# Patient Record
Sex: Female | Born: 1945 | Race: White | Hispanic: No | Smoking: Never smoker
Health system: Southern US, Community
[De-identification: ages and names within clinical notes are randomized; demographics above are authoritative.]

## PROBLEM LIST (undated history)

## (undated) ENCOUNTER — Inpatient Hospital Stay: Admission: EM | Payer: Self-pay | Source: Home / Self Care

## (undated) DIAGNOSIS — N390 Urinary tract infection, site not specified: Secondary | ICD-10-CM

## (undated) DIAGNOSIS — D649 Anemia, unspecified: Secondary | ICD-10-CM

## (undated) DIAGNOSIS — M5136 Other intervertebral disc degeneration, lumbar region: Secondary | ICD-10-CM

## (undated) DIAGNOSIS — R519 Headache, unspecified: Secondary | ICD-10-CM

## (undated) DIAGNOSIS — T8859XA Other complications of anesthesia, initial encounter: Secondary | ICD-10-CM

## (undated) DIAGNOSIS — F329 Major depressive disorder, single episode, unspecified: Secondary | ICD-10-CM

## (undated) DIAGNOSIS — Z9289 Personal history of other medical treatment: Secondary | ICD-10-CM

## (undated) DIAGNOSIS — M1990 Unspecified osteoarthritis, unspecified site: Secondary | ICD-10-CM

## (undated) DIAGNOSIS — K3184 Gastroparesis: Secondary | ICD-10-CM

## (undated) DIAGNOSIS — M4800 Spinal stenosis, site unspecified: Secondary | ICD-10-CM

## (undated) DIAGNOSIS — I10 Essential (primary) hypertension: Secondary | ICD-10-CM

## (undated) DIAGNOSIS — G709 Myoneural disorder, unspecified: Secondary | ICD-10-CM

## (undated) DIAGNOSIS — K219 Gastro-esophageal reflux disease without esophagitis: Secondary | ICD-10-CM

## (undated) DIAGNOSIS — E785 Hyperlipidemia, unspecified: Secondary | ICD-10-CM

## (undated) DIAGNOSIS — Z9889 Other specified postprocedural states: Secondary | ICD-10-CM

## (undated) DIAGNOSIS — G629 Polyneuropathy, unspecified: Secondary | ICD-10-CM

## (undated) DIAGNOSIS — T4145XA Adverse effect of unspecified anesthetic, initial encounter: Secondary | ICD-10-CM

## (undated) DIAGNOSIS — R262 Difficulty in walking, not elsewhere classified: Secondary | ICD-10-CM

## (undated) DIAGNOSIS — M51369 Other intervertebral disc degeneration, lumbar region without mention of lumbar back pain or lower extremity pain: Secondary | ICD-10-CM

## (undated) DIAGNOSIS — R51 Headache: Secondary | ICD-10-CM

## (undated) DIAGNOSIS — Z8719 Personal history of other diseases of the digestive system: Secondary | ICD-10-CM

## (undated) DIAGNOSIS — C801 Malignant (primary) neoplasm, unspecified: Secondary | ICD-10-CM

## (undated) DIAGNOSIS — R0602 Shortness of breath: Secondary | ICD-10-CM

## (undated) DIAGNOSIS — I639 Cerebral infarction, unspecified: Secondary | ICD-10-CM

## (undated) DIAGNOSIS — F32A Depression, unspecified: Secondary | ICD-10-CM

## (undated) DIAGNOSIS — R112 Nausea with vomiting, unspecified: Secondary | ICD-10-CM

## (undated) HISTORY — PX: EYE SURGERY: SHX253

## (undated) HISTORY — PX: KNEE SURGERY: SHX244

## (undated) HISTORY — PX: STAPEDECTOMY: SHX2435

## (undated) HISTORY — PX: UPPER GASTROINTESTINAL ENDOSCOPY: SHX188

## (undated) HISTORY — PX: ABDOMINAL HYSTERECTOMY: SHX81

## (undated) HISTORY — DX: Cerebral infarction, unspecified: I63.9

## (undated) HISTORY — PX: COLONOSCOPY: SHX174

## (undated) HISTORY — DX: Gastro-esophageal reflux disease without esophagitis: K21.9

## (undated) HISTORY — DX: Polyneuropathy, unspecified: G62.9

---

## 1997-05-29 ENCOUNTER — Other Ambulatory Visit: Admission: RE | Admit: 1997-05-29 | Discharge: 1997-05-29 | Payer: Self-pay | Admitting: Obstetrics and Gynecology

## 1997-08-26 ENCOUNTER — Ambulatory Visit (HOSPITAL_COMMUNITY): Admission: RE | Admit: 1997-08-26 | Discharge: 1997-08-26 | Payer: Self-pay | Admitting: Gastroenterology

## 1997-10-04 ENCOUNTER — Ambulatory Visit (HOSPITAL_COMMUNITY): Admission: RE | Admit: 1997-10-04 | Discharge: 1997-10-04 | Payer: Self-pay | Admitting: Family Medicine

## 1997-11-06 ENCOUNTER — Ambulatory Visit (HOSPITAL_BASED_OUTPATIENT_CLINIC_OR_DEPARTMENT_OTHER): Admission: RE | Admit: 1997-11-06 | Discharge: 1997-11-06 | Payer: Self-pay | Admitting: Orthopedic Surgery

## 1998-05-07 ENCOUNTER — Other Ambulatory Visit: Admission: RE | Admit: 1998-05-07 | Discharge: 1998-05-07 | Payer: Self-pay | Admitting: Obstetrics and Gynecology

## 1999-05-14 ENCOUNTER — Encounter: Payer: Self-pay | Admitting: Obstetrics and Gynecology

## 1999-05-14 ENCOUNTER — Encounter: Admission: RE | Admit: 1999-05-14 | Discharge: 1999-05-14 | Payer: Self-pay | Admitting: Obstetrics and Gynecology

## 2001-05-09 ENCOUNTER — Other Ambulatory Visit: Admission: RE | Admit: 2001-05-09 | Discharge: 2001-05-09 | Payer: Self-pay | Admitting: Obstetrics and Gynecology

## 2001-10-20 ENCOUNTER — Encounter (HOSPITAL_COMMUNITY): Admission: RE | Admit: 2001-10-20 | Discharge: 2001-11-19 | Payer: Self-pay | Admitting: Orthopedic Surgery

## 2001-11-17 ENCOUNTER — Encounter: Payer: Self-pay | Admitting: Orthopedic Surgery

## 2001-11-23 ENCOUNTER — Encounter: Payer: Self-pay | Admitting: Orthopedic Surgery

## 2001-11-23 ENCOUNTER — Encounter (HOSPITAL_COMMUNITY): Admission: RE | Admit: 2001-11-23 | Discharge: 2001-12-23 | Payer: Self-pay | Admitting: Orthopedic Surgery

## 2002-07-19 ENCOUNTER — Other Ambulatory Visit: Admission: RE | Admit: 2002-07-19 | Discharge: 2002-07-19 | Payer: Self-pay | Admitting: Obstetrics and Gynecology

## 2003-01-26 HISTORY — PX: JOINT REPLACEMENT: SHX530

## 2004-09-29 ENCOUNTER — Other Ambulatory Visit: Admission: RE | Admit: 2004-09-29 | Discharge: 2004-09-29 | Payer: Self-pay | Admitting: Obstetrics and Gynecology

## 2004-11-12 ENCOUNTER — Ambulatory Visit: Payer: Self-pay | Admitting: Orthopedic Surgery

## 2004-12-30 ENCOUNTER — Ambulatory Visit (HOSPITAL_COMMUNITY): Admission: RE | Admit: 2004-12-30 | Discharge: 2004-12-30 | Payer: Self-pay | Admitting: Pulmonary Disease

## 2007-04-10 ENCOUNTER — Emergency Department (HOSPITAL_COMMUNITY): Admission: EM | Admit: 2007-04-10 | Discharge: 2007-04-10 | Payer: Self-pay | Admitting: Emergency Medicine

## 2008-12-18 DIAGNOSIS — I10 Essential (primary) hypertension: Secondary | ICD-10-CM

## 2008-12-23 ENCOUNTER — Ambulatory Visit: Payer: Self-pay | Admitting: Orthopedic Surgery

## 2008-12-23 DIAGNOSIS — E119 Type 2 diabetes mellitus without complications: Secondary | ICD-10-CM

## 2008-12-23 DIAGNOSIS — M545 Low back pain: Secondary | ICD-10-CM

## 2008-12-23 DIAGNOSIS — M4800 Spinal stenosis, site unspecified: Secondary | ICD-10-CM

## 2008-12-23 DIAGNOSIS — M5430 Sciatica, unspecified side: Secondary | ICD-10-CM

## 2008-12-26 ENCOUNTER — Telehealth: Payer: Self-pay | Admitting: Orthopedic Surgery

## 2008-12-27 ENCOUNTER — Ambulatory Visit (HOSPITAL_COMMUNITY): Admission: RE | Admit: 2008-12-27 | Discharge: 2008-12-27 | Payer: Self-pay | Admitting: Orthopedic Surgery

## 2009-01-06 ENCOUNTER — Ambulatory Visit: Payer: Self-pay | Admitting: Orthopedic Surgery

## 2009-01-15 ENCOUNTER — Telehealth: Payer: Self-pay | Admitting: Orthopedic Surgery

## 2009-02-12 ENCOUNTER — Encounter: Payer: Self-pay | Admitting: Orthopedic Surgery

## 2009-02-25 HISTORY — PX: BACK SURGERY: SHX140

## 2009-02-27 ENCOUNTER — Inpatient Hospital Stay (HOSPITAL_COMMUNITY): Admission: RE | Admit: 2009-02-27 | Discharge: 2009-03-03 | Payer: Self-pay | Admitting: Neurosurgery

## 2009-03-31 ENCOUNTER — Encounter: Payer: Self-pay | Admitting: Orthopedic Surgery

## 2009-04-04 ENCOUNTER — Ambulatory Visit (HOSPITAL_COMMUNITY): Admission: RE | Admit: 2009-04-04 | Discharge: 2009-04-04 | Payer: Self-pay | Admitting: Pulmonary Disease

## 2009-05-07 ENCOUNTER — Encounter: Payer: Self-pay | Admitting: Orthopedic Surgery

## 2009-06-18 ENCOUNTER — Encounter: Payer: Self-pay | Admitting: Orthopedic Surgery

## 2009-07-02 ENCOUNTER — Ambulatory Visit (HOSPITAL_COMMUNITY): Admission: RE | Admit: 2009-07-02 | Discharge: 2009-07-02 | Payer: Self-pay | Admitting: Pulmonary Disease

## 2009-07-31 ENCOUNTER — Ambulatory Visit (HOSPITAL_COMMUNITY)
Admission: RE | Admit: 2009-07-31 | Discharge: 2009-07-31 | Payer: Self-pay | Source: Home / Self Care | Admitting: Pulmonary Disease

## 2009-09-22 ENCOUNTER — Encounter: Payer: Self-pay | Admitting: Orthopedic Surgery

## 2010-01-22 ENCOUNTER — Inpatient Hospital Stay (HOSPITAL_COMMUNITY)
Admission: EM | Admit: 2010-01-22 | Discharge: 2010-01-27 | Payer: Self-pay | Source: Home / Self Care | Attending: Pulmonary Disease | Admitting: Pulmonary Disease

## 2010-02-02 ENCOUNTER — Ambulatory Visit (HOSPITAL_COMMUNITY)
Admission: RE | Admit: 2010-02-02 | Discharge: 2010-02-02 | Payer: Self-pay | Source: Home / Self Care | Attending: Pulmonary Disease | Admitting: Pulmonary Disease

## 2010-02-06 ENCOUNTER — Encounter (HOSPITAL_COMMUNITY)
Admission: RE | Admit: 2010-02-06 | Discharge: 2010-02-24 | Payer: Self-pay | Source: Home / Self Care | Attending: Pulmonary Disease | Admitting: Pulmonary Disease

## 2010-02-24 NOTE — Letter (Signed)
Summary: [facility] office note Dr Leitha Bleak office note Dr Venetia Maxon   Imported By: Cammie Sickle 06/09/2009 08:54:08  _____________________________________________________________________  External Attachment:    Type:   Image     Comment:   External Document

## 2010-02-24 NOTE — Letter (Signed)
Summary: [facility] Office note Dr Leitha Bleak Office note Dr Venetia Maxon   Imported By: Cammie Sickle 10/15/2009 08:52:28  _____________________________________________________________________  External Attachment:    Type:   Image     Comment:   External Document

## 2010-02-24 NOTE — Consult Note (Signed)
Summary: Consult notes from Dr. Venetia Maxon  Consult notes from Dr. Venetia Maxon   Imported By: Jacklynn Ganong 04/09/2009 08:20:15  _____________________________________________________________________  External Attachment:    Type:   Image     Comment:   External Document

## 2010-02-24 NOTE — Letter (Signed)
Summary: [facility] office note Dr Leitha Bleak office note Dr Venetia Maxon   Imported By: Cammie Sickle 07/08/2009 09:04:49  _____________________________________________________________________  External Attachment:    Type:   Image     Comment:   External Document

## 2010-02-24 NOTE — Consult Note (Signed)
Summary: Consult Rpt [facility] Dr Venetia Maxon  Consult Rpt [facility] Dr Venetia Maxon   Imported By: Cammie Sickle 03/05/2009 19:24:31  _____________________________________________________________________  External Attachment:    Type:   Image     Comment:   External Document

## 2010-02-25 ENCOUNTER — Ambulatory Visit: Admit: 2010-02-25 | Payer: Self-pay | Admitting: Internal Medicine

## 2010-02-25 ENCOUNTER — Ambulatory Visit (INDEPENDENT_AMBULATORY_CARE_PROVIDER_SITE_OTHER): Payer: Medicare Other | Admitting: Internal Medicine

## 2010-02-25 DIAGNOSIS — R110 Nausea: Secondary | ICD-10-CM

## 2010-03-04 ENCOUNTER — Ambulatory Visit (HOSPITAL_COMMUNITY)
Admission: RE | Admit: 2010-03-04 | Discharge: 2010-03-04 | Disposition: A | Discharge status: Home / Self Care | Payer: Medicare Other | Source: Ambulatory Visit | Attending: Internal Medicine | Admitting: Internal Medicine

## 2010-03-04 ENCOUNTER — Encounter (HOSPITAL_BASED_OUTPATIENT_CLINIC_OR_DEPARTMENT_OTHER): Payer: Medicare Other | Admitting: Internal Medicine

## 2010-03-04 DIAGNOSIS — Z79899 Other long term (current) drug therapy: Secondary | ICD-10-CM | POA: Insufficient documentation

## 2010-03-04 DIAGNOSIS — R112 Nausea with vomiting, unspecified: Secondary | ICD-10-CM

## 2010-03-04 DIAGNOSIS — I10 Essential (primary) hypertension: Secondary | ICD-10-CM | POA: Insufficient documentation

## 2010-03-04 DIAGNOSIS — E119 Type 2 diabetes mellitus without complications: Secondary | ICD-10-CM | POA: Insufficient documentation

## 2010-03-04 DIAGNOSIS — E785 Hyperlipidemia, unspecified: Secondary | ICD-10-CM | POA: Insufficient documentation

## 2010-03-04 LAB — GLUCOSE, CAPILLARY: Glucose-Capillary: 147 mg/dL — ABNORMAL HIGH (ref 70–99)

## 2010-03-09 NOTE — Op Note (Signed)
  NAME:  Lori Mahoney, Lori Mahoney                 ACCOUNT NO.:  [id number]  MEDICAL RECORD NO.:  [id number]           PATIENT TYPE:  O  LOCATION:  DAYP                          FACILITY:  [facility]  PHYSICIAN:  Lionel December, M.D.    DATE OF BIRTH:  November 17, 1945  DATE OF PROCEDURE:  03/04/2010 DATE OF DISCHARGE:                              OPERATIVE REPORT   PROCEDURE:  Esophagogastroduodenoscopy.  INDICATION:  [name] is a 65 year old Caucasian female with history of diabetes mellitus, who has been having recurrent nausea and vomiting for about 6 weeks.  She was briefly hospitalized in December for dehydration and electrolyte abnormalities.  She has had negative ultrasound and negative HIDA with a CCK.  She was seen by Dr. Lovell Sheehan, who felt that she did not need to have her gallbladder removed.  She is undergoing diagnostic EGD.  Procedures were reviewed with the patient.  Informed consent was obtained.  MEDS FOR CONSCIOUS SEDATION:  Cetacaine spray for oropharyngeal topical anesthesia, Versed 4 mg IV, fentanyl 50 mcg IV.  FINDINGS:  Procedure performed in endoscopy suite.  The patient's vital signs and O2 sat were monitored during the procedure and remained stable.  The patient was placed in left lateral recumbent position and Pentax videoscope was passed through oropharynx without any difficulty into esophagus.  Esophagus.  Mucosa of the esophagus normal.  GE junction was located at 40 cm from the incisors and was unremarkable.  Stomach.  It was empty and distended very well with insufflation.  Folds of proximal stomach are normal.  Examination of mucosa at body, antrum, pyloric channel, as well as angularis, fundus, and cardia was normal.  Duodenum.  Bulbar mucosa was normal.  Mucosa and folds in the second part of the duodenum were also normal.  Endoscope was withdrawn.  The patient tolerated the procedure well.  FINAL DIAGNOSIS:  Normal esophagogastroduodenoscopy.  RECOMMENDATIONS: 1.  She will continue omeprazole at 20 mg b.i.d.  We will start on     metoclopramide 10 mg 30 minutes before each meal prescription given     for 42 doses without refill.  The patient's daughter informed of     potential side effects if she has any, she will immediately stop     the medication. 2. We will schedule her for solid phase gastric emptying study.  She     will have to stop her metoclopramide 24 hours before the procedure. 3. If her emptying study is normal, we will discontinue metoclopramide     and proceed with abdominopelvic CT.     Lionel December, M.D.     NR/MEDQ  D:  03/04/2010  T:  03/04/2010  Job:  [id number]  cc:   Dr. Juanetta Gosling  Dr. Lovell Sheehan  Electronically Signed by Lionel December M.D. on 03/09/2010 01:51:14 PM

## 2010-03-10 ENCOUNTER — Encounter (HOSPITAL_COMMUNITY): Payer: Self-pay

## 2010-03-10 ENCOUNTER — Encounter (HOSPITAL_COMMUNITY)
Admit: 2010-03-10 | Discharge: 2010-03-10 | Disposition: A | Discharge status: Home / Self Care | Payer: Medicare Other | Attending: Internal Medicine | Admitting: Internal Medicine

## 2010-03-10 DIAGNOSIS — R112 Nausea with vomiting, unspecified: Secondary | ICD-10-CM | POA: Insufficient documentation

## 2010-03-10 HISTORY — DX: Essential (primary) hypertension: I10

## 2010-03-10 HISTORY — DX: Malignant (primary) neoplasm, unspecified: C80.1

## 2010-03-10 MED ORDER — TECHNETIUM TC 99M SULFUR COLLOID
2.0000 | Freq: Once | INTRAVENOUS | Status: AC | PRN
  Administered 2010-03-10: 2.1 via ORAL

## 2010-03-25 ENCOUNTER — Encounter: Payer: Self-pay | Admitting: Orthopedic Surgery

## 2010-03-31 ENCOUNTER — Ambulatory Visit (INDEPENDENT_AMBULATORY_CARE_PROVIDER_SITE_OTHER): Payer: Medicare Other | Admitting: Internal Medicine

## 2010-03-31 DIAGNOSIS — R112 Nausea with vomiting, unspecified: Secondary | ICD-10-CM

## 2010-04-06 LAB — GLUCOSE, CAPILLARY
Glucose-Capillary: 174 mg/dL — ABNORMAL HIGH (ref 70–99)
Glucose-Capillary: 177 mg/dL — ABNORMAL HIGH (ref 70–99)
Glucose-Capillary: 181 mg/dL — ABNORMAL HIGH (ref 70–99)
Glucose-Capillary: 201 mg/dL — ABNORMAL HIGH (ref 70–99)
Glucose-Capillary: 254 mg/dL — ABNORMAL HIGH (ref 70–99)
Glucose-Capillary: 279 mg/dL — ABNORMAL HIGH (ref 70–99)
Glucose-Capillary: 280 mg/dL — ABNORMAL HIGH (ref 70–99)
Glucose-Capillary: 306 mg/dL — ABNORMAL HIGH (ref 70–99)

## 2010-04-06 LAB — OVA AND PARASITE EXAMINATION: Ova and parasites: NONE SEEN

## 2010-04-06 LAB — BASIC METABOLIC PANEL
BUN: 6 mg/dL (ref 6–23)
CO2: 24 mEq/L (ref 19–32)
CO2: 26 mEq/L (ref 19–32)
CO2: 27 mEq/L (ref 19–32)
CO2: 28 mEq/L (ref 19–32)
Calcium: 8 mg/dL — ABNORMAL LOW (ref 8.4–10.5)
Chloride: 100 mEq/L (ref 96–112)
Chloride: 101 mEq/L (ref 96–112)
Chloride: 106 mEq/L (ref 96–112)
Chloride: 107 mEq/L (ref 96–112)
Chloride: 94 mEq/L — ABNORMAL LOW (ref 96–112)
Creatinine, Ser: 0.87 mg/dL (ref 0.4–1.2)
Creatinine, Ser: 0.88 mg/dL (ref 0.4–1.2)
Creatinine, Ser: 0.95 mg/dL (ref 0.4–1.2)
Creatinine, Ser: 1.21 mg/dL — ABNORMAL HIGH (ref 0.4–1.2)
GFR calc Af Amer: 54 mL/min — ABNORMAL LOW (ref >60)
GFR calc Af Amer: >60 mL/min (ref >60)
GFR calc Af Amer: >60 mL/min (ref >60)
GFR calc Af Amer: >60 mL/min (ref >60)
Glucose, Bld: 267 mg/dL — ABNORMAL HIGH (ref 70–99)
Glucose, Bld: 273 mg/dL — ABNORMAL HIGH (ref 70–99)
Potassium: 2.5 mEq/L — CL (ref 3.5–5.1)
Potassium: 3.2 mEq/L — ABNORMAL LOW (ref 3.5–5.1)
Sodium: 134 mEq/L — ABNORMAL LOW (ref 135–145)
Sodium: 139 mEq/L (ref 135–145)

## 2010-04-06 LAB — STOOL CULTURE

## 2010-04-06 LAB — DIFFERENTIAL
Eosinophils Relative: 0 % (ref 0–5)
Lymphocytes Relative: 12 % (ref 12–46)
Lymphs Abs: 1.6 10*3/uL (ref 0.7–4.0)
Monocytes Relative: 6 % (ref 3–12)
Neutrophils Relative %: 82 % — ABNORMAL HIGH (ref 43–77)

## 2010-04-06 LAB — MRSA PCR SCREENING: MRSA by PCR: NEGATIVE

## 2010-04-06 LAB — CLOSTRIDIUM DIFFICILE BY PCR: Toxigenic C. Difficile by PCR: NEGATIVE

## 2010-04-06 LAB — CBC
Hemoglobin: 10.6 g/dL — ABNORMAL LOW (ref 12.0–15.0)
MCH: 29.1 pg (ref 26.0–34.0)
MCV: 86.3 fL (ref 78.0–100.0)
Platelets: 217 10*3/uL (ref 150–400)
RBC: 3.64 MIL/uL — ABNORMAL LOW (ref 3.87–5.11)
WBC: 13.4 10*3/uL — ABNORMAL HIGH (ref 4.0–10.5)

## 2010-04-07 NOTE — Letter (Signed)
Summary: [facility] office notes Dr Leitha Bleak office notes Dr Venetia Maxon   Imported By: Cammie Sickle 03/31/2010 19:26:19  _____________________________________________________________________  External Attachment:    Type:   Image     Comment:   External Document

## 2010-04-13 LAB — COMPREHENSIVE METABOLIC PANEL
Albumin: 3.8 g/dL (ref 3.5–5.2)
BUN: 12 mg/dL (ref 6–23)
Creatinine, Ser: 0.84 mg/dL (ref 0.4–1.2)
Total Protein: 7.4 g/dL (ref 6.0–8.3)

## 2010-04-13 LAB — TYPE AND SCREEN: Antibody Screen: NEGATIVE

## 2010-04-13 LAB — CBC
HCT: 35 % — ABNORMAL LOW (ref 36.0–46.0)
MCV: 87.9 fL (ref 78.0–100.0)
Platelets: 282 10*3/uL (ref 150–400)
RDW: 14.3 % (ref 11.5–15.5)

## 2010-04-16 LAB — GLUCOSE, CAPILLARY
Glucose-Capillary: 137 mg/dL — ABNORMAL HIGH (ref 70–99)
Glucose-Capillary: 154 mg/dL — ABNORMAL HIGH (ref 70–99)
Glucose-Capillary: 158 mg/dL — ABNORMAL HIGH (ref 70–99)
Glucose-Capillary: 163 mg/dL — ABNORMAL HIGH (ref 70–99)
Glucose-Capillary: 164 mg/dL — ABNORMAL HIGH (ref 70–99)
Glucose-Capillary: 165 mg/dL — ABNORMAL HIGH (ref 70–99)
Glucose-Capillary: 178 mg/dL — ABNORMAL HIGH (ref 70–99)
Glucose-Capillary: 208 mg/dL — ABNORMAL HIGH (ref 70–99)

## 2010-07-07 ENCOUNTER — Ambulatory Visit (INDEPENDENT_AMBULATORY_CARE_PROVIDER_SITE_OTHER): Payer: Medicare Other | Admitting: Internal Medicine

## 2010-07-07 DIAGNOSIS — R112 Nausea with vomiting, unspecified: Secondary | ICD-10-CM

## 2010-07-07 DIAGNOSIS — Z862 Personal history of diseases of the blood and blood-forming organs and certain disorders involving the immune mechanism: Secondary | ICD-10-CM

## 2010-07-07 DIAGNOSIS — I959 Hypotension, unspecified: Secondary | ICD-10-CM

## 2010-10-08 ENCOUNTER — Telehealth: Payer: Self-pay | Admitting: Orthopedic Surgery

## 2010-10-08 NOTE — Telephone Encounter (Signed)
Patient requests appointment for right hip pain, radiating downward. Had been referred for, and had back surgery at [facility]. States all is great with her back. States that she spoke with Dr. Romeo Apple when she saw him recently and he said okay to schedule for the hip.  Regular appointment or 30 minute slot?  Her ph# is [phone]

## 2010-10-08 NOTE — Telephone Encounter (Signed)
Make reg appt with xray

## 2010-10-08 NOTE — Telephone Encounter (Signed)
Called back to patient and appointment

## 2010-10-19 LAB — BASIC METABOLIC PANEL
CO2: 30
Calcium: 9.1
GFR calc Af Amer: >60
GFR calc non Af Amer: >60
Sodium: 140

## 2010-10-19 LAB — TROPONIN I: Troponin I: 0.01

## 2010-10-19 LAB — CBC
Hemoglobin: 11.7 — ABNORMAL LOW
MCHC: 34.5
RBC: 3.92
WBC: 10.6 — ABNORMAL HIGH

## 2010-10-19 LAB — POCT CARDIAC MARKERS
CKMB, poc: <1 — ABNORMAL LOW
Operator id: 213931
Troponin i, poc: <0.05

## 2010-10-19 LAB — CK TOTAL AND CKMB (NOT AT ARMC)
CK, MB: 0.6
Total CK: 36

## 2010-10-20 ENCOUNTER — Ambulatory Visit (INDEPENDENT_AMBULATORY_CARE_PROVIDER_SITE_OTHER): Payer: Medicare Other | Admitting: Orthopedic Surgery

## 2010-10-20 ENCOUNTER — Encounter: Payer: Self-pay | Admitting: Orthopedic Surgery

## 2010-10-20 VITALS — BP 144/98 | Ht 63.0 in | Wt 211.0 lb

## 2010-10-20 DIAGNOSIS — M25559 Pain in unspecified hip: Secondary | ICD-10-CM

## 2010-10-20 DIAGNOSIS — M5137 Other intervertebral disc degeneration, lumbosacral region: Secondary | ICD-10-CM

## 2010-10-20 DIAGNOSIS — M5136 Other intervertebral disc degeneration, lumbar region: Secondary | ICD-10-CM | POA: Insufficient documentation

## 2010-10-20 NOTE — Patient Instructions (Signed)
See Neurosurgeon

## 2010-10-20 NOTE — Progress Notes (Signed)
Chief complaint pain RIGHT hip  65 year old female had an L3-S1 fusion and decompression in 2011 presents with RIGHT hip pain complaint with occasional radiation into the RIGHT leg.  The pain is located in the RIGHT sacral and gluteal region.  The pain started several months ago came on gradually.  She received a lumbar spine injection at the neurosurgeons office which didn't help she did not go back.  She was concerned about the cost.  She complains of throbbing 8/10 constant pain improved with heat and worse with activity associated with tingling.  Review of systems fatigue blurred vision shortness of breath, heartburn, weakness of the RIGHT lower extremity, tingling and unsteady gait related to the RIGHT lower extremity.  Medical history of hypertension diabetes and neuropathy  Status post lumbar fusion along with a previous hysterectomy LEFT knee replacement RIGHT knee patella elevation for arthritis.  3 ear surgeries as well.  Primary physician Dr. Kari Baars  Medications as recorded.  Family history of arthritis and cancer.  Social history married retired does not smoke or drink  Exam vital signs as recorded General appearance the knees of endomorphic body habitus patient appears to be uncomfortable.  Hygiene normal , grooming-moderate Oriented x3 Mood and affect flat Ambulates with poor stride length decreased stride speed.  RIGHT hip exam and back exam.  She is tender in the middle of her back at L4 and 5 and L5-S1 and also on both SI joints.  She has no groin pain.  He has normal range of motion in her RIGHT hip and her RIGHT hip is stable.  She appears to have some hip flexion weakness compared RIGHT to LEFT extension, ankle flexion plantar and dorsal normal strength.  Skin intact.  Pulses in temperature RIGHT leg normal with no edema.  No sensory loss in the RIGHT leg.  No pathologic reflexes RIGHT leg.  Imaging AP and lateral RIGHT hip show normal joint space and contour of the  RIGHT femoral head.  Hip x-ray normal  Impression residual back pain after a lumbar fusion recommend she see her neurosurgeon  Separate x-ray report AP and lateral RIGHT hip Reason for x-ray hip pain  Findings normal hip joint space.  Normal femoral head.  No surrounding osteophytes.  Impression normal hip  Copy Dr. Venetia Maxon

## 2010-11-03 ENCOUNTER — Encounter (INDEPENDENT_AMBULATORY_CARE_PROVIDER_SITE_OTHER): Payer: Self-pay | Admitting: *Deleted

## 2010-11-12 ENCOUNTER — Ambulatory Visit (INDEPENDENT_AMBULATORY_CARE_PROVIDER_SITE_OTHER): Payer: Medicare Other | Admitting: Internal Medicine

## 2010-11-26 ENCOUNTER — Other Ambulatory Visit (HOSPITAL_COMMUNITY): Payer: Self-pay | Admitting: Pulmonary Disease

## 2010-11-26 ENCOUNTER — Ambulatory Visit (HOSPITAL_COMMUNITY)
Admission: RE | Admit: 2010-11-26 | Discharge: 2010-11-26 | Disposition: A | Discharge status: Home / Self Care | Payer: Medicare Other | Source: Ambulatory Visit | Attending: Pulmonary Disease | Admitting: Pulmonary Disease

## 2010-11-26 DIAGNOSIS — M25569 Pain in unspecified knee: Secondary | ICD-10-CM | POA: Insufficient documentation

## 2010-11-26 DIAGNOSIS — M25561 Pain in right knee: Secondary | ICD-10-CM

## 2010-11-27 ENCOUNTER — Encounter (INDEPENDENT_AMBULATORY_CARE_PROVIDER_SITE_OTHER): Payer: Self-pay | Admitting: *Deleted

## 2010-11-27 ENCOUNTER — Encounter (INDEPENDENT_AMBULATORY_CARE_PROVIDER_SITE_OTHER): Payer: Self-pay | Admitting: Internal Medicine

## 2010-12-29 ENCOUNTER — Ambulatory Visit (INDEPENDENT_AMBULATORY_CARE_PROVIDER_SITE_OTHER): Payer: Medicare Other | Admitting: Internal Medicine

## 2010-12-29 ENCOUNTER — Encounter (INDEPENDENT_AMBULATORY_CARE_PROVIDER_SITE_OTHER): Payer: Self-pay | Admitting: Internal Medicine

## 2010-12-29 DIAGNOSIS — R112 Nausea with vomiting, unspecified: Secondary | ICD-10-CM | POA: Insufficient documentation

## 2010-12-29 NOTE — Patient Instructions (Addendum)
Keep naproxen use to no more than 2 pills per day(220 mg). Use  oxycodone on as-needed basis

## 2011-01-04 NOTE — Progress Notes (Signed)
Presenting complaint; Followup for nausea and vomiting. Subjective:* Lori Mahoney is 65 year old Caucasian female patient of Dr. Juanetta Gosling was here for six-month follow-up. She states she did well for a few months but for the last 6-8 weeks she has had multiple spells of nausea and vomiting. She has not experienced hematemesis or melena. She generally vomits the food that she has has recently ingested. She has gained 5 pounds since her last visit. She is concerned that pain medication may be causing these symptoms. She has severe right knee pain due to arthritis as well as peripheral neuropathy and she cannot function unless  She takes her naproxen.  Current Medications: Current Outpatient Prescriptions  Medication Sig Dispense Refill  . gabapentin (NEURONTIN) 300 MG capsule 300 mg 4 (four) times daily.       Marland Kitchen glyBURIDE (DIABETA) 5 MG tablet 15 mg. Patient is taking 2 in the morning and 1 at night      . naproxen sodium (ANAPROX) 220 MG tablet Take 220 mg by mouth daily.        . pantoprazole (PROTONIX) 40 MG tablet Take 40 mg by mouth daily.       . pravastatin (PRAVACHOL) 40 MG tablet Take 40 mg by mouth daily.        . verapamil (CALAN-SR) 240 MG CR tablet Take 240 mg by mouth at bedtime.         Objective: BP 142/78  Pulse 80  Temp(Src) 98.4 F (36.9 C) (Oral)  Resp 14  Ht 5\' 3"  (1.6 m)  Wt 214 lb (97.07 kg)  BMI 37.91 kg/m2  Conjunctiva is pink. Sclera is nonicteric Oral pharyngeal mucosa is normal. No neck masses or thyromegaly noted. Cardiac exam with regular rhythm normal S1 and S2. No murmur or gallop noted. Lungs are clear to auscultation. Abdomen is soft and nontender without organomegaly or masses. No LE edema or clubbing noted.     Labs/studies Results: LFTs from 11/26/2010. Alkaline phosphatase mildly elevated at 141. Bili oh 0.5, AST 11, ALT less than 8 and albumin 4.2. Hemoglobin A1c 7.6. Assessment: #1. Recurrent nausea and vomiting. Earlier this year she had  extensive workup which was all negative. Gastroparesis was suspected but GES was normal. 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 She may want to check with Dr. Juanetta Gosling if fentanyl patch would be worth trying   Plan: Patient advised to keep naproxen use to no more than 2 tablets per day. Use  oxycodone on as-needed basis. Check with Dr. Juanetta Gosling if fentanyl patch could be tried. Office visit in 6 months unless symptoms progress.

## 2011-06-30 ENCOUNTER — Encounter (INDEPENDENT_AMBULATORY_CARE_PROVIDER_SITE_OTHER): Payer: Self-pay | Admitting: *Deleted

## 2011-07-12 ENCOUNTER — Ambulatory Visit (INDEPENDENT_AMBULATORY_CARE_PROVIDER_SITE_OTHER): Payer: Medicare Other | Admitting: Internal Medicine

## 2011-08-04 ENCOUNTER — Ambulatory Visit (INDEPENDENT_AMBULATORY_CARE_PROVIDER_SITE_OTHER): Payer: Medicare Other | Admitting: Orthopedic Surgery

## 2011-08-04 ENCOUNTER — Encounter: Payer: Self-pay | Admitting: Orthopedic Surgery

## 2011-08-04 VITALS — Ht 63.0 in | Wt 202.0 lb

## 2011-08-04 DIAGNOSIS — G8929 Other chronic pain: Secondary | ICD-10-CM

## 2011-08-04 NOTE — Patient Instructions (Signed)
We will make referral to Pain management center

## 2011-08-04 NOTE — Progress Notes (Signed)
Patient ID: Lori Mahoney, female   DOB: 1945/09/09, 66 y.o.   MRN: [id number] Chief Complaint  Patient presents with  . Follow-up    Recheck right hip    There were no vitals taken for this visit.  Pain in the RIGHT hip by Dr. Roseanna Rainbow, RIGHT leg.  Status post lumbar fusion.  The patient was functioning well. Prior to Christmas since that time. She fell, landed on her RIGHT side, and has not been able to ambulate in the store or shop. She cannot stand for a long period of time without severe pain. She is intolerant to codeine and Demerol, and intolerant to oxycodone. She is having severe pain.  Clinical exam shows normal rotation of her hip without any discomfort. She has no groin tenderness or pain. She has equal leg lengths and no hip pathology,  She's tender in the RIGHT groin and in . The lumbar spine. Her incision is well-healed. She has no increase in muscle tone. She has no vascular deficits.  Impression chronic pain  recommend she see her spine surgeon to evaluate the fusion. If that is normal, then, chronic pain management, and we've made that referral

## 2011-08-06 ENCOUNTER — Other Ambulatory Visit: Payer: Self-pay | Admitting: *Deleted

## 2011-08-06 DIAGNOSIS — G8929 Other chronic pain: Secondary | ICD-10-CM

## 2011-08-10 ENCOUNTER — Encounter: Payer: Self-pay | Admitting: *Deleted

## 2011-08-13 ENCOUNTER — Telehealth: Payer: Self-pay | Admitting: Orthopedic Surgery

## 2011-08-13 NOTE — Telephone Encounter (Signed)
Patient called to relay that she has followed up with neurosurgeon, Dr. Venetia Maxon, regarding her back, as she has had previous back surgery by him.  Asking if she may therefore hold on the referral that Dr. Romeo Apple had made for her to go to pain management in [place].  She said she will be scheduled for surgery in early August, when Dr. Venetia Maxon returns from vacation.  Please advise.  Her phone # is [phone].

## 2011-08-14 NOTE — Telephone Encounter (Signed)
Yes

## 2011-08-16 NOTE — Telephone Encounter (Addendum)
Called back to patient to relay that Dr. Romeo Apple advises to wait on referral as noted. Left message on machine to call back.   * Patient returned call.  Done.

## 2011-08-19 ENCOUNTER — Other Ambulatory Visit: Payer: Self-pay | Admitting: Neurosurgery

## 2011-08-19 DIAGNOSIS — M48061 Spinal stenosis, lumbar region without neurogenic claudication: Secondary | ICD-10-CM

## 2011-08-23 ENCOUNTER — Ambulatory Visit
Admission: RE | Admit: 2011-08-23 | Discharge: 2011-08-23 | Disposition: A | Discharge status: Home / Self Care | Payer: Medicare Other | Source: Ambulatory Visit | Attending: Neurosurgery | Admitting: Neurosurgery

## 2011-08-23 VITALS — BP 153/74 | HR 73

## 2011-08-23 DIAGNOSIS — M5430 Sciatica, unspecified side: Secondary | ICD-10-CM

## 2011-08-23 DIAGNOSIS — M48061 Spinal stenosis, lumbar region without neurogenic claudication: Secondary | ICD-10-CM

## 2011-08-23 DIAGNOSIS — M545 Low back pain: Secondary | ICD-10-CM

## 2011-08-23 DIAGNOSIS — M4800 Spinal stenosis, site unspecified: Secondary | ICD-10-CM

## 2011-08-23 DIAGNOSIS — M5136 Other intervertebral disc degeneration, lumbar region: Secondary | ICD-10-CM

## 2011-08-23 MED ORDER — IOHEXOL 180 MG/ML  SOLN
15.0000 mL | Freq: Once | INTRAMUSCULAR | Status: AC | PRN
  Administered 2011-08-23: 15 mL via INTRATHECAL

## 2011-08-23 MED ORDER — ONDANSETRON HCL 4 MG/2ML IJ SOLN: 4.0000 mg | Freq: Four times a day (QID) | INTRAMUSCULAR | Status: DC | PRN

## 2011-08-23 MED ORDER — DIAZEPAM 5 MG PO TABS
5.0000 mg | ORAL_TABLET | Freq: Once | ORAL | Status: AC
  Administered 2011-08-23: 5 mg via ORAL

## 2011-08-25 ENCOUNTER — Telehealth: Payer: Self-pay | Admitting: Radiology

## 2011-08-25 ENCOUNTER — Other Ambulatory Visit: Payer: Medicare Other

## 2011-08-25 NOTE — Telephone Encounter (Signed)
Pt states she has been vomiting for the past 2 days. Only had valium here but has been on antibiotic eye drops for a week. Question if this could be the cause. Will follow up with dr. And check with her in the am.

## 2011-09-02 ENCOUNTER — Other Ambulatory Visit: Payer: Self-pay | Admitting: Neurosurgery

## 2011-09-02 ENCOUNTER — Encounter (HOSPITAL_COMMUNITY): Payer: Self-pay | Admitting: Pharmacy Technician

## 2011-09-09 ENCOUNTER — Encounter (HOSPITAL_COMMUNITY): Payer: Self-pay

## 2011-09-09 ENCOUNTER — Encounter (HOSPITAL_COMMUNITY)
Admission: RE | Admit: 2011-09-09 | Discharge: 2011-09-09 | Disposition: A | Discharge status: Home / Self Care | Payer: Medicare Other | Source: Ambulatory Visit | Attending: Neurosurgery | Admitting: Neurosurgery

## 2011-09-09 HISTORY — DX: Unspecified osteoarthritis, unspecified site: M19.90

## 2011-09-09 HISTORY — DX: Anemia, unspecified: D64.9

## 2011-09-09 HISTORY — DX: Other complications of anesthesia, initial encounter: T88.59XA

## 2011-09-09 HISTORY — DX: Shortness of breath: R06.02

## 2011-09-09 HISTORY — DX: Personal history of other medical treatment: Z92.89

## 2011-09-09 HISTORY — DX: Myoneural disorder, unspecified: G70.9

## 2011-09-09 HISTORY — DX: Personal history of other diseases of the digestive system: Z87.19

## 2011-09-09 HISTORY — DX: Other specified postprocedural states: Z98.890

## 2011-09-09 HISTORY — DX: Adverse effect of unspecified anesthetic, initial encounter: T41.45XA

## 2011-09-09 HISTORY — DX: Nausea with vomiting, unspecified: R11.2

## 2011-09-09 LAB — BASIC METABOLIC PANEL
BUN: 12 mg/dL (ref 6–23)
Creatinine, Ser: 0.68 mg/dL (ref 0.50–1.10)
GFR calc non Af Amer: 89 mL/min — ABNORMAL LOW (ref >90)
Glucose, Bld: 198 mg/dL — ABNORMAL HIGH (ref 70–99)
Potassium: 3.3 mEq/L — ABNORMAL LOW (ref 3.5–5.1)

## 2011-09-09 LAB — TYPE AND SCREEN

## 2011-09-09 LAB — CBC
HCT: 39.3 % (ref 36.0–46.0)
Hemoglobin: 12.9 g/dL (ref 12.0–15.0)
MCHC: 32.8 g/dL (ref 30.0–36.0)
MCV: 85.6 fL (ref 78.0–100.0)
WBC: 10.9 10*3/uL — ABNORMAL HIGH (ref 4.0–10.5)

## 2011-09-09 NOTE — Pre-Procedure Instructions (Signed)
20 Lori Mahoney  09/09/2011   Your procedure is scheduled on:  09/14/2011  Report to [facility] at 9:15 AM.  Call this number if you have problems the morning of surgery: [phone]   Remember:   Do not eat food or drink liquid :After Midnight. On Monday      Take these medicines the morning of surgery with A SIP OF WATER: EQUATE anti-reflux   Do not wear jewelry, make-up or nail polish.  Do not wear lotions, powders, or perfumes. You may wear deodorant.  Do not shave 48 hours prior to surgery. Men may shave face and neck.  Do not bring valuables to the hospital.  Contacts, dentures or bridgework may not be worn into surgery.  Leave suitcase in the car. After surgery it may be brought to your room.  For patients admitted to the hospital, checkout time is 11:00 AM the day of discharge.   Patients discharged the day of surgery will not be allowed to drive home.  Name and phone number of your driver: /w family  Special Instructions: CHG Shower Use Special Wash: 1/2 bottle night before surgery and 1/2 bottle morning of surgery.   Please read over the following fact sheets that you were given: Pain Booklet, Coughing and Deep Breathing, Blood Transfusion Information, MRSA Information and Surgical Site Infection Prevention

## 2011-09-09 NOTE — Progress Notes (Signed)
Pt. Reports EKG- done last 2011.  Had a stress & echo about 10 yrs. Ago, told no need for F/U

## 2011-09-09 NOTE — Progress Notes (Signed)
L eye- blood clot behind L eye, seeing Dr. Champ Mungo, getting injections & laser surgeries

## 2011-09-13 MED ORDER — CEFAZOLIN SODIUM-DEXTROSE 2-3 GM-% IV SOLR
2.0000 g | INTRAVENOUS | Status: DC
  Filled 2011-09-13: qty 50

## 2011-09-13 NOTE — H&P (Signed)
Lori Mahoney  #[id number] DOB:  Sep 26, 1945 09/01/2011:     Lori Mahoney comes back today with her husband to discuss her myelogram.  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  I believe this is the basis for her significant pain.  Her remaining levels appear to be well healed with well positioned hardware without complicating features.    She is in miserable pain and wants to go ahead and get this taken care of. I have recommended we go ahead with a re-do decompression and posterior fusion L2-3 level.  We will plan on doing this on 09/14/2011.  Risks and benefits were discussed with the patient.  She wishes to proceed.           Lori Mahoney. Venetia Maxon, M.D./gde  Lori Mahoney  [phone]  DOB:  Jan 17, 1946  08/11/2011:  Lori Mahoney returns today.  She has had progressive collapse of the L2-3 level to the right with progressive scoliosis as identified on plain radiographs.  Dr. Romeo Apple from orthopaedics saw her and felt that this was coming from her back and had her come to see me today.  She has right hip pain into her right thigh and she has difficulty raising her left and has significant hip flexor weakness on the right.  She has also been complaining of some right knee pain and is status post bone graft in the 1970's.  She is not able to get in any position of comfort.  She took Naproxen and said it did not help her.  She is taking Oxycodone which causes nausea and vomiting.  She says that the Fentanyl Patches burn her skin.  She is scheduled for cataract surgery next Tuesday.  She was not able to tolerate the Lyrica.  She got no relief with the SI joint injection and this was performed by Dr. Ollen Bowl.    At this point, give her hip flexor weakness with L2 radiculopathy and significant progressive scoliosis in her lumbar spine, I think we need to proceed with a myelogram and postmyelographic CT scan to better  clarify the nerve root compression.  I am going to go ahead and do that and I will see her back after that has been done.  Normally, I would do the myelogram, but I will not be in town for the next couple of weeks and I think we should go ahead with a more expedited evaluation.  I will make further recommendations at that point.  I think she will likely benefit from anterolateral decompression and fusion surgery at the L2-3 level, but this will depend on the results of the imaging studies.  Georgiann Cocker, my nurse, went over the specifics of that surgery and recovery related to that.          Lori Mahoney. Venetia Maxon, M.D./sv NEUROSURGICAL CONSULTATION   Lori Mahoney  DOB:  1945/09/07 #[id number]    February 13, 2008   HISTORY:     Lori Mahoney is a 66 year old retired woman who used to work in the [employer] who presents at the request of Dr. Romeo Apple for neurosurgical consultation with left hip and left leg pain, along with numbness into both of her feet, which she says has been significantly progressive since November of 2010.  She has had a few falls in the last month and landed on her left side. She notes loss of balance with neck  and head extension. She has been taking two to three Hydrocodone per day and also Gabapentin 300 mg. two twice daily.    She is unable to have an MRI due to stapedectomy surgeries and was to have neck surgery by DrLynnette Caffey years ago, but she had an MRI which caused a wire in the stapes to move and made this nonfunctional.  Dr. Jed Limerick has advised no further MRIs to be performed.    REVIEW OF SYSTEMS:   A detailed Review of Systems sheet was reviewed with the patient.  Pertinent positives include wears glasses/contacts, glaucoma, cataracts, hearing aid, hearing loss, balance disturbance, nasal congestion/drainage, sinus problems, sinus headache, high blood pressure, high cholesterol, swelling in feet, leg pain while walking, leg weakness, back pain, leg pain, joint pain  or swelling, arthritis, diabetes, and anemia.  All other systems are negative; this includes Constitutional symptoms, Respiratory, Gastrointestinal, Genitourinary, Integumentary & Breast, Neurologic, Psychiatric, Lymphatic, Allergic/Immunologic.    PAST MEDICAL HISTORY:      Current Medical Conditions:    Additional medical problems include hypertension, skin cancer of the lower lip, non-insulin dependent diabetes for which she has borne of the diagnosis for the last four years.      Prior Operations and Hospitalizations:   Left total knee replacement, stapedectomy in the 1990s, along with hysterectomy and revision of stapedectomy in 1992.      Medications and Allergies:  She is ALLERGIC TO CODEINE WHICH CAUSES EXTREME NAUSEA AND DEMEROL WHICH CAUSES THE SAME.  Current medications - Hydrocodone 5/500 one po q4h prn, Gabapentin 300 mg. two bid, Janumet 50/500 bid, Torsemide 240 mg. qd, and D3 1000 mg. bid.    Height and Weight:     She is 5'3 " tall, 204 lbs.    FAMILY HISTORY:    Both parents deceased. There is a family history of stroke, diabetes, colon cancer, breast cancer, and facial cancer in a brother.    SOCIAL HISTORY:    She denies tobacco, alcohol or drug use.    DIAGNOSTIC STUDIES:   She had a CT scan of her lumbar spine which was performed through [name] on 12/27/2008, which demonstrates significant levoconvex scoliosis and transitional segment of S1.  There is spondylolisthesis as well as marked spinal stenosis in the lateral recess, left greater than right at the L3-4 level, with a very large bone spur causing marked spinal stenosis at the L3-4 level. This spur is greater on the left than the right.  At the L4-5 level there is a prominent disc degeneration with subchondral gas formation, broad-based disc, and osteophyte complex with marked facet degenerative changes and multifactorial spinal stenosis, as well as scoliotic malalignment of this level.  At L5-S1 she  has disc degeneration, more notable on the left, with marked left foraminal narrowing and mass effect on the left L5 nerve root.    PHYSICAL EXAMINATION:      General Appearance:   On examination today, [name] is a pleasant and cooperative, obese woman in no acute distress.     Blood Pressure, Pulse:     Her blood pressure is 160/88.  Heart rate is 74 and regular.      HEENT - normocephalic, atraumatic.  The pupils are equal, round and reactive to light.  The extraocular muscles are intact.  Sclerae - white.  Conjunctiva - pink.  Oropharynx benign.  Uvula midline.     Neck - there are no masses, meningismus, deformities, tracheal deviation, jugular vein distention  or carotid bruits.  There is normal cervical range of motion.  Spurlings' test is negative without reproducible radicular pain turning the patient's head to either side.  Lhermitte's sign is not present with axial compression.      Respiratory - there is normal respiratory effort with good intercostal function.  Lungs are clear to auscultation.  There are no rales, rhonchi or wheezes.      Cardiovascular - the heart has regular rate and rhythm to auscultation.  No murmurs are appreciated.  There is no extremity edema, cyanosis or clubbing.  There are palpable pedal pulses.     Abdomen - obese, soft, nontender, no hepatosplenomegaly appreciated or masses.  There are active bowel sounds.  No guarding or rebound.      Musculoskeletal Examination - she notes pain radiating into her left knee and left shin, and a sensation of swelling into her left leg.  She has pain at the lumbosacral junction and both sciatic notches. She is able to stand on her heels and toes. She has left greater than right-sided positive straight leg raise.      NEUROLOGICAL EXAMINATION: The patient is oriented to time, person and place and has good recall of both recent and remote memory with normal attention span and concentration.  The patient speaks with clear  and fluent speech and exhibits normal language function and appropriate fund of knowledge.      Cranial Nerve Examination - pupils are equal, round and reactive to light.  Extraocular movements are full.  Visual fields are full to confrontational testing.  Facial sensation and facial movement are symmetric and intact.  She has decreased hearing in both ears.  Palate is upgoing.  Shoulder shrug is symmetric.  Tongue protrudes in the midline.      Motor Examination - motor strength is 5/5 in the bilateral deltoids, biceps, triceps, handgrips, wrist extensors, interosseous.  In the lower extremities motor strength is 5/5 in hip flexion, extension, quadriceps, hamstrings, plantar flexion, dorsiflexion and extensor hallucis longus, with mild extensor hallucis longus weakness on the left.  She has difficulty squatting on her left leg and this is difficult to asses whether this is secondary to her prior knee replacement or to some weakness in her left lower extremity secondary to nerve root compression.      Sensory Examination - she notes decreased pin sensation in her left lower extremity compared to the right.       Deep Tendon Reflexes - 2 in the biceps, triceps, and brachioradialis, 2 in the knees, 1 in the ankles.  The great toes are downgoing to plantar stimulation.      Cerebellar Examination - normal coordination in upper and lower extremities and normal rapid alternating movements.  Romberg test is negative.    IMPRESSION AND RECOMMENDATIONS: Braylon Mahoney is a 66 year old woman with marked spinal stenosis at L3-4 with a very large osteophyte causing marked nerve root compression. In addition, she has levoconvex scoliosis with marked foraminal stenosis at L5-S1 on the left and with marked disc degeneration and malalignment at the L4-5 level.    I spoke at length with [name] about her treatment options. I do not think that there are any viable nonsurgical options and she says she is hurting so  much that she needs to get something   done to give her some relief of her pain.  Because of the extremely large size of the bone spur at the L3-4 level, this is going to require a  wide decompression and facetectomy to be able to mobilize the neural elements sufficiently to be able to remove this spur.  It is possible that decompression alone will open her spinal canal sufficiently, but I am not confident of that and would like to be able to remove the bone spur. We also talked at length because of the severity of the nerve root compression and the very large size of the bone spur that it is quite possible that she may develop a spinal fluid leak with this surgery and we discussed the treatment for that.  I do not think that it is possible to do the decompression and stabilization secondary to the spondylolisthesis and scoliosis without fusion.  Surgery will consist of L3 through S1 decompression and fusion. She was fitted for an TLSO.  We answered her questions as to risks and benefits of surgery, and she wishes to proceed.    [facility]    Lori Mahoney. Venetia Maxon, M.D.

## 2011-09-14 ENCOUNTER — Encounter (HOSPITAL_COMMUNITY): Payer: Self-pay | Admitting: Critical Care Medicine

## 2011-09-14 ENCOUNTER — Inpatient Hospital Stay (HOSPITAL_COMMUNITY)
Admission: RE | Admit: 2011-09-14 | Discharge: 2011-09-22 | DRG: 460 | Disposition: A | Discharge status: Home / Self Care | Payer: Medicare Other | Source: Ambulatory Visit | Attending: Neurosurgery | Admitting: Neurosurgery

## 2011-09-14 ENCOUNTER — Encounter (HOSPITAL_COMMUNITY): Payer: Self-pay

## 2011-09-14 ENCOUNTER — Encounter (HOSPITAL_COMMUNITY)
Admission: RE | Disposition: A | Discharge status: Home / Self Care | Payer: Self-pay | Source: Ambulatory Visit | Attending: Neurosurgery

## 2011-09-14 ENCOUNTER — Inpatient Hospital Stay (HOSPITAL_COMMUNITY): Payer: Medicare Other

## 2011-09-14 ENCOUNTER — Inpatient Hospital Stay (HOSPITAL_COMMUNITY): Payer: Medicare Other | Admitting: Critical Care Medicine

## 2011-09-14 DIAGNOSIS — M4120 Other idiopathic scoliosis, site unspecified: Secondary | ICD-10-CM | POA: Diagnosis present

## 2011-09-14 DIAGNOSIS — E119 Type 2 diabetes mellitus without complications: Secondary | ICD-10-CM | POA: Diagnosis present

## 2011-09-14 DIAGNOSIS — Z96659 Presence of unspecified artificial knee joint: Secondary | ICD-10-CM

## 2011-09-14 DIAGNOSIS — Z833 Family history of diabetes mellitus: Secondary | ICD-10-CM

## 2011-09-14 DIAGNOSIS — I10 Essential (primary) hypertension: Secondary | ICD-10-CM | POA: Diagnosis present

## 2011-09-14 DIAGNOSIS — M5126 Other intervertebral disc displacement, lumbar region: Principal | ICD-10-CM | POA: Diagnosis present

## 2011-09-14 SURGERY — POSTERIOR LUMBAR FUSION 1 LEVEL
Anesthesia: General | Site: Back | Wound class: Clean

## 2011-09-14 MED ORDER — GLYBURIDE 5 MG PO TABS
5.0000 mg | ORAL_TABLET | Freq: Two times a day (BID) | ORAL | Status: DC
  Administered 2011-09-15 – 2011-09-22 (×14): 5 mg via ORAL
  Filled 2011-09-14 (×17): qty 1

## 2011-09-14 MED ORDER — ACIDOPHILUS PO CAPS
1.0000 | ORAL_CAPSULE | Freq: Every day | ORAL | Status: DC
  Filled 2011-09-14 (×2): qty 1

## 2011-09-14 MED ORDER — ACETAMINOPHEN 325 MG PO TABS: 650.0000 mg | ORAL_TABLET | ORAL | Status: DC | PRN

## 2011-09-14 MED ORDER — MIDAZOLAM HCL 5 MG/5ML IJ SOLN
INTRAMUSCULAR | Status: DC | PRN
  Administered 2011-09-14: 2 mg via INTRAVENOUS

## 2011-09-14 MED ORDER — BUPIVACAINE HCL (PF) 0.5 % IJ SOLN
INTRAMUSCULAR | Status: DC | PRN
  Administered 2011-09-14: 6.5 mL

## 2011-09-14 MED ORDER — SENNA 8.6 MG PO TABS
1.0000 | ORAL_TABLET | Freq: Two times a day (BID) | ORAL | Status: DC
  Administered 2011-09-14 – 2011-09-22 (×16): 8.6 mg via ORAL
  Filled 2011-09-14 (×20): qty 1

## 2011-09-14 MED ORDER — LACTATED RINGERS IV SOLN
INTRAVENOUS | Status: DC | PRN
  Administered 2011-09-14 (×3): via INTRAVENOUS

## 2011-09-14 MED ORDER — ONDANSETRON HCL 4 MG/2ML IJ SOLN
INTRAMUSCULAR | Status: DC | PRN
  Administered 2011-09-14 (×2): 4 mg via INTRAVENOUS

## 2011-09-14 MED ORDER — GABAPENTIN 300 MG PO CAPS
600.0000 mg | ORAL_CAPSULE | Freq: Every day | ORAL | Status: DC
  Administered 2011-09-14 – 2011-09-19 (×6): 600 mg via ORAL
  Filled 2011-09-14 (×8): qty 2

## 2011-09-14 MED ORDER — HYDROMORPHONE HCL 2 MG PO TABS
2.0000 mg | ORAL_TABLET | Freq: Three times a day (TID) | ORAL | Status: DC | PRN
  Administered 2011-09-17 – 2011-09-22 (×6): 2 mg via ORAL
  Filled 2011-09-14 (×7): qty 1

## 2011-09-14 MED ORDER — RISAQUAD PO CAPS
1.0000 | ORAL_CAPSULE | Freq: Every day | ORAL | Status: DC
  Administered 2011-09-15 – 2011-09-22 (×8): 1 via ORAL
  Filled 2011-09-14 (×9): qty 1

## 2011-09-14 MED ORDER — ARTIFICIAL TEARS OP OINT
TOPICAL_OINTMENT | OPHTHALMIC | Status: DC | PRN
  Administered 2011-09-14: 1 via OPHTHALMIC

## 2011-09-14 MED ORDER — PHENOL 1.4 % MT LIQD
1.0000 | OROMUCOSAL | Status: DC | PRN
  Filled 2011-09-14: qty 177

## 2011-09-14 MED ORDER — SODIUM CHLORIDE 0.9 % IJ SOLN
3.0000 mL | Freq: Two times a day (BID) | INTRAMUSCULAR | Status: DC
  Administered 2011-09-17 – 2011-09-19 (×4): 3 mL via INTRAVENOUS

## 2011-09-14 MED ORDER — SODIUM CHLORIDE 0.9 % IV SOLN: 250.0000 mL | INTRAVENOUS | Status: DC

## 2011-09-14 MED ORDER — ONDANSETRON HCL 4 MG/2ML IJ SOLN
4.0000 mg | INTRAMUSCULAR | Status: DC | PRN
  Administered 2011-09-17 – 2011-09-18 (×6): 4 mg via INTRAVENOUS
  Filled 2011-09-14 (×6): qty 2

## 2011-09-14 MED ORDER — KCL IN DEXTROSE-NACL 20-5-0.45 MEQ/L-%-% IV SOLN
INTRAVENOUS | Status: AC
  Administered 2011-09-14: 1000 mL
  Filled 2011-09-14: qty 1000

## 2011-09-14 MED ORDER — HYDROMORPHONE HCL PF 1 MG/ML IJ SOLN
INTRAMUSCULAR | Status: AC
  Filled 2011-09-14: qty 1

## 2011-09-14 MED ORDER — SODIUM CHLORIDE 0.9 % IV SOLN
INTRAVENOUS | Status: AC
  Filled 2011-09-14: qty 500

## 2011-09-14 MED ORDER — SIMVASTATIN 5 MG PO TABS
5.0000 mg | ORAL_TABLET | Freq: Every day | ORAL | Status: DC
  Administered 2011-09-14 – 2011-09-21 (×7): 5 mg via ORAL
  Filled 2011-09-14 (×9): qty 1

## 2011-09-14 MED ORDER — BACITRACIN 50000 UNITS IM SOLR
INTRAMUSCULAR | Status: AC
  Filled 2011-09-14: qty 1

## 2011-09-14 MED ORDER — FENTANYL CITRATE 0.05 MG/ML IJ SOLN
INTRAMUSCULAR | Status: DC | PRN
  Administered 2011-09-14 (×3): 50 ug via INTRAVENOUS
  Administered 2011-09-14: 150 ug via INTRAVENOUS

## 2011-09-14 MED ORDER — LIDOCAINE-EPINEPHRINE 1 %-1:100000 IJ SOLN
INTRAMUSCULAR | Status: DC | PRN
  Administered 2011-09-14: 6.5 mL

## 2011-09-14 MED ORDER — BISACODYL 10 MG RE SUPP
10.0000 mg | Freq: Every day | RECTAL | Status: DC | PRN
  Administered 2011-09-18: 10 mg via RECTAL
  Filled 2011-09-14: qty 1

## 2011-09-14 MED ORDER — SENNOSIDES-DOCUSATE SODIUM 8.6-50 MG PO TABS: 1.0000 | ORAL_TABLET | Freq: Every evening | ORAL | Status: DC | PRN

## 2011-09-14 MED ORDER — HYDROMORPHONE 0.3 MG/ML IV SOLN
INTRAVENOUS | Status: DC
  Administered 2011-09-14: 0.4 mg via INTRAVENOUS
  Administered 2011-09-15: 0.59 mg via INTRAVENOUS
  Administered 2011-09-15: 1.39 mg via INTRAVENOUS
  Administered 2011-09-15: 0.9 mg via INTRAVENOUS
  Administered 2011-09-16: 12:00:00 via INTRAVENOUS
  Administered 2011-09-16: 2.59 mg via INTRAVENOUS
  Administered 2011-09-16: 0.799 mg via INTRAVENOUS
  Filled 2011-09-14 (×2): qty 25

## 2011-09-14 MED ORDER — ESMOLOL HCL 10 MG/ML IV SOLN
INTRAVENOUS | Status: DC | PRN
  Administered 2011-09-14: 20 mg via INTRAVENOUS

## 2011-09-14 MED ORDER — ONDANSETRON HCL 4 MG/2ML IJ SOLN
4.0000 mg | Freq: Once | INTRAMUSCULAR | Status: AC | PRN
  Administered 2011-09-14: 4 mg via INTRAVENOUS

## 2011-09-14 MED ORDER — 0.9 % SODIUM CHLORIDE (POUR BTL) OPTIME
TOPICAL | Status: DC | PRN
  Administered 2011-09-14: 1000 mL

## 2011-09-14 MED ORDER — ROCURONIUM BROMIDE 100 MG/10ML IV SOLN
INTRAVENOUS | Status: DC | PRN
  Administered 2011-09-14: 50 mg via INTRAVENOUS

## 2011-09-14 MED ORDER — DIPHENHYDRAMINE HCL 12.5 MG/5ML PO ELIX: 12.5000 mg | ORAL_SOLUTION | Freq: Four times a day (QID) | ORAL | Status: DC | PRN

## 2011-09-14 MED ORDER — SODIUM CHLORIDE 0.9 % IR SOLN
Status: DC | PRN
  Administered 2011-09-14: 14:00:00

## 2011-09-14 MED ORDER — BACITRACIN 50000 UNITS IM SOLR
INTRAMUSCULAR | Status: DC | PRN
  Administered 2011-09-14: 13:00:00

## 2011-09-14 MED ORDER — NEOSTIGMINE METHYLSULFATE 1 MG/ML IJ SOLN
INTRAMUSCULAR | Status: DC | PRN
  Administered 2011-09-14: 4 mg via INTRAVENOUS

## 2011-09-14 MED ORDER — DIPHENHYDRAMINE HCL 50 MG/ML IJ SOLN: 12.5000 mg | Freq: Four times a day (QID) | INTRAMUSCULAR | Status: DC | PRN

## 2011-09-14 MED ORDER — SCOPOLAMINE 1 MG/3DAYS TD PT72
1.0000 | MEDICATED_PATCH | TRANSDERMAL | Status: DC
  Filled 2011-09-14: qty 1

## 2011-09-14 MED ORDER — ZOLPIDEM TARTRATE 5 MG PO TABS: 5.0000 mg | ORAL_TABLET | Freq: Every evening | ORAL | Status: DC | PRN

## 2011-09-14 MED ORDER — DEXAMETHASONE SODIUM PHOSPHATE 4 MG/ML IJ SOLN
INTRAMUSCULAR | Status: DC | PRN
  Administered 2011-09-14: 4 mg via INTRAVENOUS

## 2011-09-14 MED ORDER — METOCLOPRAMIDE HCL 5 MG/ML IJ SOLN
INTRAMUSCULAR | Status: DC | PRN
  Administered 2011-09-14: 10 mg via INTRAVENOUS

## 2011-09-14 MED ORDER — HYDROMORPHONE 0.3 MG/ML IV SOLN
INTRAVENOUS | Status: AC
Start: 2011-09-14 — End: 2011-09-14
  Administered 2011-09-14: 16:00:00
  Filled 2011-09-14: qty 25

## 2011-09-14 MED ORDER — THROMBIN 20000 UNITS EX SOLR
CUTANEOUS | Status: DC | PRN
  Administered 2011-09-14: 13:00:00 via TOPICAL

## 2011-09-14 MED ORDER — GLYCOPYRROLATE 0.2 MG/ML IJ SOLN
INTRAMUSCULAR | Status: DC | PRN
  Administered 2011-09-14: 0.6 mg via INTRAVENOUS

## 2011-09-14 MED ORDER — HEPARIN SODIUM (PORCINE) 1000 UNIT/ML IJ SOLN
INTRAMUSCULAR | Status: AC
  Filled 2011-09-14: qty 1

## 2011-09-14 MED ORDER — DOCUSATE SODIUM 100 MG PO CAPS
100.0000 mg | ORAL_CAPSULE | Freq: Two times a day (BID) | ORAL | Status: DC
  Administered 2011-09-14 – 2011-09-22 (×16): 100 mg via ORAL
  Filled 2011-09-14 (×12): qty 1

## 2011-09-14 MED ORDER — ONDANSETRON HCL 4 MG/2ML IJ SOLN
INTRAMUSCULAR | Status: AC
  Filled 2011-09-14: qty 2

## 2011-09-14 MED ORDER — LIDOCAINE HCL (CARDIAC) 20 MG/ML IV SOLN
INTRAVENOUS | Status: DC | PRN
  Administered 2011-09-14: 100 mg via INTRAVENOUS

## 2011-09-14 MED ORDER — ONDANSETRON HCL 4 MG/2ML IJ SOLN: 4.0000 mg | Freq: Four times a day (QID) | INTRAMUSCULAR | Status: DC | PRN

## 2011-09-14 MED ORDER — EPHEDRINE SULFATE 50 MG/ML IJ SOLN
INTRAMUSCULAR | Status: DC | PRN
  Administered 2011-09-14 (×2): 5 mg via INTRAVENOUS

## 2011-09-14 MED ORDER — PROPOFOL 10 MG/ML IV EMUL
INTRAVENOUS | Status: DC | PRN
  Administered 2011-09-14: 110 mg via INTRAVENOUS

## 2011-09-14 MED ORDER — CEFAZOLIN SODIUM 1-5 GM-% IV SOLN
1.0000 g | Freq: Three times a day (TID) | INTRAVENOUS | Status: AC
  Administered 2011-09-14 – 2011-09-15 (×2): 1 g via INTRAVENOUS
  Filled 2011-09-14 (×2): qty 50

## 2011-09-14 MED ORDER — SODIUM CHLORIDE 0.9 % IJ SOLN
3.0000 mL | INTRAMUSCULAR | Status: DC | PRN
  Administered 2011-09-16: 3 mL via INTRAVENOUS

## 2011-09-14 MED ORDER — FLEET ENEMA 7-19 GM/118ML RE ENEM: 1.0000 | ENEMA | Freq: Once | RECTAL | Status: AC | PRN

## 2011-09-14 MED ORDER — VERAPAMIL HCL ER 240 MG PO TBCR
240.0000 mg | EXTENDED_RELEASE_TABLET | Freq: Every day | ORAL | Status: DC
  Administered 2011-09-15 – 2011-09-22 (×8): 240 mg via ORAL
  Filled 2011-09-14 (×10): qty 1

## 2011-09-14 MED ORDER — VECURONIUM BROMIDE 10 MG IV SOLR
INTRAVENOUS | Status: DC | PRN
  Administered 2011-09-14: 2 mg via INTRAVENOUS

## 2011-09-14 MED ORDER — MENTHOL 3 MG MT LOZG
1.0000 | LOZENGE | OROMUCOSAL | Status: DC | PRN
  Filled 2011-09-14: qty 9

## 2011-09-14 MED ORDER — CEFAZOLIN SODIUM-DEXTROSE 2-3 GM-% IV SOLR
INTRAVENOUS | Status: DC | PRN
  Administered 2011-09-14: 2 g via INTRAVENOUS

## 2011-09-14 MED ORDER — SODIUM CHLORIDE 0.9 % IJ SOLN: 9.0000 mL | INTRAMUSCULAR | Status: DC | PRN

## 2011-09-14 MED ORDER — PANTOPRAZOLE SODIUM 40 MG IV SOLR
40.0000 mg | Freq: Every day | INTRAVENOUS | Status: DC
  Administered 2011-09-14: 40 mg via INTRAVENOUS
  Filled 2011-09-14 (×2): qty 40

## 2011-09-14 MED ORDER — SCOPOLAMINE 1 MG/3DAYS TD PT72
MEDICATED_PATCH | TRANSDERMAL | Status: AC
  Administered 2011-09-14: 1.5 mg
  Filled 2011-09-14: qty 1

## 2011-09-14 MED ORDER — KCL IN DEXTROSE-NACL 20-5-0.45 MEQ/L-%-% IV SOLN
INTRAVENOUS | Status: DC
  Administered 2011-09-14 – 2011-09-18 (×2): via INTRAVENOUS
  Filled 2011-09-14 (×10): qty 1000

## 2011-09-14 MED ORDER — NALOXONE HCL 0.4 MG/ML IJ SOLN: 0.4000 mg | INTRAMUSCULAR | Status: DC | PRN

## 2011-09-14 MED ORDER — HYDROMORPHONE HCL PF 1 MG/ML IJ SOLN
0.2500 mg | INTRAMUSCULAR | Status: DC | PRN
  Administered 2011-09-14 (×4): 0.5 mg via INTRAVENOUS

## 2011-09-14 MED ORDER — ACETAMINOPHEN 650 MG RE SUPP: 650.0000 mg | RECTAL | Status: DC | PRN

## 2011-09-14 MED ORDER — DIAZEPAM 5 MG PO TABS
5.0000 mg | ORAL_TABLET | Freq: Four times a day (QID) | ORAL | Status: DC | PRN
  Administered 2011-09-17 – 2011-09-22 (×7): 5 mg via ORAL
  Filled 2011-09-14 (×7): qty 1

## 2011-09-14 SURGICAL SUPPLY — 81 items
ADH SKN CLS APL DERMABOND .7 (GAUZE/BANDAGES/DRESSINGS) ×1
APL SKNCLS STERI-STRIP NONHPOA (GAUZE/BANDAGES/DRESSINGS) ×1
BAG DECANTER FOR FLEXI CONT (MISCELLANEOUS) ×2 IMPLANT
BENZOIN TINCTURE PRP APPL 2/3 (GAUZE/BANDAGES/DRESSINGS) ×2 IMPLANT
BLADE SURG ROTATE 9660 (MISCELLANEOUS) IMPLANT
BONE VOID FILLER STRIP 10CC (Bone Implant) ×1 IMPLANT
BUR MATCHSTICK NEURO 3.0 LAGG (BURR) ×2 IMPLANT
BUR PRECISION FLUTE 5.0 (BURR) ×2 IMPLANT
CANISTER SUCTION 2500CC (MISCELLANEOUS) ×2 IMPLANT
CLOTH BEACON ORANGE TIMEOUT ST (SAFETY) ×2 IMPLANT
CONT SPEC 4OZ CLIKSEAL STRL BL (MISCELLANEOUS) ×4 IMPLANT
COVER BACK TABLE 24X17X13 BIG (DRAPES) IMPLANT
COVER TABLE BACK 60X90 (DRAPES) ×2 IMPLANT
DERMABOND ADVANCED (GAUZE/BANDAGES/DRESSINGS) ×1
DERMABOND ADVANCED .7 DNX12 (GAUZE/BANDAGES/DRESSINGS) ×1 IMPLANT
DRAPE C-ARM 42X72 X-RAY (DRAPES) ×4 IMPLANT
DRAPE LAPAROTOMY 100X72X124 (DRAPES) ×2 IMPLANT
DRAPE POUCH INSTRU U-SHP 10X18 (DRAPES) ×2 IMPLANT
DRAPE SURG 17X23 STRL (DRAPES) ×2 IMPLANT
DRESSING TELFA 8X3 (GAUZE/BANDAGES/DRESSINGS) ×2 IMPLANT
DURAPREP 26ML APPLICATOR (WOUND CARE) ×2 IMPLANT
ELECT REM PT RETURN 9FT ADLT (ELECTROSURGICAL) ×2
ELECTRODE REM PT RTRN 9FT ADLT (ELECTROSURGICAL) ×1 IMPLANT
EVACUATOR 1/8 PVC DRAIN (DRAIN) ×1 IMPLANT
GAUZE SPONGE 4X4 16PLY XRAY LF (GAUZE/BANDAGES/DRESSINGS) IMPLANT
GLOVE BIO SURGEON STRL SZ8 (GLOVE) ×4 IMPLANT
GLOVE BIOGEL PI IND STRL 7.0 (GLOVE) IMPLANT
GLOVE BIOGEL PI IND STRL 8 (GLOVE) ×2 IMPLANT
GLOVE BIOGEL PI IND STRL 8.5 (GLOVE) ×2 IMPLANT
GLOVE BIOGEL PI INDICATOR 7.0 (GLOVE) ×2
GLOVE BIOGEL PI INDICATOR 8 (GLOVE) ×4
GLOVE BIOGEL PI INDICATOR 8.5 (GLOVE) ×2
GLOVE ECLIPSE 8.0 STRL XLNG CF (GLOVE) ×6 IMPLANT
GLOVE ECLIPSE 8.5 STRL (GLOVE) ×1 IMPLANT
GLOVE EXAM NITRILE LRG STRL (GLOVE) ×1 IMPLANT
GLOVE EXAM NITRILE MD LF STRL (GLOVE) IMPLANT
GLOVE EXAM NITRILE XL STR (GLOVE) IMPLANT
GLOVE EXAM NITRILE XS STR PU (GLOVE) IMPLANT
GLOVE SURG SS PI 7.0 STRL IVOR (GLOVE) ×3 IMPLANT
GOWN BRE IMP SLV AUR LG STRL (GOWN DISPOSABLE) IMPLANT
GOWN BRE IMP SLV AUR XL STRL (GOWN DISPOSABLE) ×7 IMPLANT
GOWN STRL REIN 2XL LVL4 (GOWN DISPOSABLE) ×5 IMPLANT
KIT BASIN OR (CUSTOM PROCEDURE TRAY) ×2 IMPLANT
KIT INFUSE SMALL (Orthopedic Implant) ×1 IMPLANT
KIT POSITION SURG JACKSON T1 (MISCELLANEOUS) ×2 IMPLANT
KIT ROOM TURNOVER OR (KITS) ×2 IMPLANT
MILL MEDIUM DISP (BLADE) ×2 IMPLANT
NDL HYPO 25X1 1.5 SAFETY (NEEDLE) ×1 IMPLANT
NDL SPNL 18GX3.5 QUINCKE PK (NEEDLE) IMPLANT
NEEDLE HYPO 25X1 1.5 SAFETY (NEEDLE) ×2 IMPLANT
NEEDLE SPNL 18GX3.5 QUINCKE PK (NEEDLE) IMPLANT
NS IRRIG 1000ML POUR BTL (IV SOLUTION) ×2 IMPLANT
PACK LAMINECTOMY NEURO (CUSTOM PROCEDURE TRAY) ×2 IMPLANT
PAD ARMBOARD 7.5X6 YLW CONV (MISCELLANEOUS) ×6 IMPLANT
PATTIES SURGICAL .5 X.5 (GAUZE/BANDAGES/DRESSINGS) IMPLANT
PATTIES SURGICAL .5 X1 (DISPOSABLE) IMPLANT
PATTIES SURGICAL 1X1 (DISPOSABLE) IMPLANT
ROD 60MM (Rod) ×1 IMPLANT
ROD STRAIGHT 5.5X4 (Rod) ×1 IMPLANT
SCREW 45MM (Screw) ×2 IMPLANT
SCREW 50MM (Screw) ×1 IMPLANT
SCREW POLYAX 6.5X45MM (Screw) ×2 IMPLANT
SCREW SET (Screw) ×5 IMPLANT
SPONGE GAUZE 4X4 12PLY (GAUZE/BANDAGES/DRESSINGS) ×2 IMPLANT
SPONGE LAP 4X18 X RAY DECT (DISPOSABLE) IMPLANT
SPONGE SURGIFOAM ABS GEL 100 (HEMOSTASIS) ×2 IMPLANT
STAPLER SKIN PROX WIDE 3.9 (STAPLE) IMPLANT
STRIP CLOSURE SKIN 1/2X4 (GAUZE/BANDAGES/DRESSINGS) ×2 IMPLANT
SUT VIC AB 1 CT1 18XBRD ANBCTR (SUTURE) ×2 IMPLANT
SUT VIC AB 1 CT1 8-18 (SUTURE) ×4
SUT VIC AB 2-0 CT1 18 (SUTURE) ×4 IMPLANT
SUT VIC AB 3-0 SH 8-18 (SUTURE) ×4 IMPLANT
SYR 20ML ECCENTRIC (SYRINGE) ×2 IMPLANT
SYR 3ML LL SCALE MARK (SYRINGE) ×4 IMPLANT
SYR 5ML LL (SYRINGE) IMPLANT
TAPE CLOTH 2X10 TAN LF (GAUZE/BANDAGES/DRESSINGS) ×1 IMPLANT
TOWEL OR 17X24 6PK STRL BLUE (TOWEL DISPOSABLE) ×2 IMPLANT
TOWEL OR 17X26 10 PK STRL BLUE (TOWEL DISPOSABLE) ×2 IMPLANT
TRAP SPECIMEN MUCOUS 40CC (MISCELLANEOUS) ×2 IMPLANT
TRAY FOLEY CATH 14FRSI W/METER (CATHETERS) ×2 IMPLANT
WATER STERILE IRR 1000ML POUR (IV SOLUTION) ×2 IMPLANT

## 2011-09-14 NOTE — Anesthesia Preprocedure Evaluation (Signed)
Anesthesia Evaluation  Patient identified by MRN, date of birth, ID band Patient awake    Reviewed: Allergy & Precautions, H&P , NPO status , Patient's Chart, lab work & pertinent test results  History of Anesthesia Complications (+) PONV  Airway       Dental   Pulmonary          Cardiovascular hypertension, Pt. on medications     Neuro/Psych    GI/Hepatic hiatal hernia, GERD-  ,  Endo/Other  Oral Hypoglycemic AgentsMorbid obesity  Renal/GU      Musculoskeletal   Abdominal   Peds  Hematology   Anesthesia Other Findings   Reproductive/Obstetrics                           Anesthesia Physical Anesthesia Plan  ASA: III  Anesthesia Plan: General   Post-op Pain Management:    Induction: Intravenous  Airway Management Planned: Oral ETT  Additional Equipment:   Intra-op Plan:   Post-operative Plan: Extubation in OR  Informed Consent: I have reviewed the patients History and Physical, chart, labs and discussed the procedure including the risks, benefits and alternatives for the proposed anesthesia with the patient or authorized representative who has indicated his/her understanding and acceptance.     Plan Discussed with: Anesthesiologist and Surgeon  Anesthesia Plan Comments:         Anesthesia Quick Evaluation

## 2011-09-14 NOTE — Op Note (Signed)
09/14/2011  3:22 PM  PATIENT:  Lori Mahoney  66 y.o. female  PRE-OPERATIVE DIAGNOSIS:  Scoliosis, Lumbar hernaited nucleus pulposus without myelopathy, Lumbar spondylosis, Lumbar stenosis L 2/3  POST-OPERATIVE DIAGNOSIS:  Scoliosis, Lumbar hernaited nucleus pulposus without myelopathy, Lumbar spondylosis, Lumbar stenosis L 2/3  PROCEDURE:  Procedure(s) (LRB): POSTERIOR LUMBAR Decompression and FUSION L2-L4 with posterolateral arthrodesis and pedicle screw fixation (N/A)  SURGEON:  Surgeon(s) and Role:    * Maeola Harman, MD - Primary    * Temple Pacini, MD - Assisting  PHYSICIAN ASSISTANT:   ASSISTANTS: Poteat, RN   ANESTHESIA:   general  EBL:  Total I/O In: 2000 [I.V.:2000] Out: 350 [Urine:150; Blood:200]  BLOOD ADMINISTERED:none  DRAINS: (Medium) Hemovact drain(s) in the epidural space with  Suction Open   LOCAL MEDICATIONS USED:  LIDOCAINE   SPECIMEN:  No Specimen  DISPOSITION OF SPECIMEN:  N/A  COUNTS:  YES  TOURNIQUET:  * No tourniquets in log *  DICTATION: DICTATION: Patient is 66 year old woman with retrolisthesis of L2 on L3 with lumbar stenosis and scoliosis. She has previously undergone L3-S1 posterior fusion and has now developed adjacent segment degeneration with disc herniation, scoliosis and stenosis.  It was elected to taker patient to surgery for decompression and fusion at this affected level.  She has a severe left L2 and L3 radiculopathy.  Procedure: Patient was placed in a prone position on the Sumter table after smooth and uncomplicated induction of general endotracheal anesthesia. Her low back was prepped and draped in usual sterile fashion with DuraPrep. Area of incision was infiltrated with local lidocaine. Incision was made to the lumbodorsal fascia was incised and exposure was performed of the L2 through L4 spinous processes laminae facet joint and transverse processes.Prior hardware was exposed to L4 on the right and L3 on the left.  A total  laminectomy of L2 and the remaining cephalad portion of L3  was performed with disarticulation of the facet joints at this level and thorough decompression was performed of both L2 and L3 nerve roots along with the common dural tube. This decompression was more involved than would be typical of that performed for PLIF alone and included painstaking dissection of adherent ligament compressing the thecal sac and wide decompression of all neural elements. It was not possible to safely enter the disc space and the disc herniation appeared to be calcified.  The neural elements were felt to be well decompressed at this point and it was elected to not place interbody spacers. The rod was cut below the L4 screw on the right and the L3 screw on the left. These rods and screws were removed and exchanged for similarly sized 6.5 mm diameter screws (prior screws were 5.5 mm).The posterolateral region was extensively decorticated and pedicle probes were placed at L2  bilaterally. Intraoperative fluoroscopy confirmed correct orientationin the AP and lateral plane. 45 x 5.5 mm pedicle screws were placed at L2 bilaterally and 45 x 6.5 mm screws placed at L3 and 4 bilaterally final x-rays demonstrated well-positioned interbody grafts and pedicle screw fixation. A 40 mm straight rod was placed on the left and a 60 mm rod was placed on the right locked down in situ and the posterolateral region was packed with the small BMP and bone autograft on the right and BMP with NexOss graft extender on the left. The wound was irrigated and a medium Hemovac drain was placed in the epidural space. Fascia was closed with 1 Vicryl sutures skin edges were reapproximated  2 and 3-0 Vicryl sutures. The wound is dressed with benzoin Steri-Strips Telfa gauze and tape the patient was extubated in the operating room and taken to recovery in stable satisfactory condition. She tolerated the operation well counts were correct at the end of the case.   PLAN  OF CARE: Admit to inpatient   PATIENT DISPOSITION:  PACU - hemodynamically stable.   Delay start of Pharmacological VTE agent (>24hrs) due to surgical blood loss or risk of bleeding: yes

## 2011-09-14 NOTE — Anesthesia Procedure Notes (Signed)
Procedure Name: Intubation Date/Time: 09/14/2011 12:30 PM Performed by: Elon Alas Pre-anesthesia Checklist: Patient identified, Timeout performed, Emergency Drugs available, Suction available and Patient being monitored Patient Re-evaluated:Patient Re-evaluated prior to inductionOxygen Delivery Method: Circle system utilized Preoxygenation: Pre-oxygenation with 100% oxygen Intubation Type: IV induction Ventilation: Mask ventilation without difficulty Laryngoscope Size: Mac and 3 Grade View: Grade I Tube type: Oral Tube size: 7.5 mm Number of attempts: 1 Airway Equipment and Method: Stylet Placement Confirmation: positive ETCO2,  ETT inserted through vocal cords under direct vision and breath sounds checked- equal and bilateral Secured at: 22 cm Tube secured with: Tape Dental Injury: Teeth and Oropharynx as per pre-operative assessment

## 2011-09-14 NOTE — Transfer of Care (Signed)
Immediate Anesthesia Transfer of Care Note  Patient: Lori Mahoney  Procedure(s) Performed: Procedure(s) (LRB): POSTERIOR LUMBAR FUSION 1 LEVEL (N/A)  Patient Location: PACU  Anesthesia Type: General  Level of Consciousness: awake and alert   Airway & Oxygen Therapy: Patient Spontanous Breathing and Patient connected to nasal cannula oxygen  Post-op Assessment: Report given to PACU RN, Post -op Vital signs reviewed and stable and Patient moving all extremities X 4  Post vital signs: Reviewed and stable  Complications: No apparent anesthesia complications

## 2011-09-14 NOTE — Anesthesia Postprocedure Evaluation (Signed)
Anesthesia Post Note  Patient: Lori Mahoney  Procedure(s) Performed: Procedure(s) (LRB): POSTERIOR LUMBAR FUSION 1 LEVEL (N/A)  Anesthesia type: general  Patient location: PACU  Post pain: Pain level controlled  Post assessment: Patient's Cardiovascular Status Stable  Last Vitals:  Filed Vitals:   09/14/11 1610  BP:   Pulse: 68  Temp:   Resp: 15    Post vital signs: Reviewed and stable  Level of consciousness: sedated  Complications: No apparent anesthesia complications

## 2011-09-14 NOTE — Preoperative (Signed)
Beta Blockers   Reason not to administer Beta Blockers:Not Applicable 

## 2011-09-14 NOTE — Interval H&P Note (Signed)
History and Physical Interval Note:  09/14/2011 6:26 AM  Lori Mahoney  has presented today for surgery, with the diagnosis of Scoliosis, Lumbar hnp without myelopathy, Lumbar spondylosis, Lumbar stenosis  The various methods of treatment have been discussed with the patient and family. After consideration of risks, benefits and other options for treatment, the patient has consented to  Procedure(s) (LRB): POSTERIOR LUMBAR FUSION 1 LEVEL (N/A) as a surgical intervention .  The patient's history has been reviewed, patient examined, no change in status, stable for surgery.  I have reviewed the patient's chart and labs.  Questions were answered to the patient's satisfaction.     [name] D  Date of Initial H&P: 09/13/2011  History reviewed, patient examined, no change in status, stable for surgery.

## 2011-09-15 ENCOUNTER — Encounter (HOSPITAL_COMMUNITY): Payer: Self-pay | Admitting: *Deleted

## 2011-09-15 MED ORDER — PANTOPRAZOLE SODIUM 40 MG PO TBEC
40.0000 mg | DELAYED_RELEASE_TABLET | Freq: Every day | ORAL | Status: DC
  Administered 2011-09-15 – 2011-09-21 (×7): 40 mg via ORAL
  Filled 2011-09-15 (×7): qty 1

## 2011-09-15 MED FILL — Sodium Chloride IV Soln 0.9%: INTRAVENOUS | Qty: 1000 | Status: AC

## 2011-09-15 MED FILL — Heparin Sodium (Porcine) Inj 1000 Unit/ML: INTRAMUSCULAR | Qty: 30 | Status: AC

## 2011-09-15 NOTE — Progress Notes (Signed)
Pt able to sit on side of the bed and take a few steps away from the bed this morning with moderate pain. Foley catheter was removed at 0640.

## 2011-09-15 NOTE — Clinical Social Work Note (Signed)
CSW received a consult for SNF. CSW reviewed chart and discussed pt with RN during progression. PT is recommending home health PT. RNCM is aware and following. CSW is signing of as no further needs identified. Please reconsult if a need arises prior to discharge.   Dede Query, MSW, Theresia Majors [phone]

## 2011-09-15 NOTE — Progress Notes (Signed)
Up to [facility], voided 150 amber urine,  Assisted back in bed.  Resting quietly

## 2011-09-15 NOTE — Progress Notes (Signed)
Occupational Therapy Evaluation Patient Details Name: Lori Mahoney MRN: [id number] DOB: July 13, 1945 Today's Date: 09/15/2011 Time: 4098-1191 OT Time Calculation (min): 23 min  OT Assessment / Plan / Recommendation Clinical Impression  Pt s/p PLIF 1 level thus affecting PLOF. Willl benefit from acute OT services to address below problem list in prep for d/c home with spouse.    OT Assessment  Patient needs continued OT Services    Follow Up Recommendations  Home health OT;Supervision/Assistance - 24 hour    Barriers to Discharge      Equipment Recommendations   (TBD by OT)    Recommendations for Other Services    Frequency  Min 2X/week    Precautions / Restrictions Precautions Precautions: Back Precaution Comments: Educated pt on 3/3 back precautions Required Braces or Orthoses: Spinal Brace Spinal Brace: Lumbar corset;Applied in sitting position   Pertinent Vitals/Pain See vitals    ADL  Upper Body Dressing: Performed;Minimal assistance Where Assessed - Upper Body Dressing: Unsupported sitting Lower Body Dressing: Performed;+1 Total assistance Where Assessed - Lower Body Dressing: Unsupported sitting Toilet Transfer: Performed;Moderate assistance Toilet Transfer Method: Sit to stand Toilet Transfer Equipment: Other (comment) (chair) Equipment Used: Back brace;Gait belt;Rolling walker Transfers/Ambulation Related to ADLs: Min-mod assist with RW for ambulation throughout room. ADL Comments: Pt limited by pain.  Donned back brace with min assist  while sitting EOB.    OT Diagnosis: Generalized weakness;Acute pain  OT Problem List: Decreased strength;Decreased activity tolerance;Decreased knowledge of precautions;Decreased knowledge of use of DME or AE;Pain OT Treatment Interventions: Self-care/ADL training;DME and/or AE instruction;Therapeutic activities;Patient/family education   OT Goals Acute Rehab OT Goals OT Goal Formulation: With patient Time For Goal  Achievement: 09/22/11 Potential to Achieve Goals: Good ADL Goals Pt Will Perform Grooming: with supervision;Standing at sink ADL Goal: Grooming - Progress: Goal set today Pt Will Perform Lower Body Bathing: with supervision;Sit to stand from chair;Sit to stand from bed;with adaptive equipment ADL Goal: Lower Body Bathing - Progress: Goal set today Pt Will Perform Lower Body Dressing: with supervision;Sit to stand from chair;Sit to stand from bed;with adaptive equipment ADL Goal: Lower Body Dressing - Progress: Goal set today Pt Will Transfer to Toilet: with supervision;Ambulation;with DME;Comfort height toilet;Maintaining back safety precautions ADL Goal: Toilet Transfer - Progress: Goal set today Pt Will Perform Toileting - Clothing Manipulation: with supervision;Standing ADL Goal: Toileting - Clothing Manipulation - Progress: Goal set today Pt Will Perform Toileting - Hygiene: with supervision;Standing at 3-in-1/toilet ADL Goal: Toileting - Hygiene - Progress: Goal set today Miscellaneous OT Goals Miscellaneous OT Goal #1: Pt will perform bed mobility with supervision in prep for EOB ADLs. OT Goal: Miscellaneous Goal #1 - Progress: Goal set today Miscellaneous OT Goal #2: Pt will independently verbalize and generalize 3/3 back precautions during all ADL activity. OT Goal: Miscellaneous Goal #2 - Progress: Goal set today  Visit Information  Last OT Received On: 09/15/11 Assistance Needed: +2 (for safety) PT/OT Co-Evaluation/Treatment: Yes    Subjective Data      Prior Functioning  Vision/Perception  Home Living Lives With: Spouse Available Help at Discharge: Family;Available 24 hours/day Type of Home: House Home Access: Stairs to enter [employer] of Steps: 4 Entrance Stairs-Rails: Right;Left;Can reach both Home Layout: One level Bathroom Shower/Tub: Walk-in shower;Tub only Bathroom Toilet: Standard Bathroom Accessibility: Yes How Accessible: Accessible via  walker Home Adaptive Equipment: Quad cane;Walker - four wheeled;Bedside commode/3-in-1;Wheelchair - manual;Walker - standard Prior Function Level of Independence: Independent with assistive device(s) Able to Take Stairs?: Yes Driving: No  Vision - Assessment Additional Comments: Pt wears contact in left eye and plans to have eye surgery soon.  Cognition  Overall Cognitive Status: Appears within functional limits for tasks assessed/performed Arousal/Alertness: Lethargic Orientation Level: Appears intact for tasks assessed Behavior During Session: Lethargic    Extremity/Trunk Assessment Right Upper Extremity Assessment RUE ROM/Strength/Tone: [facility] for tasks assessed Left Upper Extremity Assessment LUE ROM/Strength/Tone: WFL for tasks assessed   Mobility Bed Mobility Bed Mobility: Rolling Right;Right Sidelying to Sit;Sitting - Scoot to Edge of Bed Rolling Right: 3: Mod assist;With rail Right Sidelying to Sit: 3: Mod assist;With rails Sitting - Scoot to Edge of Bed: 3: Mod assist Details for Bed Mobility Assistance: Assist to support trunk and LEs OOB.  Manual and verbal cues for sequencing. Transfers Transfers: Sit to Stand;Stand to Sit Sit to Stand: 3: Mod assist;From bed;With upper extremity assist Stand to Sit: 4: Min assist;To chair/3-in-1;With armrests;With upper extremity assist Details for Transfer Assistance: Assist to obtain full upright posture.  Verbal cueing for safe hand placement.   Exercise    Balance    End of Session OT - End of Session Equipment Utilized During Treatment: Gait belt;Back brace Activity Tolerance: Patient limited by fatigue;Patient limited by pain Patient left: in chair;with call bell/phone within reach;with family/visitor present Nurse Communication: Mobility status  GO    09/15/2011 Cipriano Mile OTR/L Pager [phone] Office [phone]  Cipriano Mile 09/15/2011, 11:12 AM

## 2011-09-15 NOTE — Progress Notes (Signed)
Improving well following posterior decompression and fusion.  Mobilize starting today.

## 2011-09-15 NOTE — Plan of Care (Signed)
Problem: Consults Goal: Diagnosis - Spinal Surgery Thoraco/Lumbar Spine Fusion     

## 2011-09-15 NOTE — Progress Notes (Signed)
UR COMPLETED  

## 2011-09-15 NOTE — Progress Notes (Signed)
RN was notified of abnormal BP  

## 2011-09-15 NOTE — Evaluation (Signed)
Physical Therapy Evaluation Patient Details Name: Lori Mahoney MRN: [id number] DOB: [birth year] Today's Date: 09/15/2011 Time: 4098-1191 PT Time Calculation (min): 24 min  PT Assessment / Plan / Recommendation Clinical Impression  Lori Mahoney is 66 y/o s/p PLIF. Presents to PT today with pain and weakness following surgery affecting mobility and independence. Will benefit physical theray in the acute setting to address the below impairments so as to maximize functional indepdnence and safety with adherence to new precautions. Rec HHPT and RW for d/c.     PT Assessment  Patient needs continued PT services    Follow Up Recommendations  Home health PT;Supervision for mobility/OOB    Barriers to Discharge        Equipment Recommendations  Rolling walker with 5" wheels    Recommendations for Other Services     Frequency Min 5X/week    Precautions / Restrictions Precautions Precautions: Back Spinal Brace: Lumbar corset;Applied in sitting position         Mobility  Bed Mobility Bed Mobility: Rolling Right;Right Sidelying to Sit;Sitting - Scoot to Edge of Bed Rolling Right: 3: Mod assist;With rail Right Sidelying to Sit: 3: Mod assist;With rails Sitting - Scoot to Edge of Bed: 3: Mod assist Details for Bed Mobility Assistance: cues for sequencing and hand placement as well as back precaution adherence Transfers Transfers: Sit to Stand;Stand to Sit Sit to Stand: 3: Mod assist;From bed;With upper extremity assist Stand to Sit: 4: Min assist;With armrests;To chair/3-in-1;With upper extremity assist Details for Transfer Assistance: assist to obtain full upright posture and to prevent arching/twisting with ascent, verbal and hand over hand cueing for safe hand placement with RW Ambulation/Gait Ambulation/Gait Assistance: 4: Min assist Ambulation Distance (Feet): 10 Feet Assistive device: Rolling walker Ambulation/Gait Assistance Details: cues for upright posture (pt tends to arch  with weight in her heels) and correct use of RW  Gait Pattern: Step-through pattern General Gait Details: slow lethargic, after 10 steps pt very affected by morphine with slower processing and falling to the right so chair brought up behind her for safety and pt sat to rest    Exercises     PT Diagnosis: Difficulty walking;Abnormality of gait;Generalized weakness;Acute pain  PT Problem List: Decreased strength;Decreased activity tolerance;Decreased balance;Pain PT Treatment Interventions: DME instruction;Gait training;Stair training;Functional mobility training;Therapeutic activities;Therapeutic exercise;Patient/family education;Neuromuscular re-education;Balance training   PT Goals Acute Rehab PT Goals PT Goal Formulation: With patient Time For Goal Achievement: 09/22/11 Potential to Achieve Goals: Good Pt will Roll Supine to Right Side: with modified independence PT Goal: Rolling Supine to Right Side - Progress: Goal set today Pt will Roll Supine to Left Side: with modified independence PT Goal: Rolling Supine to Left Side - Progress: Goal set today Pt will go Supine/Side to Sit: with modified independence PT Goal: Supine/Side to Sit - Progress: Goal set today Pt will go Sit to Supine/Side: with modified independence PT Goal: Sit to Supine/Side - Progress: Goal set today Pt will go Sit to Stand: with modified independence PT Goal: Sit to Stand - Progress: Goal set today Pt will go Stand to Sit: with modified independence PT Goal: Stand to Sit - Progress: Goal set today Pt will Transfer Bed to Chair/Chair to Bed: with modified independence PT Transfer Goal: Bed to Chair/Chair to Bed - Progress: Goal set today Pt will Ambulate: >150 feet;with modified independence;with least restrictive assistive device PT Goal: Ambulate - Progress: Goal set today Pt will Go Up / Down Stairs: 3-5 stairs;with min assist;with least restrictive assistive device  PT Goal: Up/Down Stairs - Progress: Goal  set today Additional Goals Additional Goal #1: Pt will verbalize and demonstrate understanding of 3/3 back precautions for safe d/c home.  PT Goal: Additional Goal #1 - Progress: Goal set today  Visit Information  Last PT Received On: 09/15/11    Subjective Data  Subjective: The last time I had surgery they dropped me the first time I got up.    Prior Functioning  Home Living Lives With: Spouse Available Help at Discharge: Family;Available 24 hours/day Type of Home: House Home Access: Stairs to enter [employer] of Steps: 4 Entrance Stairs-Rails: Right;Left;Can reach both Home Layout: One level Bathroom Shower/Tub: Walk-in shower;Tub only Bathroom Toilet: Standard Bathroom Accessibility: Yes How Accessible: Accessible via wheelchair Home Adaptive Equipment: Quad cane;Walker - four wheeled;Bedside commode/3-in-1;Walker - standard;Wheelchair - manual Prior Function Level of Independence: Independent with assistive device(s) Able to Take Stairs?: Yes Driving: No Communication Communication: No difficulties    Cognition  Overall Cognitive Status: Appears within functional limits for tasks assessed/performed Arousal/Alertness: Lethargic Orientation Level: Appears intact for tasks assessed Behavior During Session: Lethargic (due to pain meds) Cognition - Other Comments: a bit slower processing after hitting morphine PCA button    Extremity/Trunk Assessment Right Upper Extremity Assessment RUE ROM/Strength/Tone: [facility] for tasks assessed Left Upper Extremity Assessment LUE ROM/Strength/Tone: [facility] for tasks assessed Right Lower Extremity Assessment RLE ROM/Strength/Tone: [facility] for tasks assessed Left Lower Extremity Assessment LLE ROM/Strength/Tone: WFL for tasks assessed   Balance    End of Session PT - End of Session Equipment Utilized During Treatment: Gait belt;Back brace Activity Tolerance: Patient tolerated treatment well Patient left: in chair;with call  bell/phone within reach Nurse Communication: Mobility status  GP     [facility] HELEN 09/15/2011, 2:09 PM

## 2011-09-15 NOTE — Progress Notes (Signed)
Subjective: Patient reports "I feel pretty good; just a little nausea this morning."  Objective: Vital signs in last 24 hours: Temp:  [97 F (36.1 C)-98.2 F (36.8 C)] 98.2 F (36.8 C) (08/21 0533) Pulse Rate:  [58-104] 90  (08/21 0533) Resp:  [9-27] 9  (08/21 0832) BP: (122-187)/(62-85) 122/64 mmHg (08/21 0533) SpO2:  [94 %-100 %] 94 % (08/21 0832) Weight:  [91.3 kg (201 lb 4.5 oz)] 91.3 kg (201 lb 4.5 oz) (08/20 1700)  Intake/Output from previous day: 08/20 0701 - 08/21 0700 In: 2360 [I.V.:2000] Out: 1060 [Urine:650; Drains:210; Blood:200] Intake/Output this shift: Total I/O In: 480 [P.O.:480] Out: -   Alert, conversant. Husband at bedside. Good strength BLE. Hemovac patent ( ). Drsg intact (old blood - no new drainage). Incision without erythema or swelling.  Appetite fair-good this am. No leg pain at present, some lumbar discomfort.  Lab Results: No results found for this basename: WBC:2,HGB:2,HCT:2,PLT:2 in the last 72 hours BMET No results found for this basename: NA:2,K:2,CL:2,CO2:2,GLUCOSE:2,BUN:2,CREATININE:2,CALCIUM:2 in the last 72 hours  Studies/Results: Dg Lumbar Spine Complete  09/14/2011  *RADIOLOGY REPORT*  Clinical Data: Back pain  DG C-ARM 1-60 MIN,LUMBAR SPINE - COMPLETE 4+ VIEW  Comparison: None.  Findings: C-arm films document revision of the previous L3-S1 fusion upward to involve the L2-L3 level using pedicle screw fixation.  IMPRESSION: As above.   Original Report Authenticated By: Elsie Stain, M.D.    Dg C-arm 1-60 Min  09/14/2011  *RADIOLOGY REPORT*  Clinical Data: Back pain  DG C-ARM 1-60 MIN,LUMBAR SPINE - COMPLETE 4+ VIEW  Comparison: None.  Findings: C-arm films document revision of the previous L3-S1 fusion upward to involve the L2-L3 level using pedicle screw fixation.  IMPRESSION: As above.   Original Report Authenticated By: Elsie Stain, M.D.     Assessment/Plan: Improving  LOS: 1 day  Mobilize in LSO with PT.   Georgiann Cocker 09/15/2011, 9:11 AM

## 2011-09-15 NOTE — Progress Notes (Signed)
hemovac

## 2011-09-16 NOTE — Progress Notes (Signed)
Patient ID: Anjalina C [place], female   DOB: December 25, 1945, 66 y.o.   MRN: [id number] Subjective: Patient reports back sore, legs ok. Ambulating well.  Objective: Vital signs in last 24 hours: Temp:  [98 F (36.7 C)-99.6 F (37.6 C)] 98.6 F (37 C) (08/22 1837) Pulse Rate:  [84-98] 90  (08/22 1837) Resp:  [13-20] 18  (08/22 1837) BP: (120-154)/(53-72) 141/72 mmHg (08/22 1837) SpO2:  [91 %-99 %] 95 % (08/22 1837)  Intake/Output from previous day: 08/21 0701 - 08/22 0700 In: 1430 [P.O.:480; I.V.:950] Out: 520 [Urine:150; Drains:370] Intake/Output this shift:    Good strength  Lab Results: Lab Results  Component Value Date   WBC 10.9* 09/09/2011   HGB 12.9 09/09/2011   HCT 39.3 09/09/2011   MCV 85.6 09/09/2011   PLT 278 09/09/2011   No results found for this basename: INR, PROTIME   BMET Lab Results  Component Value Date   NA 138 09/09/2011   K 3.3* 09/09/2011   CL 99 09/09/2011   CO2 24 09/09/2011   GLUCOSE 198* 09/09/2011   BUN 12 09/09/2011   CREATININE 0.68 09/09/2011   CALCIUM 9.6 09/09/2011    Studies/Results: No results found.  Assessment/Plan: Doing well. Continue PT/OT and pain control   LOS: 2 days    Kinsley Nicklaus S 09/16/2011, 6:42 PM

## 2011-09-16 NOTE — Progress Notes (Signed)
Physical Therapy Treatment Patient Details Name: Lori Mahoney MRN: [id number] DOB: 08/24/1945 Today's Date: 09/16/2011 Time: 0920-0943 PT Time Calculation (min): 23 min  PT Assessment / Plan / Recommendation Comments on Treatment Session  Patient progressing well with ambulation today. Still some fatique issues and patient tends to sit without warning. Continue with progressive ambulation and POC    Follow Up Recommendations  Home health PT;Supervision for mobility/OOB    Barriers to Discharge        Equipment Recommendations  Rolling walker with 5" wheels    Recommendations for Other Services    Frequency Min 5X/week   Plan Discharge plan remains appropriate;Frequency remains appropriate    Precautions / Restrictions Precautions Precautions: Back Precaution Comments: Patient able to verbalize all precautions, requires cueing to follow with mobility Required Braces or Orthoses: Spinal Brace Spinal Brace: Lumbar corset;Applied in sitting position   Pertinent Vitals/Pain     Mobility  Bed Mobility Rolling Right: 4: Min guard;With rail Right Sidelying to Sit: 4: Min guard;With rails Sitting - Scoot to Edge of Bed: 4: Min guard Details for Bed Mobility Assistance: Cues for safe log roll technique Transfers Sit to Stand: 4: Min assist;With upper extremity assist;From bed Stand to Sit: 4: Min assist;With armrests;To chair/3-in-1 Details for Transfer Assistance: A to initiate stand and cues for safe hand placement and cues/A to sit slowly Ambulation/Gait Ambulation/Gait Assistance: 4: Min assist Ambulation Distance (Feet): 120 Feet Assistive device: Rolling walker Ambulation/Gait Assistance Details: Patient requiring cues for upright posture and safety with RW positioning. Patient with sudden urge to sit with ambulation Gait Pattern: Step-through pattern;Decreased stride length;Trunk flexed    Exercises     PT Diagnosis:    PT Problem List:   PT Treatment Interventions:      PT Goals Acute Rehab PT Goals PT Goal: Rolling Supine to Right Side - Progress: Progressing toward goal PT Goal: Supine/Side to Sit - Progress: Progressing toward goal PT Goal: Sit to Supine/Side - Progress: Progressing toward goal PT Goal: Sit to Stand - Progress: Progressing toward goal PT Goal: Stand to Sit - Progress: Progressing toward goal PT Transfer Goal: Bed to Chair/Chair to Bed - Progress: Progressing toward goal PT Goal: Ambulate - Progress: Progressing toward goal  Visit Information  Last PT Received On: 09/16/11 Assistance Needed: +2 (for safety)    Subjective Data      Cognition  Overall Cognitive Status: Appears within functional limits for tasks assessed/performed Arousal/Alertness: Awake/alert Orientation Level: Appears intact for tasks assessed Behavior During Session: [place] for tasks performed    Balance     End of Session PT - End of Session Equipment Utilized During Treatment: Gait belt;Back brace Activity Tolerance: Patient tolerated treatment well Patient left: in chair;with call bell/phone within reach Nurse Communication: Mobility status   GP     Fredrich Birks 09/16/2011, 2:31 PM 09/16/2011 Fredrich Birks PTA [phone] pager [phone] office

## 2011-09-17 MED ORDER — MORPHINE SULFATE 2 MG/ML IJ SOLN: 2.0000 mg | INTRAMUSCULAR | Status: DC | PRN

## 2011-09-17 MED ORDER — PROMETHAZINE HCL 25 MG PO TABS
25.0000 mg | ORAL_TABLET | Freq: Four times a day (QID) | ORAL | Status: DC | PRN
  Filled 2011-09-17: qty 1

## 2011-09-17 MED ORDER — PROMETHAZINE HCL 25 MG/ML IJ SOLN
25.0000 mg | Freq: Four times a day (QID) | INTRAMUSCULAR | Status: DC | PRN
  Administered 2011-09-17: 25 mg via INTRAVENOUS
  Filled 2011-09-17: qty 1

## 2011-09-17 MED ORDER — MORPHINE SULFATE 15 MG PO TABS
15.0000 mg | ORAL_TABLET | ORAL | Status: DC | PRN
  Administered 2011-09-17 – 2011-09-22 (×11): 15 mg via ORAL
  Filled 2011-09-17 (×12): qty 1

## 2011-09-17 NOTE — Progress Notes (Addendum)
Called by RN to evaluate patient who is lethargic and disoriented.  Upon arrival to patients room, RN at bedside.  Patient is in bed, is lethargic but arouseable, answered questions appropriately.  Assisted patient to bedside commode.  Seems to be related to the pain meds and n/v meds, VSS--no need for narcan at this time.  Instructed RN to monitor patient and to call if needing assistance.  Will follow

## 2011-09-17 NOTE — Progress Notes (Signed)
Patient ID: Felipe C [place], female   DOB: 13-Jun-1945, 66 y.o.   MRN: [id number] Subjective: Patient reports continued back pain without leg pain, but nausea with pain meds persists. She has flatus.  Objective: Vital signs in last 24 hours: Temp:  [98 F (36.7 C)-98.7 F (37.1 C)] 98.3 F (36.8 C) (08/23 1000) Pulse Rate:  [73-90] 79  (08/23 1000) Resp:  [13-18] 17  (08/23 1000) BP: (97-141)/(52-72) 137/57 mmHg (08/23 1000) SpO2:  [95 %-100 %] 96 % (08/23 1000) FiO2 (%):  [30 %-31 %] 31 % (08/23 0707)  Intake/Output from previous day: 08/22 0701 - 08/23 0700 In: 1000 [I.V.:1000] Out: 60 [Drains:60] Intake/Output this shift:    Neurologic: Grossly normal  Lab Results: Lab Results  Component Value Date   WBC 10.9* 09/09/2011   HGB 12.9 09/09/2011   HCT 39.3 09/09/2011   MCV 85.6 09/09/2011   PLT 278 09/09/2011   No results found for this basename: INR, PROTIME   BMET Lab Results  Component Value Date   NA 138 09/09/2011   K 3.3* 09/09/2011   CL 99 09/09/2011   CO2 24 09/09/2011   GLUCOSE 198* 09/09/2011   BUN 12 09/09/2011   CREATININE 0.68 09/09/2011   CALCIUM 9.6 09/09/2011    Studies/Results: No results found.  Assessment/Plan: Continue pain control as best we can.   LOS: 3 days    Enedelia Martorelli S 09/17/2011, 12:22 PM

## 2011-09-17 NOTE — Progress Notes (Signed)
Pt became disoriented and required frequent orientation, speech was always mumbled, MD called to asked if Narcan should be given, MD stated if appropriate, then yes.  Called [facility] to assess patient, RR RN came and said that patient was fine, just over-sedated from the effects of pain and nausea/vomiting meds.  VSS, will continue to monitor patient.

## 2011-09-17 NOTE — Progress Notes (Signed)
Patient vomited twice which contain undigested food and some mucus. Already received nausea medicine. Patient is lethargic, vitals signs stable. Will continue to monitor the patient.

## 2011-09-17 NOTE — Progress Notes (Signed)
Pt's states "pain is not being managed well",  Patient was explained that she has been given pain medications as needed and has several medications for pain to adequately control her pain.  Pt experienced disorientation before and was arousable from the effects of pain meds and n/v meds, therefore we are trying to keep her control while maintaining her hemodynamically and neurologically.  Baseline patient has DDD and chronic lumbar pain.  Currently VSS, will monitor tonight.

## 2011-09-17 NOTE — Progress Notes (Signed)
PT Cancellation Note  Treatment cancelled today due to medical issues with patient which prohibited therapy. Patient with increased nausea. Per RN will hold at this time and reattempt later in the afternoon as time allows.  09/17/2011 Fredrich Birks PTA [phone] pager [phone] office     Fredrich Birks 09/17/2011, 11:19 AM

## 2011-09-17 NOTE — Progress Notes (Signed)
Occupational Therapy Treatment Patient Details Name: Lori Mahoney MRN: [id number] DOB: [birth year] Today's Date: 09/17/2011 Time: 4098-1191 OT Time Calculation (min): 18 min  OT Assessment / Plan / Recommendation Comments on Treatment Session Session very limited due to pt pain and nausea.  Attempted to perform AE education sitting EOB but pt unable to tolerate EOB sitting.  Will re-attempt another session later today or tomorrow before pt discharges.    Follow Up Recommendations  Home health OT;Supervision/Assistance - 24 hour    Barriers to Discharge       Equipment Recommendations  Tub/shower seat    Recommendations for Other Services    Frequency Min 2X/week   Plan Discharge plan remains appropriate    Precautions / Restrictions Precautions Precautions: Back Precaution Comments: Pt able to recall 1/3 back precautions.  Educated pt on 3/3. Required Braces or Orthoses: Spinal Brace Spinal Brace: Lumbar corset;Applied in sitting position   Pertinent Vitals/Pain See vitals    ADL  Lower Body Dressing: Simulated;Moderate assistance Where Assessed - Lower Body Dressing: Unsupported sitting Equipment Used: Back brace Transfers/Ambulation Related to ADLs: Pt declined OOB transfer ADL Comments: Pt very limited during this session due to pain and nausea.  Provided max encouragement to pt to sit EOB for AE education.  While sitting EOB, pt's pain and nausea increased, and pt kept eyes closed.  Provided AE and back precautions education while sitting EOB. Pt with difficulty crossing ankles over legs during LB dressing simulation but is able to reach feet when supine in bed with knees flexed.  Pt would greatly benefit from further AE education to maximize independence with ADLs.     OT Diagnosis:    OT Problem List:   OT Treatment Interventions:     OT Goals ADL Goals Pt Will Perform Lower Body Dressing: with supervision;Sit to stand from chair;Sit to stand from bed;with adaptive  equipment ADL Goal: Lower Body Dressing - Progress: Progressing toward goals Miscellaneous OT Goals Miscellaneous OT Goal #1: Pt will perform bed mobility with supervision in prep for EOB ADLs. OT Goal: Miscellaneous Goal #1 - Progress: Progressing toward goals Miscellaneous OT Goal #2: Pt will independently verbalize and generalize 3/3 back precautions during all ADL activity. OT Goal: Miscellaneous Goal #2 - Progress: Progressing toward goals  Visit Information  Last OT Received On: 09/17/11    Subjective Data      Prior Functioning       Cognition  Overall Cognitive Status: Appears within functional limits for tasks assessed/performed Arousal/Alertness: Awake/alert Orientation Level: Appears intact for tasks assessed Behavior During Session: [facility] for tasks performed    Mobility Bed Mobility Bed Mobility: Rolling Left;Left Sidelying to Sit;Sitting - Scoot to Edge of Bed;Sit to Sidelying Left Rolling Left: 5: Supervision;With rail Left Sidelying to Sit: 5: Supervision;With rails Sitting - Scoot to Edge of Bed: 5: Supervision Sit to Sidelying Left: 4: Min assist Details for Bed Mobility Assistance: assist to support LEs into bed. Transfers Transfers: Not assessed   Exercises    Balance    End of Session OT - End of Session Equipment Utilized During Treatment: Back brace Activity Tolerance: Patient limited by pain (nausea) Patient left: in bed;with bed alarm set;with call bell/phone within reach  GO    09/17/2011 Cipriano Mile OTR/L Pager [phone] Office [phone]  Cipriano Mile 09/17/2011, 9:37 AM

## 2011-09-18 LAB — GLUCOSE, CAPILLARY
Glucose-Capillary: 169 mg/dL — ABNORMAL HIGH (ref 70–99)
Glucose-Capillary: 165 mg/dL — ABNORMAL HIGH (ref 70–99)

## 2011-09-18 MED ORDER — FLEET ENEMA 7-19 GM/118ML RE ENEM
1.0000 | ENEMA | Freq: Every day | RECTAL | Status: DC | PRN
  Administered 2011-09-18: 10:00:00 via RECTAL
  Filled 2011-09-18: qty 1

## 2011-09-18 NOTE — Progress Notes (Signed)
Physical Therapy Treatment Patient Details Name: Lori Mahoney MRN: [id number] DOB: 1945/08/09 Today's Date: 09/18/2011 Time: 4098-1191 PT Time Calculation (min): 19 min  PT Assessment / Plan / Recommendation Comments on Treatment Session  Pt limited this session secondary to 10/10 pain as well as N &V. Pt moves well, willl continue treatment pending medical stability.    Follow Up Recommendations  Home health PT;Supervision for mobility/OOB    Barriers to Discharge        Equipment Recommendations  Tub/shower seat    Recommendations for Other Services    Frequency Min 5X/week   Plan Discharge plan remains appropriate;Frequency remains appropriate    Precautions / Restrictions Precautions Precautions: Back Precaution Comments: Educated pt on 3/3 back precautions. Required Braces or Orthoses: Spinal Brace Spinal Brace: Lumbar corset;Applied in sitting position   Pertinent Vitals/Pain Pain 9-10/10. N& V at end of session. RN aware and in room.     Mobility  Bed Mobility Bed Mobility: Rolling Right;Right Sidelying to Sit;Sitting - Scoot to Edge of Bed;Sit to Sidelying Right Rolling Right: 5: Supervision Right Sidelying to Sit: 5: Supervision Sitting - Scoot to Edge of Bed: 5: Supervision Sit to Sidelying Right: 4: Min assist Details for Bed Mobility Assistance: Assist to support LEs into bed. Transfers Transfers: Sit to Stand;Stand to Sit Sit to Stand: 4: Min guard;From bed;With upper extremity assist;From toilet Stand to Sit: 4: Min guard;To bed;With upper extremity assist;To toilet Details for Transfer Assistance: Min guard for safety. Ambulation/Gait Ambulation/Gait Assistance: 4: Min guard Ambulation Distance (Feet): 25 Feet Assistive device: Rolling walker Ambulation/Gait Assistance Details: Minguard for safety throughout ambulation. Pt moves well with minimal cueing needed Gait Pattern: Step-through pattern;Decreased stride length;Trunk flexed General Gait Details:  Distance limited secondary to nausea and pain.    Exercises     PT Diagnosis:    PT Problem List:   PT Treatment Interventions:     PT Goals Acute Rehab PT Goals PT Goal: Rolling Supine to Right Side - Progress: Progressing toward goal PT Goal: Rolling Supine to Left Side - Progress: Progressing toward goal PT Goal: Supine/Side to Sit - Progress: Progressing toward goal PT Goal: Sit to Supine/Side - Progress: Progressing toward goal PT Goal: Sit to Stand - Progress: Progressing toward goal PT Goal: Stand to Sit - Progress: Progressing toward goal PT Transfer Goal: Bed to Chair/Chair to Bed - Progress: Progressing toward goal PT Goal: Ambulate - Progress: Progressing toward goal  Visit Information  Last PT Received On: 09/18/11 Assistance Needed: +1 PT/OT Co-Evaluation/Treatment: Yes    Subjective Data      Cognition  Overall Cognitive Status: Appears within functional limits for tasks assessed/performed Arousal/Alertness: Awake/alert Orientation Level: Appears intact for tasks assessed Behavior During Session: [facility] for tasks performed    Balance     End of Session PT - End of Session Equipment Utilized During Treatment: Gait belt;Back brace Activity Tolerance: Treatment limited secondary to medical complications (Comment) (nausea) Patient left: in bed;with call bell/phone within reach;with nursing in room Nurse Communication: Mobility status;Other (comment) (pt with vomitting)   GP     Milana Kidney 09/18/2011, 1:27 PM

## 2011-09-18 NOTE — Progress Notes (Signed)
Occupational Therapy Treatment Patient Details Name: Lori Mahoney MRN: [id number] DOB: [birth year] Today's Date: 09/18/2011 Time: 7829-5621 OT Time Calculation (min): 19 min  OT Assessment / Plan / Recommendation Comments on Treatment Session Session limited today due to n/v.  Pt demonstrating improved functional mobility. Would benefit from AE education to assist in maximizing independence with ADLs while maintaining back precautions.    Follow Up Recommendations  Home health OT;Supervision/Assistance - 24 hour    Barriers to Discharge       Equipment Recommendations  Tub/shower seat    Recommendations for Other Services    Frequency Min 2X/week   Plan Discharge plan remains appropriate    Precautions / Restrictions Precautions Precautions: Back Precaution Comments: Educated pt on 3/3 back precautions. Required Braces or Orthoses: Spinal Brace Spinal Brace: Lumbar corset;Applied in sitting position   Pertinent Vitals/Pain See vitals    ADL  Toilet Transfer: Performed;Min guard Statistician Method: Sit to Barista: Comfort height toilet Toileting - Clothing Manipulation and Hygiene: Performed;Min guard Where Assessed - Engineer, mining and Hygiene: Sit to stand from 3-in-1 or toilet Equipment Used: Back brace;Rolling walker Transfers/Ambulation Related to ADLs: Min guard with RW throughout room ADL Comments: Pt limited by nausea and vomiting during session. Pt attempting to bend during front peri care in standing and educated pt to maintain precautions.     OT Diagnosis:    OT Problem List:   OT Treatment Interventions:     OT Goals ADL Goals Pt Will Transfer to Toilet: with supervision;Ambulation;with DME;Comfort height toilet;Maintaining back safety precautions ADL Goal: Toilet Transfer - Progress: Progressing toward goals Pt Will Perform Toileting - Clothing Manipulation: with supervision;Standing ADL Goal: Toileting -  Clothing Manipulation - Progress: Progressing toward goals Pt Will Perform Toileting - Hygiene: with supervision;Standing at 3-in-1/toilet ADL Goal: Toileting - Hygiene - Progress: Progressing toward goals Miscellaneous OT Goals Miscellaneous OT Goal #1: Pt will perform bed mobility with supervision in prep for EOB ADLs. OT Goal: Miscellaneous Goal #1 - Progress: Progressing toward goals Miscellaneous OT Goal #2: Pt will independently verbalize and generalize 3/3 back precautions during all ADL activity. OT Goal: Miscellaneous Goal #2 - Progress: Progressing toward goals  Visit Information  Last OT Received On: 09/18/11 Assistance Needed: +1    Subjective Data      Prior Functioning       Cognition  Overall Cognitive Status: Appears within functional limits for tasks assessed/performed Arousal/Alertness: Awake/alert Orientation Level: Appears intact for tasks assessed Behavior During Session: [facility] for tasks performed    Mobility Bed Mobility Bed Mobility: Rolling Right;Right Sidelying to Sit;Sitting - Scoot to Edge of Bed;Sit to Sidelying Right Rolling Right: 5: Supervision Right Sidelying to Sit: 5: Supervision Sitting - Scoot to Edge of Bed: 5: Supervision Sit to Sidelying Right: 4: Min assist Details for Bed Mobility Assistance: Assist to support LEs into bed. Transfers Transfers: Sit to Stand;Stand to Sit Sit to Stand: 4: Min guard;From bed;With upper extremity assist;From toilet Stand to Sit: 4: Min guard;To bed;With upper extremity assist;To toilet Details for Transfer Assistance: Min guard for safety.   Exercises    Balance    End of Session OT - End of Session Equipment Utilized During Treatment: Back brace Activity Tolerance: Other (comment) (nausea and vomiting) Patient left: in bed;with call bell/phone within reach;with nursing in room Nurse Communication: Other (comment) (n/v)  GO    09/18/2011 Cipriano Mile OTR/L Pager [phone] Office  [phone]  Cipriano Mile 09/18/2011, 1:18  PM

## 2011-09-18 NOTE — Progress Notes (Signed)
Subjective: Patient reports Patient is awake alert complaining of some quad pain a lot of stiffness and soreness in her back she is not abdominal yet she is passing gas she is nauseated as well.  Objective: Vital signs in last 24 hours: Temp:  [97.6 F (36.4 C)-99.3 F (37.4 C)] 97.6 F (36.4 C) (08/24 0459) Pulse Rate:  [79-96] 90  (08/24 0459) Resp:  [17-20] 18  (08/24 0459) BP: (137-171)/(52-86) 169/69 mmHg (08/24 0459) SpO2:  [94 %-99 %] 96 % (08/24 0459)  Intake/Output from previous day: 08/23 0701 - 08/24 0700 In: -  Out: 135 [Drains:135] Intake/Output this shift:    Neurologically intact without of 5 strength in her lower 70s her wound is clean and dry work and also thinking about moving will help significantly with her nausea and her pain level.  Lab Results: No results found for this basename: WBC:2,HGB:2,HCT:2,PLT:2 in the last 72 hours BMET No results found for this basename: NA:2,K:2,CL:2,CO2:2,GLUCOSE:2,BUN:2,CREATININE:2,CALCIUM:2 in the last 72 hours  Studies/Results: No results found.  Assessment/Plan: Continue to work on bowels progress mobilization with therapy  LOS: 4 days     Carter Kaman P 09/18/2011, 8:22 AM

## 2011-09-19 ENCOUNTER — Inpatient Hospital Stay (HOSPITAL_COMMUNITY): Payer: Medicare Other

## 2011-09-19 LAB — GLUCOSE, CAPILLARY
Glucose-Capillary: 188 mg/dL — ABNORMAL HIGH (ref 70–99)
Glucose-Capillary: 183 mg/dL — ABNORMAL HIGH (ref 70–99)
Glucose-Capillary: 138 mg/dL — ABNORMAL HIGH (ref 70–99)

## 2011-09-19 NOTE — Progress Notes (Signed)
Patient ID: Lori Mahoney, female   DOB: Jun 22, 1945, 66 y.o.   MRN: [id number] BP 137/45  Pulse 83  Temp 98.5 F (36.9 C) (Oral)  Resp 18  Ht 5' 2.99" (1.6 m)  Wt 91.3 kg (201 lb 4.5 oz)  BMI 35.66 kg/m2  SpO2 96% Complaining of increased right hip and lower extremity pain. Moving it well on manual exam, ~5/5 Wound is clean and dry. No signs of infection. Drain removed. Will order ct lumbar today to look at hardware.

## 2011-09-19 NOTE — Progress Notes (Signed)
Physical Therapy Treatment Patient Details Name: Lori Mahoney MRN: [id number] DOB: [birth year] Today's Date: 09/19/2011 Time: 1027-2536 PT Time Calculation (min): 11 min  PT Assessment / Plan / Recommendation Comments on Treatment Session  Pt able to ambulate further this session, moving very well. Pt with no nausea this session, although increased R hip pain(down to knee) with ambulation and weight bearing. RN aware.  Pt declined stairs this session, will attempt tomorrow pending willingness,.    Follow Up Recommendations  Home health PT;Supervision for mobility/OOB    Barriers to Discharge        Equipment Recommendations  Tub/shower seat;None recommended by PT    Recommendations for Other Services    Frequency Min 5X/week   Plan Discharge plan remains appropriate;Frequency remains appropriate    Precautions / Restrictions Precautions Precautions: Back Precaution Comments: pt abke to verbalize precautions and maintain throughout session Required Braces or Orthoses: Spinal Brace Spinal Brace: Lumbar corset;Applied in sitting position Restrictions Weight Bearing Restrictions: No   Pertinent Vitals/Pain Pt with 10/10 pain with ambulation, RN aware.     Mobility  Bed Mobility Bed Mobility: Rolling Right;Right Sidelying to Sit;Sitting - Scoot to [place] of Bed;Sit to Sidelying Right Rolling Right: 6: Modified independent (Device/Increase time) Right Sidelying to Sit: 6: Modified independent (Device/Increase time) Sitting - Scoot to Edge of Bed: 6: Modified independent (Device/Increase time) Sit to Sidelying Right: 5: Supervision Details for Bed Mobility Assistance: Cueing needed for sit to sidelying as pt in increased pain and twisted getting into bed. Reminded pt of back precautions and safe transition into bed Transfers Transfers: Sit to Stand;Stand to Sit Sit to Stand: 5: Supervision;With upper extremity assist;From bed Stand to Sit: 5: Supervision;With upper extremity  assist;To bed Details for Transfer Assistance: VC for hand placement for safety to/from RW. Ambulation/Gait Ambulation/Gait Assistance: 4: Min guard;5: Supervision Ambulation Distance (Feet): 100 Feet Assistive device: Rolling walker Ambulation/Gait Assistance Details: Pt required supervision for majority of ambulation with minguard towards end of ambulation as R hip pain increased. Pt requring multiple rest breaks secondary to hip pain. Cues for proper posture and safety with distnace to RW.  Gait Pattern: Step-through pattern;Decreased stride length;Trunk flexed;Decreased weight shift to right;Decreased stance time - right;Antalgic Gait velocity: decreased gait speed    Exercises     PT Diagnosis:    PT Problem List:   PT Treatment Interventions:     PT Goals Acute Rehab PT Goals PT Goal: Rolling Supine to Right Side - Progress: Met PT Goal: Rolling Supine to Left Side - Progress: Met PT Goal: Supine/Side to Sit - Progress: Met PT Goal: Sit to Supine/Side - Progress: Progressing toward goal PT Goal: Sit to Stand - Progress: Progressing toward goal PT Goal: Stand to Sit - Progress: Progressing toward goal PT Transfer Goal: Bed to Chair/Chair to Bed - Progress: Progressing toward goal PT Goal: Ambulate - Progress: Progressing toward goal  Visit Information  Last PT Received On: 09/19/11 Assistance Needed: +1    Subjective Data      Cognition  Overall Cognitive Status: Appears within functional limits for tasks assessed/performed Arousal/Alertness: Awake/alert Orientation Level: Appears intact for tasks assessed Behavior During Session: [name] for tasks performed    Balance     End of Session PT - End of Session Equipment Utilized During Treatment: Gait belt;Back brace Activity Tolerance: Treatment limited secondary to medical complications (Comment) Patient left: in bed;with call bell/phone within reach;with nursing in room Nurse Communication: Mobility status   GP  Lori Mahoney 09/19/2011, 1:34 PM

## 2011-09-20 LAB — GLUCOSE, CAPILLARY: Glucose-Capillary: 146 mg/dL — ABNORMAL HIGH (ref 70–99)

## 2011-09-20 MED ORDER — DEXAMETHASONE SODIUM PHOSPHATE 4 MG/ML IJ SOLN
4.0000 mg | Freq: Four times a day (QID) | INTRAMUSCULAR | Status: DC
  Administered 2011-09-20 – 2011-09-21 (×5): 4 mg via INTRAVENOUS
  Filled 2011-09-20 (×8): qty 1

## 2011-09-20 MED ORDER — GABAPENTIN 600 MG PO TABS
600.0000 mg | ORAL_TABLET | Freq: Every day | ORAL | Status: DC
  Administered 2011-09-20 – 2011-09-21 (×2): 600 mg via ORAL
  Filled 2011-09-20 (×4): qty 1

## 2011-09-20 NOTE — Progress Notes (Signed)
Subjective: Patient reports "I had a rough weekend. My right hip and thigh.Marland KitchenMarland KitchenI can't sit on the commode"  Objective: Vital signs in last 24 hours: Temp:  [98 F (36.7 C)-99 F (37.2 C)] 98.3 F (36.8 C) (08/26 0551) Pulse Rate:  [70-83] 80  (08/26 0551) Resp:  [16-19] 17  (08/26 0551) BP: (122-133)/(52-64) 129/62 mmHg (08/26 0551) SpO2:  [95 %-96 %] 96 % (08/26 0551)  Intake/Output from previous day:   Intake/Output this shift:    Opens eyes to voice, conversant, but speech deliberate. Incision without erythema or drainage. Some generalized swelling - nontender. Good strength BLE. Thigh flexion limited by pain bilaterally.   Lab Results: No results found for this basename: WBC:2,HGB:2,HCT:2,PLT:2 in the last 72 hours BMET No results found for this basename: NA:2,K:2,CL:2,CO2:2,GLUCOSE:2,BUN:2,CREATININE:2,CALCIUM:2 in the last 72 hours  Studies/Results: Ct Lumbar Spine Wo Contrast  09/19/2011  *RADIOLOGY REPORT*  Clinical Data: Worsened right lower extremity pain.  Status post extension of prior lumbar fusion to include L2-3 on 09/14/2011.  CT LUMBAR SPINE WITHOUT CONTRAST  Technique:  Multidetector CT imaging of the lumbar spine was performed without intravenous contrast administration. Multiplanar CT image reconstructions were also generated.  Comparison: Post-myelogram CT scan 08/23/2011 and intraoperative fluoroscopic spot views lumbar spine 09/14/2011.  Findings: Numbering scheme is the same as that employed on the prior study with rudimentary disc material at the S1-2 level.  As seen on intraoperative views, previous L3-S1 fusion has been extended to include the L2-3 level.  Trace retrolisthesis of L2 on L3 is unchanged.  The pedicle screws in L2 just traverse the lateral recesses.  The L3 and L4 screws appear adequately positioned and unremarkable.  There is some new mild lucency about the left L5 screw.  The right L5 screw just traverses the lateral recess, unchanged.  The right  S1 screw traverses the lateral recess, unchanged.  Left S1 screw appears normal.  The right stabilization bar has been cut at the L4 level.  The left stabilization bars extend across the L2-3 level and then from L4- S1.  L2-3:  There is a fluid collection in the posterior soft tissues at the laminectomy site.  Multiple locules of gas are identified in the laminectomy bed and likely be due to the presence of Gelfoam. The central canal appears decompressed and the foramina appear open.  L3-4: Calcified central disc protrusion is unchanged.  Foramina appear open.  Wide laminectomy defect noted.  L4-5:  Solid bridging bone is present across the L4-5 level with a wide laminectomy defect identified. The appearance is unchanged.  L5-S1:  Laminectomy defect identified.  The appearance is unchanged.  S1-2:  Unchanged.  IMPRESSION:  1.  Interval extension of L3-S1 fusion to include the L2-3 level. The L2 screws just traverse the far periphery of the lateral recesses. 2.  Fluid collection posterior to the surgical site with locules of gas in the laminectomy bed presumably represent postoperative change but cannot be definitively characterized.  If there is concern for infection, MRI with and without contrast is recommend for further evaluation. 3.  Apparent lucency about the left L5 screw may be related to differences in scanning technique. Loosening/infection are less likely given the absence of surgical intervention at this level.   Original Report Authenticated By: Bernadene Bell. Maricela Curet, M.D.     Assessment/Plan: Improving slowly   LOS: 6 days  Continue to mobilize in LSO. Dr. Venetia Maxon will visit this am to further assess persistent pain. Pt will request muscle relaxer today instead  of only pain med.   Georgiann Cocker 09/20/2011, 9:49 AM

## 2011-09-20 NOTE — Progress Notes (Signed)
Physical Therapy Treatment Patient Details Name: Tanessa C [place] MRN: [id number] DOB: 28-Apr-1945 Today's Date: 09/20/2011 Time: 4098-1191 PT Time Calculation (min): 24 min  PT Assessment / Plan / Recommendation Comments on Treatment Session  Patient progressing towards goals. Able to practice steps this session without a large increase in pain. Daughter present throughout    Follow Up Recommendations  Home health PT;Supervision for mobility/OOB    Barriers to Discharge        Equipment Recommendations  Tub/shower seat;None recommended by PT    Recommendations for Other Services    Frequency Min 5X/week   Plan Discharge plan remains appropriate;Frequency remains appropriate    Precautions / Restrictions Precautions Precautions: Back Precaution Comments: Patient able to recall all precautions.  Spinal Brace: Lumbar corset;Applied in sitting position   Pertinent Vitals/Pain     Mobility  Bed Mobility Rolling Left: 6: Modified independent (Device/Increase time) Right Sidelying to Sit: 6: Modified independent (Device/Increase time) Sitting - Scoot to Edge of Bed: 6: Modified independent (Device/Increase time) Transfers Sit to Stand: 5: Supervision;With upper extremity assist;From bed Stand to Sit: 5: Supervision;With upper extremity assist;To chair/3-in-1 Details for Transfer Assistance: cues for safe hand placement Ambulation/Gait Ambulation/Gait Assistance: 4: Min guard;5: Supervision Ambulation Distance (Feet): 200 Feet Assistive device: Rolling walker Ambulation/Gait Assistance Details: Cues for RW placement. Continues to have increawsed pain with ambulation requiring Minguard A at end of gait Gait Pattern: Step-through pattern;Decreased stride length;Trunk flexed;Antalgic Stairs: Yes Stairs Assistance: 4: Min assist Stair Management Technique: One rail Left;Forwards;Step to pattern Number of Stairs: 4     Exercises     PT Diagnosis:    PT Problem List:   PT  Treatment Interventions:     PT Goals Acute Rehab PT Goals PT Goal: Supine/Side to Sit - Progress: Progressing toward goal PT Goal: Sit to Stand - Progress: Progressing toward goal PT Goal: Stand to Sit - Progress: Progressing toward goal PT Transfer Goal: Bed to Chair/Chair to Bed - Progress: Progressing toward goal PT Goal: Ambulate - Progress: Progressing toward goal PT Goal: Up/Down Stairs - Progress: Progressing toward goal  Visit Information  Last PT Received On: 09/20/11 Assistance Needed: +1    Subjective Data      Cognition  Overall Cognitive Status: Appears within functional limits for tasks assessed/performed Arousal/Alertness: Awake/alert Orientation Level: Appears intact for tasks assessed Behavior During Session: [facility] for tasks performed    Balance     End of Session PT - End of Session Equipment Utilized During Treatment: Gait belt;Back brace Activity Tolerance: Patient tolerated treatment well Patient left: in chair;with call bell/phone within reach Nurse Communication: Mobility status   GP     Fredrich Birks 09/20/2011, 3:02 PM 09/20/2011 Fredrich Birks PTA [phone] pager [phone] office

## 2011-09-20 NOTE — Progress Notes (Signed)
I have reviewed CT scan, which shows new hardware well positioned.  Will give patient decadron taper to see if this helps her pain.  Strength is full on confrontational testing.

## 2011-09-20 NOTE — Progress Notes (Signed)
Occupational Therapy Treatment Patient Details Name: Lori Mahoney MRN: [id number] DOB: 11/24/1945 Today's Date: 09/20/2011 Time: 2956-2130 OT Time Calculation (min): 15 min  OT Assessment / Plan / Recommendation Comments on Treatment Session Pt. continues to be limited by pain.  Will benefit from AE instruction    Follow Up Recommendations  Home health OT;Supervision/Assistance - 24 hour    Barriers to Discharge       Equipment Recommendations  Tub/shower seat;None recommended by PT    Recommendations for Other Services    Frequency Min 2X/week   Plan Discharge plan remains appropriate    Precautions / Restrictions Precautions Precautions: Back Precaution Comments: Patient able to recall 2/3 precautions and requires min verbal cues.  Required Braces or Orthoses: Spinal Brace Spinal Brace: Lumbar corset;Applied in sitting position Restrictions Weight Bearing Restrictions: No   Pertinent Vitals/Pain     ADL  Grooming: Performed;Wash/dry hands;Brushing hair;Supervision/safety Where Assessed - Grooming: Supported standing Lower Body Dressing: Performed;Simulated;Maximal assistance Where Assessed - Lower Body Dressing: Supported sit to Pharmacist, hospital: Research scientist (life sciences) Method: Sit to Barista: Comfort height toilet Toileting - Architect and Hygiene: Supervision/safety Where Assessed - Engineer, mining and Hygiene: Standing Equipment Used: Back brace;Rolling walker Transfers/Ambulation Related to ADLs: supervision in room with RW ADL Comments: Pt. unable to access feet - discussed AE with pt.  She does report husband will likely assist her, but would benefit from AE instruction.  Pt. with Rt. hip pain which limited participation    OT Diagnosis:    OT Problem List:   OT Treatment Interventions:     OT Goals ADL Goals ADL Goal: Grooming - Progress: Met ADL Goal: Lower Body Dressing -  Progress: Not progressing ADL Goal: Toilet Transfer - Progress: Progressing toward goals ADL Goal: Toileting - Clothing Manipulation - Progress: Progressing toward goals ADL Goal: Toileting - Hygiene - Progress: Progressing toward goals Miscellaneous OT Goals OT Goal: Miscellaneous Goal #1 - Progress: Met OT Goal: Miscellaneous Goal #2 - Progress: Progressing toward goals  Visit Information  Last OT Received On: 09/20/11 Assistance Needed: +1    Subjective Data      Prior Functioning       Cognition  Overall Cognitive Status: Appears within functional limits for tasks assessed/performed Arousal/Alertness: Awake/alert Orientation Level: Appears intact for tasks assessed Behavior During Session: [facility] for tasks performed    Mobility Bed Mobility Bed Mobility: Rolling Right;Right Sidelying to Sit;Sitting - Scoot to [place] of Bed;Sit to Sidelying Right Rolling Right: 6: Modified independent (Device/Increase time) Rolling Left: 6: Modified independent (Device/Increase time) Right Sidelying to Sit: 6: Modified independent (Device/Increase time) Sitting - Scoot to Edge of Bed: 6: Modified independent (Device/Increase time) Sit to Sidelying Right: 5: Supervision Transfers Transfers: Sit to Stand;Stand to Sit Sit to Stand: 5: Supervision;With upper extremity assist;From bed;From toilet Stand to Sit: 5: Supervision;With upper extremity assist;To bed;To toilet Details for Transfer Assistance: cues for safe hand placement   Exercises    Balance    End of Session OT - End of Session Equipment Utilized During Treatment: Back brace Activity Tolerance: Patient limited by pain Patient left: in bed;with call bell/phone within reach  GO     Lori Mahoney M 09/20/2011, 4:36 PM

## 2011-09-21 LAB — GLUCOSE, CAPILLARY
Glucose-Capillary: 237 mg/dL — ABNORMAL HIGH (ref 70–99)
Glucose-Capillary: 249 mg/dL — ABNORMAL HIGH (ref 70–99)

## 2011-09-21 MED ORDER — INSULIN ASPART 100 UNIT/ML ~~LOC~~ SOLN
0.0000 [IU] | Freq: Three times a day (TID) | SUBCUTANEOUS | Status: DC
  Administered 2011-09-21: 11 [IU] via SUBCUTANEOUS
  Administered 2011-09-21: 5 [IU] via SUBCUTANEOUS
  Administered 2011-09-22: 8 [IU] via SUBCUTANEOUS

## 2011-09-21 NOTE — Plan of Care (Signed)
Problem: Consults Goal: Diagnosis - Spinal Surgery Outcome: Completed/Met Date Met:  09/21/11 [place]/Lumbar Spine Fusion

## 2011-09-21 NOTE — Progress Notes (Signed)
As above.

## 2011-09-21 NOTE — Progress Notes (Signed)
Occupational Therapy Treatment Patient Details Name: Lori Mahoney MRN: [id number] DOB: [birth year] Today's Date: 09/21/2011 Time: 1478-2956 OT Time Calculation (min): 24 min  OT Assessment / Plan / Recommendation Comments on Treatment Session Pt with increased I today with all adls.  Pt should be ready for d/c soon.    Follow Up Recommendations  Home health OT;Supervision/Assistance - 24 hour    Barriers to Discharge       Equipment Recommendations  Tub/shower seat;None recommended by PT    Recommendations for Other Services    Frequency Min 2X/week   Plan Discharge plan remains appropriate    Precautions / Restrictions Precautions Precautions: Back Precaution Comments: Pt recalled 3/3 precautions. Required Braces or Orthoses: Spinal Brace Spinal Brace: Lumbar corset;Applied in sitting position Restrictions Weight Bearing Restrictions: No   Pertinent Vitals/Pain Pt with pain of 4/10.  Nursing aware.    ADL  Eating/Feeding: Performed;Independent Where Assessed - Eating/Feeding: Chair Grooming: Performed;Wash/dry hands;Wash/dry face;Supervision/safety Where Assessed - Grooming: Supported standing Upper Body Bathing: Performed;Set up Where Assessed - Upper Body Bathing: Unsupported sitting Lower Body Bathing: Performed;Minimal assistance Where Assessed - Lower Body Bathing: Unsupported sit to stand Upper Body Dressing: Performed;Set up Where Assessed - Upper Body Dressing: Unsupported sitting Lower Body Dressing: Performed;Minimal assistance (w AE) Where Assessed - Lower Body Dressing: Unsupported sit to stand Toilet Transfer: Performed;Supervision/safety Toilet Transfer Method: Sit to Barista: Comfort height toilet Toileting - Clothing Manipulation and Hygiene: Supervision/safety Where Assessed - Engineer, mining and Hygiene: Standing Equipment Used: Back brace;Rolling walker Transfers/Ambulation Related to ADLs: S in room with  RW. ADL Comments: Pt educated  on use of AE. Pt demonstrated I with equipment and told where to purchase it.    OT Diagnosis:    OT Problem List:   OT Treatment Interventions:     OT Goals Acute Rehab OT Goals OT Goal Formulation: With patient Time For Goal Achievement: 09/22/11 Potential to Achieve Goals: Good ADL Goals Pt Will Perform Grooming: with supervision;Standing at sink ADL Goal: Grooming - Progress: Met Pt Will Perform Lower Body Bathing: with supervision;Sit to stand from chair;Sit to stand from bed;with adaptive equipment ADL Goal: Lower Body Bathing - Progress: Progressing toward goals Pt Will Perform Lower Body Dressing: with supervision;Sit to stand from chair;Sit to stand from bed;with adaptive equipment ADL Goal: Lower Body Dressing - Progress: Met Pt Will Transfer to Toilet: with supervision;Ambulation;with DME;Comfort height toilet;Maintaining back safety precautions ADL Goal: Toilet Transfer - Progress: Met Pt Will Perform Toileting - Clothing Manipulation: with supervision;Standing ADL Goal: Toileting - Clothing Manipulation - Progress: Met Pt Will Perform Toileting - Hygiene: with supervision;Standing at 3-in-1/toilet ADL Goal: Toileting - Hygiene - Progress: Met Miscellaneous OT Goals Miscellaneous OT Goal #1: Pt will perform bed mobility with supervision in prep for EOB ADLs. OT Goal: Miscellaneous Goal #1 - Progress: Met Miscellaneous OT Goal #2: Pt will independently verbalize and generalize 3/3 back precautions during all ADL activity. OT Goal: Miscellaneous Goal #2 - Progress: Met  Visit Information  Last OT Received On: 09/21/11 Assistance Needed: +1    Subjective Data      Prior Functioning       Cognition  Overall Cognitive Status: Appears within functional limits for tasks assessed/performed Arousal/Alertness: Awake/alert Orientation Level: Appears intact for tasks assessed Behavior During Session: [facility] for tasks performed Cognition -  Other Comments: Seems I.    Mobility Bed Mobility Bed Mobility: Rolling Right;Right Sidelying to Sit;Sitting - Scoot to Edge of Bed;Sit to Supine  Rolling Right: 6: Modified independent (Device/Increase time) Rolling Left: 6: Modified independent (Device/Increase time) Right Sidelying to Sit: 6: Modified independent (Device/Increase time) Left Sidelying to Sit: 5: Supervision;With rails Sitting - Scoot to Edge of Bed: 6: Modified independent (Device/Increase time) Sit to Supine: 7: Independent Sit to Sidelying Right: 5: Supervision Details for Bed Mobility Assistance: Very safe adn I with bed mobility today. Transfers Transfers: Sit to Stand;Stand to Sit Sit to Stand: 5: Supervision Stand to Sit: 5: Supervision Details for Transfer Assistance: cues for safe hand placement   Exercises    Balance    End of Session OT - End of Session Equipment Utilized During Treatment: Back brace Activity Tolerance: Patient tolerated treatment well Patient left: in bed;with call bell/phone within reach  GO     Cherese, Lozano 09/21/2011, 11:16 AM [phone]

## 2011-09-21 NOTE — Care Management Note (Signed)
    Page 1 of 2   09/22/2011     1:28:56 PM   CARE MANAGEMENT NOTE 09/22/2011  Patient:  Lori Mahoney, Lori Mahoney   Account Number:  [id number]  Date Initiated:  09/21/2011  Documentation initiated by:  Onnie Boer  Subjective/Objective Assessment:   PT WAS ADMITTED WITH BACK SURGERY     Action/Plan:   PROGRESSION OF CARE AND DISCHARGE PLANNING   Anticipated DC Date:  09/22/2011   Anticipated DC Plan:  HOME W HOME HEALTH SERVICES      DC Planning Services  CM consult      Choice offered to / List presented to:  C-1 Patient   DME arranged  WALKER - PLATFORM      DME agency  [facility]     HH arranged  HH-2 PT  HH-3 OT      [facility] agency  [facility]   Status of service:  Completed, signed off Medicare Important Message given?   (If response is "NO", the following Medicare IM given date fields will be blank) Date Medicare IM given:   Date Additional Medicare IM given:    Discharge Disposition:  HOME W HOME HEALTH SERVICES  Per UR Regulation:  Reviewed for med. necessity/level of care/duration of stay  If discussed at Long Length of Stay Meetings, dates discussed:    Comments:  09/22/11 Onnie Boer, RN, BSN 1327 PT WAS DC'D TO HOME WITH HH PT/OT/RW.  PT AND SPOUSE HAS REFUSED THE SHOWER CHAIR.  09/21/11 Onnie Boer, RN, BSN 1126 PT WAS ADMITTED FOR BACK SURGERY.  PT SHOULD BE DC'D WITH HH AND A TUB SEAT FROM [facility].  WILL F/U.  PT ON IV STERIODS AND HAVING PAIN ON PO'S.  PT ALSO HAVING HIGH BS AND STARTED ON AN INSULIN REGIMEN.

## 2011-09-21 NOTE — Progress Notes (Signed)
Physical Therapy Treatment Patient Details Name: Lori Mahoney MRN: [id number] DOB: 17-May-1945 Today's Date: 09/21/2011 Time: 8657-8469 PT Time Calculation (min): 16 min  PT Assessment / Plan / Recommendation Comments on Treatment Session  Patient progressing well. Anticipate patient at level to be discharged soon.    Follow Up Recommendations  Home health PT;Supervision for mobility/OOB    Barriers to Discharge        Equipment Recommendations  None recommended by PT;Tub/shower seat    Recommendations for Other Services    Frequency Min 5X/week   Plan Discharge plan remains appropriate;Frequency remains appropriate    Precautions / Restrictions Precautions Precautions: Back Precaution Comments: Patient able to recall all precautions Required Braces or Orthoses: Spinal Brace Spinal Brace: Lumbar corset;Applied in sitting position Restrictions Weight Bearing Restrictions: No   Pertinent Vitals/Pain     Mobility  Bed Mobility Bed Mobility: Rolling Right;Right Sidelying to Sit;Sitting - Scoot to [place] of Bed;Sit to Supine Rolling Right: 6: Modified independent (Device/Increase time) Rolling Left: 6: Modified independent (Device/Increase time) Right Sidelying to Sit: 6: Modified independent (Device/Increase time) Left Sidelying to Sit: 5: Supervision;With rails Sitting - Scoot to Edge of Bed: 6: Modified independent (Device/Increase time) Sit to Supine: 7: Independent Sit to Sidelying Right: 5: Supervision Details for Bed Mobility Assistance: Very safe adn I with bed mobility today. Transfers Sit to Stand: 5: Supervision Stand to Sit: 5: Supervision Details for Transfer Assistance: Cues for safe hand placement Ambulation/Gait Ambulation/Gait Assistance: 5: Supervision Ambulation Distance (Feet): 400 Feet Assistive device: Rolling walker Gait Pattern: Step-through pattern;Decreased stride length    Exercises     PT Diagnosis:    PT Problem List:   PT Treatment  Interventions:     PT Goals Acute Rehab PT Goals PT Goal: Rolling Supine to Right Side - Progress: Met PT Goal: Supine/Side to Sit - Progress: Met PT Goal: Sit to Stand - Progress: Progressing toward goal PT Goal: Stand to Sit - Progress: Progressing toward goal PT Transfer Goal: Bed to Chair/Chair to Bed - Progress: Progressing toward goal PT Goal: Ambulate - Progress: Progressing toward goal Additional Goals PT Goal: Additional Goal #1 - Progress: Progressing toward goal  Visit Information  Last PT Received On: 09/21/11 Assistance Needed: +1    Subjective Data      Cognition  Overall Cognitive Status: Appears within functional limits for tasks assessed/performed Arousal/Alertness: Awake/alert Orientation Level: Appears intact for tasks assessed Behavior During Session: [facility] for tasks performed Cognition - Other Comments: Seems I.    Balance     End of Session PT - End of Session Equipment Utilized During Treatment: Gait belt;Back brace Activity Tolerance: Patient tolerated treatment well Patient left: in chair;with call bell/phone within reach Nurse Communication: Mobility status   GP     Fredrich Birks 09/21/2011, 2:04 PM 09/21/2011 Fredrich Birks PTA [phone] pager [phone] office

## 2011-09-21 NOTE — Progress Notes (Signed)
Subjective: Patient reports "The pain is some better than yesterday."   Objective: Vital signs in last 24 hours: Temp:  [97.4 F (36.3 C)-99 F (37.2 C)] 97.4 F (36.3 C) (08/27 0252) Pulse Rate:  [73-81] 74  (08/27 0252) Resp:  [16-18] 17  (08/27 0252) BP: (116-137)/(43-62) 137/62 mmHg (08/27 0252) SpO2:  [93 %-96 %] 96 % (08/27 0252)  Intake/Output from previous day:   Intake/Output this shift:    Alert, conversant. Good strength BLE. Incision with Steri's intact. No erythema, swelling, or drainage. Pt reports some decrease in right hip & thigh pain since start of Decadron yesterday.  Lab Results: No results found for this basename: WBC:2,HGB:2,HCT:2,PLT:2 in the last 72 hours BMET No results found for this basename: NA:2,K:2,CL:2,CO2:2,GLUCOSE:2,BUN:2,CREATININE:2,CALCIUM:2 in the last 72 hours  Studies/Results: Ct Lumbar Spine Wo Contrast  09/19/2011  *RADIOLOGY REPORT*  Clinical Data: Worsened right lower extremity pain.  Status post extension of prior lumbar fusion to include L2-3 on 09/14/2011.  CT LUMBAR SPINE WITHOUT CONTRAST  Technique:  Multidetector CT imaging of the lumbar spine was performed without intravenous contrast administration. Multiplanar CT image reconstructions were also generated.  Comparison: Post-myelogram CT scan 08/23/2011 and intraoperative fluoroscopic spot views lumbar spine 09/14/2011.  Findings: Numbering scheme is the same as that employed on the prior study with rudimentary disc material at the S1-2 level.  As seen on intraoperative views, previous L3-S1 fusion has been extended to include the L2-3 level.  Trace retrolisthesis of L2 on L3 is unchanged.  The pedicle screws in L2 just traverse the lateral recesses.  The L3 and L4 screws appear adequately positioned and unremarkable.  There is some new mild lucency about the left L5 screw.  The right L5 screw just traverses the lateral recess, unchanged.  The right S1 screw traverses the lateral recess,  unchanged.  Left S1 screw appears normal.  The right stabilization bar has been cut at the L4 level.  The left stabilization bars extend across the L2-3 level and then from L4- S1.  L2-3:  There is a fluid collection in the posterior soft tissues at the laminectomy site.  Multiple locules of gas are identified in the laminectomy bed and likely be due to the presence of Gelfoam. The central canal appears decompressed and the foramina appear open.  L3-4: Calcified central disc protrusion is unchanged.  Foramina appear open.  Wide laminectomy defect noted.  L4-5:  Solid bridging bone is present across the L4-5 level with a wide laminectomy defect identified. The appearance is unchanged.  L5-S1:  Laminectomy defect identified.  The appearance is unchanged.  S1-2:  Unchanged.  IMPRESSION:  1.  Interval extension of L3-S1 fusion to include the L2-3 level. The L2 screws just traverse the far periphery of the lateral recesses. 2.  Fluid collection posterior to the surgical site with locules of gas in the laminectomy bed presumably represent postoperative change but cannot be definitively characterized.  If there is concern for infection, MRI with and without contrast is recommend for further evaluation. 3.  Apparent lucency about the left L5 screw may be related to differences in scanning technique. Loosening/infection are less likely given the absence of surgical intervention at this level.   Original Report Authenticated By: Bernadene Bell. Maricela Curet, M.D.     Assessment/Plan: Improving slowly  LOS: 7 days  Continue to mobilize in LSO with PT; monitor pain control. Glycemic control (moderate) orders entered per Dr. Venetia Maxon.    Georgiann Cocker 09/21/2011, 8:06 AM

## 2011-09-22 LAB — GLUCOSE, CAPILLARY: Glucose-Capillary: 185 mg/dL — ABNORMAL HIGH (ref 70–99)

## 2011-09-22 MED ORDER — HYDROMORPHONE HCL 2 MG PO TABS
2.0000 mg | ORAL_TABLET | Freq: Three times a day (TID) | ORAL | Status: AC | PRN
Start: 2011-09-22 — End: 2011-10-02

## 2011-09-22 MED ORDER — MORPHINE SULFATE 15 MG PO TABS: 15.0000 mg | ORAL_TABLET | ORAL | Status: AC | PRN

## 2011-09-22 NOTE — Progress Notes (Signed)
Physical Therapy Treatment Patient Details Name: Lori Mahoney MRN: [id number] DOB: 1945/06/30 Today's Date: 09/22/2011 Time: 0850-0906 PT Time Calculation (min): 16 min  PT Assessment / Plan / Recommendation Comments on Treatment Session  Patient progressing well. Plans on DCing today. Goals not updated due to DC    Follow Up Recommendations  Home health PT;Supervision for mobility/OOB    Barriers to Discharge        Equipment Recommendations  Tub/shower seat;None recommended by PT    Recommendations for Other Services    Frequency Min 5X/week   Plan Discharge plan remains appropriate;Frequency remains appropriate    Precautions / Restrictions Precautions Precautions: Back Spinal Brace: Lumbar corset;Applied in sitting position   Pertinent Vitals/Pain 7/10 back pain.     Mobility  Bed Mobility Sit to Supine: 6: Modified independent (Device/Increase time) Sit to Sidelying Left: 6: Modified independent (Device/Increase time) Transfers Sit to Stand: 6: Modified independent (Device/Increase time) Stand to Sit: 6: Modified independent (Device/Increase time) Ambulation/Gait Ambulation/Gait Assistance: 5: Supervision Ambulation Distance (Feet): 400 Feet Ambulation/Gait Assistance Details: Supervision due to increase pain. Cues to step into RW Gait Pattern: Step-through pattern;Decreased stride length Stairs: Yes Stairs Assistance: 5: Supervision Stairs Assistance Details (indicate cue type and reason): Cues for safe technique with use of cane.  Stair Management Technique: Step to pattern;Forwards;With cane;One rail Left Number of Stairs: 5     Exercises     PT Diagnosis:    PT Problem List:   PT Treatment Interventions:     PT Goals Acute Rehab PT Goals PT Goal: Rolling Supine to Right Side - Progress: Met PT Goal: Rolling Supine to Left Side - Progress: Met PT Goal: Supine/Side to Sit - Progress: Met PT Goal: Sit to Supine/Side - Progress: Met PT Goal: Sit to  Stand - Progress: Met PT Goal: Stand to Sit - Progress: Met PT Transfer Goal: Bed to Chair/Chair to Bed - Progress: Met PT Goal: Ambulate - Progress: Met PT Goal: Up/Down Stairs - Progress: Met Additional Goals PT Goal: Additional Goal #1 - Progress: Met  Visit Information  Last PT Received On: 09/22/11 Assistance Needed: +1    Subjective Data      Cognition  Overall Cognitive Status: Appears within functional limits for tasks assessed/performed Arousal/Alertness: Awake/alert Orientation Level: Appears intact for tasks assessed Behavior During Session: [facility] for tasks performed    Balance     End of Session PT - End of Session Equipment Utilized During Treatment: Gait belt;Back brace Activity Tolerance: Patient tolerated treatment well Patient left: in bed;with call bell/phone within reach Nurse Communication: Mobility status   GP     Fredrich Birks 09/22/2011, 9:46 AM 09/22/2011 Fredrich Birks PTA [phone] pager [phone] office

## 2011-09-22 NOTE — Discharge Summary (Signed)
Physician Discharge Summary  Patient ID: Lori Mahoney MRN: [id number] DOB/AGE: 10/22/1945 66 y.o.  Admit date: 09/14/2011 Discharge date: 09/22/2011  Admission Diagnoses:  Discharge Diagnoses:  Active Problems:  * No active hospital problems. *    Discharged Condition: good  Hospital Course: Underwent posterior decompression and fusion L2/3 which was uncomplicated.  Postop mobility was slow due to right hip pain which resolved.  Consults: None  Significant Diagnostic Studies: radiology: CT scan: Assess fusion and hardware  Treatments: surgery: posterior decompression and fusion L2/3   Discharge Exam: Blood pressure 129/58, pulse 68, temperature 97.6 F (36.4 C), temperature source Oral, resp. rate 18, height 5' 2.99" (1.6 m), weight 91.3 kg (201 lb 4.5 oz), SpO2 95.00%. Neurologic: Alert and oriented X 3, normal strength and tone. Normal symmetric reflexes. Normal coordination and gait Wound:CDI  Disposition: Home   Medication List  As of 09/22/2011  8:38 AM   STOP taking these medications         ibuprofen 200 MG tablet      naproxen sodium 220 MG tablet         TAKE these medications         ACIDOPHILUS PO   Take 1 tablet by mouth daily as needed. For constipation      gabapentin 300 MG capsule   Commonly known as: NEURONTIN   Take 600 mg by mouth at bedtime.      glyBURIDE 5 MG tablet   Commonly known as: DIABETA   Take 5-10 mg by mouth. Patient is taking 2 in the morning and 1 at night      HYDROmorphone 2 MG tablet   Commonly known as: DILAUDID   Take 1 tablet (2 mg total) by mouth every 8 (eight) hours as needed.      morphine 15 MG tablet   Commonly known as: MSIR   Take 1 tablet (15 mg total) by mouth every 4 (four) hours as needed for pain.      OVER THE COUNTER MEDICATION   Take 1 capsule by mouth daily as needed. Equate acid reflux    For acid reflux      pravastatin 40 MG tablet   Commonly known as: PRAVACHOL   Take 40 mg by mouth at  bedtime.      verapamil 240 MG CR tablet   Commonly known as: CALAN-SR   Take 240 mg by mouth daily with breakfast.             Signed: Dorian Heckle, MD 09/22/2011, 8:38 AM

## 2011-09-22 NOTE — Progress Notes (Signed)
Patient discharge instructions and education given to patient and husband. Rx given to husband.  All questions answered to patient's satisfaction. Pt D/C home with no signs of acute distress.

## 2011-09-22 NOTE — Progress Notes (Signed)
Subjective: Patient reports improving leg pain  Objective: Vital signs in last 24 hours: Temp:  [97.5 F (36.4 C)-97.8 F (36.6 C)] 97.6 F (36.4 C) (08/28 0600) Pulse Rate:  [61-78] 68  (08/28 0600) Resp:  [16-18] 18  (08/28 0600) BP: (129-150)/(58-65) 129/58 mmHg (08/28 0600) SpO2:  [95 %-99 %] 95 % (08/28 0600)  Intake/Output from previous day:   Intake/Output this shift:    Physical Exam: Full strength, dressing CDI  Lab Results: No results found for this basename: WBC:2,HGB:2,HCT:2,PLT:2 in the last 72 hours BMET No results found for this basename: NA:2,K:2,CL:2,CO2:2,GLUCOSE:2,BUN:2,CREATININE:2,CALCIUM:2 in the last 72 hours  Studies/Results: No results found.  Assessment/Plan: D/C home.  F/U in office 3 weeks.    LOS: 8 days    Dorian Heckle, MD 09/22/2011, 7:03 AM

## 2011-09-22 NOTE — Progress Notes (Signed)
Occupational Therapy Treatment Patient Details Name: Lori Mahoney MRN: [id number] DOB: [birth year] Today's Date: 09/22/2011 Time: 4098-1191 OT Time Calculation (min): 10 min  OT Assessment / Plan / Recommendation Comments on Treatment Session Pt to d/c home this afternoon per RN report.      Follow Up Recommendations  Home health OT;Supervision/Assistance - 24 hour    Barriers to Discharge       Equipment Recommendations  Tub/shower seat;None recommended by PT    Recommendations for Other Services    Frequency Min 2X/week   Plan Discharge plan remains appropriate    Precautions / Restrictions Precautions Precautions: Back Precaution Comments: Patient able to recall all precautions Required Braces or Orthoses: Spinal Brace Spinal Brace: Lumbar corset;Applied in sitting position   Pertinent Vitals/Pain See vitals    ADL  Lower Body Bathing:  (discussed technique with pt) Lower Body Dressing:  (discussed technique with pt) Transfers/Ambulation Related to ADLs: pt declining OOB activity. ADL Comments: Pt reporting that she does not want to get OOB at this time as she was already up with PT earlier this AM.  Reviewed ADL education with pt as well as back precautions.  Pt independently verbalized back precautions as well as LB bathing/dressing techniques.  Pt reports that she has purchased AE that was demonstrated by OT in previous sessions.  Pt reports that she feels ready to go home.    OT Diagnosis:    OT Problem List:   OT Treatment Interventions:     OT Goals ADL Goals Pt Will Perform Lower Body Bathing: with supervision;Sit to stand from chair;Sit to stand from bed;with adaptive equipment ADL Goal: Lower Body Bathing - Progress: Progressing toward goals Pt Will Perform Lower Body Dressing: with supervision;Sit to stand from chair;Sit to stand from bed;with adaptive equipment ADL Goal: Lower Body Dressing - Progress: Progressing toward goals Miscellaneous OT  Goals Miscellaneous OT Goal #1: Pt will perform bed mobility with supervision in prep for EOB ADLs. OT Goal: Miscellaneous Goal #1 - Progress: Met Miscellaneous OT Goal #2: Pt will independently verbalize and generalize 3/3 back precautions during all ADL activity. OT Goal: Miscellaneous Goal #2 - Progress: Met  Visit Information  Last OT Received On: 09/22/11 Assistance Needed: +1    Subjective Data      Prior Functioning       Cognition  Overall Cognitive Status: Appears within functional limits for tasks assessed/performed Arousal/Alertness: Awake/alert Orientation Level: Appears intact for tasks assessed Behavior During Session: [facility] for tasks performed    Mobility   Bed Mobility Rolling Right: 6: Modified independent (Device/Increase time) Right Sidelying to Sit: 6: Modified independent (Device/Increase time) Sitting - Scoot to Edge of Bed: 6: Modified independent (Device/Increase time) Sit to Supine: 6: Modified independent (Device/Increase time) Sit to Sidelying Right: 6: Modified independent (Device/Increase time) Sit to Sidelying Left: 6: Modified independent (Device/Increase time) Transfers Transfers: Not assessed Sit to Stand: 6: Modified independent (Device/Increase time) Stand to Sit: 6: Modified independent (Device/Increase time)       Exercises      Balance     End of Session OT - End of Session Activity Tolerance: Patient tolerated treatment well Patient left: in bed;with call bell/phone within reach  GO    09/22/2011 Cipriano Mile OTR/L Pager [phone] Office [phone]  Cipriano Mile 09/22/2011, 10:38 AM

## 2011-10-07 ENCOUNTER — Emergency Department (HOSPITAL_COMMUNITY): Payer: Medicare Other

## 2011-10-07 ENCOUNTER — Inpatient Hospital Stay (HOSPITAL_COMMUNITY)
Admission: EM | Admit: 2011-10-07 | Discharge: 2011-10-11 | DRG: 689 | Disposition: A | Discharge status: Skilled Nursing Facility | Payer: Medicare Other | Attending: Pulmonary Disease | Admitting: Pulmonary Disease

## 2011-10-07 ENCOUNTER — Encounter (HOSPITAL_COMMUNITY): Payer: Self-pay | Admitting: *Deleted

## 2011-10-07 DIAGNOSIS — Z981 Arthrodesis status: Secondary | ICD-10-CM

## 2011-10-07 DIAGNOSIS — M5136 Other intervertebral disc degeneration, lumbar region: Secondary | ICD-10-CM | POA: Diagnosis present

## 2011-10-07 DIAGNOSIS — M129 Arthropathy, unspecified: Secondary | ICD-10-CM | POA: Diagnosis present

## 2011-10-07 DIAGNOSIS — E860 Dehydration: Secondary | ICD-10-CM

## 2011-10-07 DIAGNOSIS — I10 Essential (primary) hypertension: Secondary | ICD-10-CM | POA: Diagnosis present

## 2011-10-07 DIAGNOSIS — Z91041 Radiographic dye allergy status: Secondary | ICD-10-CM

## 2011-10-07 DIAGNOSIS — G589 Mononeuropathy, unspecified: Secondary | ICD-10-CM | POA: Diagnosis present

## 2011-10-07 DIAGNOSIS — R5381 Other malaise: Secondary | ICD-10-CM | POA: Diagnosis present

## 2011-10-07 DIAGNOSIS — Z886 Allergy status to analgesic agent status: Secondary | ICD-10-CM

## 2011-10-07 DIAGNOSIS — A498 Other bacterial infections of unspecified site: Secondary | ICD-10-CM | POA: Diagnosis present

## 2011-10-07 DIAGNOSIS — E119 Type 2 diabetes mellitus without complications: Secondary | ICD-10-CM | POA: Diagnosis present

## 2011-10-07 DIAGNOSIS — M51379 Other intervertebral disc degeneration, lumbosacral region without mention of lumbar back pain or lower extremity pain: Secondary | ICD-10-CM | POA: Diagnosis present

## 2011-10-07 DIAGNOSIS — G9341 Metabolic encephalopathy: Secondary | ICD-10-CM | POA: Diagnosis present

## 2011-10-07 DIAGNOSIS — E876 Hypokalemia: Secondary | ICD-10-CM | POA: Diagnosis present

## 2011-10-07 DIAGNOSIS — M5137 Other intervertebral disc degeneration, lumbosacral region: Secondary | ICD-10-CM | POA: Diagnosis present

## 2011-10-07 DIAGNOSIS — M4800 Spinal stenosis, site unspecified: Secondary | ICD-10-CM

## 2011-10-07 DIAGNOSIS — Z885 Allergy status to narcotic agent status: Secondary | ICD-10-CM

## 2011-10-07 DIAGNOSIS — D649 Anemia, unspecified: Secondary | ICD-10-CM | POA: Diagnosis present

## 2011-10-07 DIAGNOSIS — F3289 Other specified depressive episodes: Secondary | ICD-10-CM | POA: Diagnosis present

## 2011-10-07 DIAGNOSIS — Z9660 Presence of unspecified orthopedic joint implant: Secondary | ICD-10-CM

## 2011-10-07 DIAGNOSIS — R410 Disorientation, unspecified: Secondary | ICD-10-CM

## 2011-10-07 DIAGNOSIS — F329 Major depressive disorder, single episode, unspecified: Secondary | ICD-10-CM | POA: Diagnosis present

## 2011-10-07 DIAGNOSIS — N390 Urinary tract infection, site not specified: Principal | ICD-10-CM | POA: Diagnosis present

## 2011-10-07 DIAGNOSIS — K219 Gastro-esophageal reflux disease without esophagitis: Secondary | ICD-10-CM | POA: Diagnosis present

## 2011-10-07 LAB — COMPREHENSIVE METABOLIC PANEL
AST: 14 U/L (ref 0–37)
Albumin: 3 g/dL — ABNORMAL LOW (ref 3.5–5.2)
Alkaline Phosphatase: 148 U/L — ABNORMAL HIGH (ref 39–117)
Chloride: 103 mEq/L (ref 96–112)
Potassium: 3.3 mEq/L — ABNORMAL LOW (ref 3.5–5.1)
Total Bilirubin: 0.4 mg/dL (ref 0.3–1.2)
Total Protein: 7.1 g/dL (ref 6.0–8.3)

## 2011-10-07 LAB — URINALYSIS, ROUTINE W REFLEX MICROSCOPIC
Bilirubin Urine: NEGATIVE
Hgb urine dipstick: NEGATIVE
Ketones, ur: NEGATIVE mg/dL
Nitrite: POSITIVE — AB
Specific Gravity, Urine: >1.03 — ABNORMAL HIGH (ref 1.005–1.030)
pH: 5 (ref 5.0–8.0)

## 2011-10-07 LAB — CBC WITH DIFFERENTIAL/PLATELET
Basophils Absolute: 0 10*3/uL (ref 0.0–0.1)
Basophils Relative: 0 % (ref 0–1)
Eosinophils Absolute: 0.3 10*3/uL (ref 0.0–0.7)
MCH: 27.8 pg (ref 26.0–34.0)
MCHC: 31 g/dL (ref 30.0–36.0)
Neutro Abs: 4.5 10*3/uL (ref 1.7–7.7)
Neutrophils Relative %: 65 % (ref 43–77)
Platelets: 295 10*3/uL (ref 150–400)
RDW: 14.2 % (ref 11.5–15.5)

## 2011-10-07 MED ORDER — ATORVASTATIN CALCIUM 10 MG PO TABS
10.0000 mg | ORAL_TABLET | Freq: Every day | ORAL | Status: DC
  Administered 2011-10-07 – 2011-10-10 (×4): 10 mg via ORAL
  Filled 2011-10-07 (×4): qty 1

## 2011-10-07 MED ORDER — CYCLOBENZAPRINE HCL 10 MG PO TABS
10.0000 mg | ORAL_TABLET | Freq: Three times a day (TID) | ORAL | Status: DC
  Administered 2011-10-07 – 2011-10-11 (×11): 10 mg via ORAL
  Filled 2011-10-07 (×11): qty 1

## 2011-10-07 MED ORDER — GLYBURIDE 5 MG PO TABS
5.0000 mg | ORAL_TABLET | Freq: Every day | ORAL | Status: DC
  Administered 2011-10-08 – 2011-10-11 (×4): 5 mg via ORAL
  Filled 2011-10-07 (×4): qty 1

## 2011-10-07 MED ORDER — SIMVASTATIN 10 MG PO TABS: 5.0000 mg | ORAL_TABLET | Freq: Every day | ORAL | Status: DC

## 2011-10-07 MED ORDER — HYDROMORPHONE HCL 4 MG PO TABS
4.0000 mg | ORAL_TABLET | ORAL | Status: DC | PRN
  Administered 2011-10-08 – 2011-10-10 (×5): 4 mg via ORAL
  Filled 2011-10-07 (×5): qty 1

## 2011-10-07 MED ORDER — SODIUM CHLORIDE 0.9 % IJ SOLN
INTRAMUSCULAR | Status: AC
  Filled 2011-10-07: qty 3

## 2011-10-07 MED ORDER — RISAQUAD PO CAPS
1.0000 | ORAL_CAPSULE | Freq: Every day | ORAL | Status: DC
  Administered 2011-10-08 – 2011-10-11 (×4): 1 via ORAL
  Filled 2011-10-07 (×4): qty 1

## 2011-10-07 MED ORDER — DEXTROSE 5 % IV SOLN
1.0000 g | INTRAVENOUS | Status: DC
  Administered 2011-10-08 – 2011-10-10 (×3): 1 g via INTRAVENOUS
  Filled 2011-10-07 (×4): qty 10

## 2011-10-07 MED ORDER — ACIDOPHILUS PO CAPS: 1.0000 | ORAL_CAPSULE | Freq: Every day | ORAL | Status: DC

## 2011-10-07 MED ORDER — SODIUM CHLORIDE 0.9 % IV SOLN
INTRAVENOUS | Status: DC
  Administered 2011-10-07 – 2011-10-08 (×2): via INTRAVENOUS

## 2011-10-07 MED ORDER — ENOXAPARIN SODIUM 40 MG/0.4ML ~~LOC~~ SOLN
40.0000 mg | SUBCUTANEOUS | Status: DC
  Administered 2011-10-07 – 2011-10-10 (×4): 40 mg via SUBCUTANEOUS
  Filled 2011-10-07 (×4): qty 0.4

## 2011-10-07 MED ORDER — DEXTROSE 5 % IV SOLN
1.0000 g | Freq: Once | INTRAVENOUS | Status: AC
  Administered 2011-10-07: 1 g via INTRAVENOUS
  Filled 2011-10-07: qty 10

## 2011-10-07 MED ORDER — HYDROMORPHONE HCL PF 1 MG/ML IJ SOLN
1.0000 mg | Freq: Once | INTRAMUSCULAR | Status: AC
  Administered 2011-10-07: 1 mg via INTRAVENOUS
  Filled 2011-10-07: qty 1

## 2011-10-07 MED ORDER — SODIUM CHLORIDE 0.9 % IV BOLUS (SEPSIS)
500.0000 mL | Freq: Once | INTRAVENOUS | Status: AC
  Administered 2011-10-07: 500 mL via INTRAVENOUS

## 2011-10-07 MED ORDER — VERAPAMIL HCL ER 240 MG PO TBCR
240.0000 mg | EXTENDED_RELEASE_TABLET | Freq: Every day | ORAL | Status: DC
  Administered 2011-10-08 – 2011-10-11 (×4): 240 mg via ORAL
  Filled 2011-10-07 (×4): qty 1

## 2011-10-07 MED ORDER — GABAPENTIN 400 MG PO CAPS
1200.0000 mg | ORAL_CAPSULE | Freq: Every day | ORAL | Status: DC
  Administered 2011-10-07 – 2011-10-10 (×4): 1200 mg via ORAL
  Filled 2011-10-07 (×4): qty 3

## 2011-10-07 NOTE — ED Notes (Signed)
S/p back surgery  Recently, patient has fever onset today and is slightly confused

## 2011-10-07 NOTE — ED Provider Notes (Addendum)
History     CSN: [id number]  Arrival date & time 10/07/11  1641   First MD Initiated Contact with Patient 10/07/11 1800      Chief Complaint  Patient presents with  . Fever    (Consider location/radiation/quality/duration/timing/severity/associated sxs/prior treatment) HPI Comments: Lori Mahoney is a 66 y.o. Female presents to the ED, for evaluation of fever. She's recovering from back surgery, and having in-home OT, and PT. She has had a sore throat. Family has also noticed, confusion. The patient denies problems. She states, that she's been taking her medicines regularly. There are no modifying factors.  The history is provided by the patient and the spouse.    Past Medical History  Diagnosis Date  . Cancer   . Diabetes mellitus   . Hypertension   . Neuropathy   . GERD (gastroesophageal reflux disease)   . Complication of anesthesia   . PONV (postoperative nausea and vomiting)   . H/O exercise stress test     good result- 2003.  Also had Echocardiogram 10 yrs. ago, told that valve was thickening, but no need for changes , no need for f/u  . Shortness of breath   . H/O hiatal hernia   . Neuromuscular disorder     neuropathy- legs & feet   . Arthritis     hands, hips, back   . Anemia     borderline anemia     Past Surgical History  Procedure Date  . Knee surgery   . Abdominal hysterectomy   . Back surgery 02/2009    L3-S1 fusion  . Colonoscopy   . Upper gastrointestinal endoscopy   . Joint replacement     left  . Eye surgery     cataracts removed- R eye    . 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 Arthritis    . Cancer    . Colon cancer Mother   . Breast cancer Sister   . Diabetes Brother     History  Substance Use Topics  . Smoking status: Never Smoker   . Smokeless tobacco: Never Used  . Alcohol Use: No    OB History    Grav Para Term Preterm Abortions TAB SAB Ect Mult Living                    Review of Systems  All other systems reviewed and are negative.    Allergies  Codeine; Hydrocodone-acetaminophen; Meperidine hcl; Contrast media; and Oxycodone  Home Medications   Current Outpatient Rx  Name Route Sig Dispense Refill  . CYCLOBENZAPRINE HCL 10 MG PO TABS Oral Take 10 mg by mouth 3 (three) times daily.    Marland Kitchen GABAPENTIN 300 MG PO CAPS Oral Take 1,200 mg by mouth at bedtime.     . GLYBURIDE 5 MG PO TABS Oral Take 5-10 mg by mouth. Patient is taking 2 in the morning and 1 at night    . HYDROMORPHONE HCL 4 MG PO TABS Oral Take 4 mg by mouth every 4 (four) hours as needed. pain    . ACIDOPHILUS PO Oral Take 1 tablet by mouth daily as needed. For constipation    . PRAVASTATIN SODIUM 40 MG PO TABS Oral Take 40 mg by mouth at bedtime.     Marland Kitchen VERAPAMIL HCL ER 240 MG PO TBCR Oral Take 240 mg by mouth daily with breakfast.  BP 125/66  Pulse 80  Temp 98.8 F (37.1 C) (Rectal)  Resp 20  SpO2 96%  Physical Exam  Nursing note and vitals reviewed. Constitutional: She appears well-developed and well-nourished.  HENT:  Head: Normocephalic and atraumatic.  Eyes: Conjunctivae normal and EOM are normal. Pupils are equal, round, and reactive to light.  Neck: Normal range of motion and phonation normal. Neck supple.  Cardiovascular: Normal rate, regular rhythm and intact distal pulses.   Pulmonary/Chest: Effort normal and breath sounds normal. She exhibits no tenderness.  Abdominal: Soft. She exhibits no distension. There is no tenderness. There is no guarding.  Musculoskeletal: Normal range of motion.       Healing lumbar wound, postoperative, without evidence for infection.  Neurological: She is alert. She has normal strength. She exhibits normal muscle tone.       She is a mildly poor historian  Skin: Skin is warm and dry.  Psychiatric: She has a normal mood and affect. Her behavior is normal. Judgment and thought content normal.    ED Course   Procedures (including critical care time)   Emergency department treatment: IV fluids, IV, Rocephin, IV, Dilaudid. The case was discussed with the doctor on-call for her primary care doctor, who agreed to admit.  Holding orders were written.     Labs Reviewed  CBC WITH DIFFERENTIAL - Abnormal; Notable for the following:    RBC 3.53 (*)     Hemoglobin 9.8 (*)     HCT 31.6 (*)     All other components within normal limits  COMPREHENSIVE METABOLIC PANEL - Abnormal; Notable for the following:    Potassium 3.3 (*)     Glucose, Bld 112 (*)     Albumin 3.0 (*)     Alkaline Phosphatase 148 (*)     GFR calc non Af Amer 66 (*)     GFR calc Af Amer 77 (*)     All other components within normal limits  URINALYSIS, ROUTINE W REFLEX MICROSCOPIC - Abnormal; Notable for the following:    Color, Urine BROWN (*)  BIOCHEMICALS MAY BE AFFECTED BY COLOR   APPearance HAZY (*)     Specific Gravity, Urine >1.030 (*)     Protein, ur TRACE (*)     Nitrite POSITIVE (*)     All other components within normal limits  SEDIMENTATION RATE - Abnormal; Notable for the following:    Sed Rate 70 (*)     All other components within normal limits  URINE MICROSCOPIC-ADD ON - Abnormal; Notable for the following:    Squamous Epithelial / LPF FEW (*)     Bacteria, UA MANY (*)     All other components within normal limits  CULTURE, BLOOD (ROUTINE X 2)  CULTURE, BLOOD (ROUTINE X 2)  POCT LACTIC ACID (LACTATE)  URINE CULTURE   Dg Chest Port 1 View  (if Code Sepsis Called)  10/07/2011  *RADIOLOGY REPORT*  Clinical Data: Fever after recent back surgery  PORTABLE CHEST - 1 VIEW  Comparison: 09/09/2011  Findings: Low lung volumes on this portable semi-erect chest. Heart size is   slightly increased due to a poor inspiratory effort.  There is mild vascular congestion but no definite infiltrates or  failure.  Degenerative changes present in the spine.  Worsening aeration compared with most recent previous.  IMPRESSION:  Low lung volumes. Mild vascular congestion.  No definite active infiltrates.   Original Report Authenticated By: Elsie Stain, M.D.      1.  UTI (lower urinary tract infection)   2. Dehydration   3. Confusion       MDM  Urinary tract infection, with dehydration, and nonspecific confusion. The latter is expected to be multi-factorial. Doubt discitis, or hardware infection. Patient will be monitoring and inpatient setting for stabilization and treatment, as indicated.       Flint Melter, MD 10/07/11 1610  Flint Melter, MD 11/12/11 [phone]

## 2011-10-07 NOTE — H&P (Signed)
Lori Mahoney MRN: [id number] DOB/AGE: 67-Jun-1947 67 y.o. Primary Care Physician:HAWKINS,EDWARD L, MD Admit date: 10/07/2011 Chief Complaint:  Change in Mental status and fever HPI: This is a 66 years old female patient with history of multiple medical illness who had decompression and fusion of L2/3 came to ER. Since her surgery patient was at home and taking her medications. Recently  She was getting confused however on day of admission she started spiking fever and she become more confused, During the evaluation in Er she was found to have abnormal urinalysis.She was started on IV antibiotics and admitted as case of UTI. No cough,chest pain, nausea,vomiting or abdominal pain.  Past Medical History  Diagnosis Date  . Cancer   . Diabetes mellitus   . Hypertension   . Neuropathy   . GERD (gastroesophageal reflux disease)   . Complication of anesthesia   . PONV (postoperative nausea and vomiting)   . H/O exercise stress test     good result- 2003.  Also had Echocardiogram 10 yrs. ago, told that valve was thickening, but no need for changes , no need for f/u  . Shortness of breath   . H/O hiatal hernia   . Neuromuscular disorder     neuropathy- legs & feet   . Arthritis     hands, hips, back   . Anemia     borderline anemia    Past Surgical History  Procedure Date  . Knee surgery   . Abdominal hysterectomy   . Back surgery 02/2009    L3-S1 fusion  . Colonoscopy   . Upper gastrointestinal endoscopy   . Joint replacement     left  . Eye surgery     cataracts removed- R eye    . Stapedectomy     bilateral, then a 2nd time to R ear        Family History  Problem Relation Age of Onset  . Arthritis    . Cancer    . Colon cancer Mother   . Breast cancer Sister   . Diabetes Brother     Social History:  reports that she has never smoked. She has never used smokeless tobacco. She reports that she does not drink alcohol or use illicit drugs.   Allergies:  Allergies    Allergen Reactions  . Codeine Nausea And Vomiting  . Hydrocodone-Acetaminophen Nausea And Vomiting  . Meperidine Hcl Nausea And Vomiting  . Contrast Media (Iodinated Diagnostic Agents) Nausea And Vomiting  . Oxycodone Nausea And Vomiting    Medications Prior to Admission  Medication Sig Dispense Refill  . cyclobenzaprine (FLEXERIL) 10 MG tablet Take 10 mg by mouth 3 (three) times daily.      Marland Kitchen gabapentin (NEURONTIN) 300 MG capsule Take 1,200 mg by mouth at bedtime.       Marland Kitchen glyBURIDE (DIABETA) 5 MG tablet Take 5-10 mg by mouth. Patient is taking 2 in the morning and 1 at night      . HYDROmorphone (DILAUDID) 4 MG tablet Take 4 mg by mouth every 4 (four) hours as needed. pain      . Lactobacillus (ACIDOPHILUS PO) Take 1 tablet by mouth daily as needed. For constipation      . pravastatin (PRAVACHOL) 40 MG tablet Take 40 mg by mouth at bedtime.       . verapamil (CALAN-SR) 240 MG CR tablet Take 240 mg by mouth daily with breakfast.            WUJ:WJXBJ from the  symptoms mentioned above,there are no other symptoms referable to all systems reviewed.  Physical Exam: Blood pressure 125/66, pulse 80, temperature 98.8 F (37.1 C), temperature source Rectal, resp. rate 20, SpO2 96.00%. General condition- chronically sick looking, confused but awake Respiratory- decreased air entry, bilateral rhonchi CVS- S1 & S2 heard no murmur or gallop ABD- soft and lax, bowel sound + EXT- No leg edema    Basename 10/07/11 1700  WBC 7.0  NEUTROABS 4.5  HGB 9.8*  HCT 31.6*  MCV 89.5  PLT 295    Basename 10/07/11 1700  NA 137  K 3.3*  CL 103  CO2 26  GLUCOSE 112*  BUN 12  CREATININE 0.88  CALCIUM 8.9  MG --  lablast2(ast:2,ALT:2,alkphos:2,bilitot:2,prot:2,albumin:2)@    No results found for this or any previous visit (from the past 240 hour(s)).   Dg Chest 2 View  09/09/2011  *RADIOLOGY REPORT*  Clinical Data: Preop for lumbar fusion  CHEST - 2 VIEW  Comparison: Chest radiograph  01/22/2010  Findings: Normal heart, mediastinal, and hilar contours.  Normal pulmonary vascularity.  The lungs are normally expanded and clear. Negative for pleural effusion.  Posterior fusion of one level of the lumbar spine is visualized on the lateral view.  There are degenerative changes of the thoracic spine.  IMPRESSION: No acute cardiopulmonary disease.  Original Report Authenticated By: Britta Mccreedy, M.D.   Dg Lumbar Spine Complete  09/14/2011  *RADIOLOGY REPORT*  Clinical Data: Back pain  DG C-ARM 1-60 MIN,LUMBAR SPINE - COMPLETE 4+ VIEW  Comparison: None.  Findings: C-arm films document revision of the previous L3-S1 fusion upward to involve the L2-L3 level using pedicle screw fixation.  IMPRESSION: As above.   Original Report Authenticated By: Elsie Stain, M.D.    Ct Lumbar Spine Wo Contrast  09/19/2011  *RADIOLOGY REPORT*  Clinical Data: Worsened right lower extremity pain.  Status post extension of prior lumbar fusion to include L2-3 on 09/14/2011.  CT LUMBAR SPINE WITHOUT CONTRAST  Technique:  Multidetector CT imaging of the lumbar spine was performed without intravenous contrast administration. Multiplanar CT image reconstructions were also generated.  Comparison: Post-myelogram CT scan 08/23/2011 and intraoperative fluoroscopic spot views lumbar spine 09/14/2011.  Findings: Numbering scheme is the same as that employed on the prior study with rudimentary disc material at the S1-2 level.  As seen on intraoperative views, previous L3-S1 fusion has been extended to include the L2-3 level.  Trace retrolisthesis of L2 on L3 is unchanged.  The pedicle screws in L2 just traverse the lateral recesses.  The L3 and L4 screws appear adequately positioned and unremarkable.  There is some new mild lucency about the left L5 screw.  The right L5 screw just traverses the lateral recess, unchanged.  The right S1 screw traverses the lateral recess, unchanged.  Left S1 screw appears normal.  The right  stabilization bar has been cut at the L4 level.  The left stabilization bars extend across the L2-3 level and then from L4- S1.  L2-3:  There is a fluid collection in the posterior soft tissues at the laminectomy site.  Multiple locules of gas are identified in the laminectomy bed and likely be due to the presence of Gelfoam. The central canal appears decompressed and the foramina appear open.  L3-4: Calcified central disc protrusion is unchanged.  Foramina appear open.  Wide laminectomy defect noted.  L4-5:  Solid bridging bone is present across the L4-5 level with a wide laminectomy defect identified. The appearance is unchanged.  L5-S1:  Laminectomy defect identified.  The appearance is unchanged.  S1-2:  Unchanged.  IMPRESSION:  1.  Interval extension of L3-S1 fusion to include the L2-3 level. The L2 screws just traverse the far periphery of the lateral recesses. 2.  Fluid collection posterior to the surgical site with locules of gas in the laminectomy bed presumably represent postoperative change but cannot be definitively characterized.  If there is concern for infection, MRI with and without contrast is recommend for further evaluation. 3.  Apparent lucency about the left L5 screw may be related to differences in scanning technique. Loosening/infection are less likely given the absence of surgical intervention at this level.   Original Report Authenticated By: Bernadene Bell. D'ALESSIO, M.D.    Dg Chest Port 1 View  (if Code Sepsis Called)  10/07/2011  *RADIOLOGY REPORT*  Clinical Data: Fever after recent back surgery  PORTABLE CHEST - 1 VIEW  Comparison: 09/09/2011  Findings: Low lung volumes on this portable semi-erect chest. Heart size is   slightly increased due to a poor inspiratory effort.  There is mild vascular congestion but no definite infiltrates or  failure.  Degenerative changes present in the spine.  Worsening aeration compared with most recent previous.  IMPRESSION: Low lung volumes. Mild vascular  congestion.  No definite active infiltrates.   Original Report Authenticated By: Elsie Stain, M.D.    Dg C-arm 1-60 Min  09/14/2011  *RADIOLOGY REPORT*  Clinical Data: Back pain  DG C-ARM 1-60 MIN,LUMBAR SPINE - COMPLETE 4+ VIEW  Comparison: None.  Findings: C-arm films document revision of the previous L3-S1 fusion upward to involve the L2-L3 level using pedicle screw fixation.  IMPRESSION: As above.   Original Report Authenticated By: Elsie Stain, M.D.    Impression: 1. UTI 2. Metabolic encaphalopathy 3 Diaibetes Mellitus type II 4. Hypertension 5. S/P l2/3 decomprssion and fusion Active Problems:  * No active hospital problems. *      Plan: We will continue IV rocephin We will gradually rehydrate DVT prophylaxis Continue regular treatment.      Brewster Wolters Pager [phone]  10/07/2011, 9:30 PM

## 2011-10-07 NOTE — ED Notes (Signed)
Pt c/o low-grade ever and confusion x1 day. Pt has back sx 3 weeks ago has been doing OT and PT from home. Pt's family states that pt c/o sore throat last night. This morning family noticed a low-grade fever and states the pt began acting "out of her head". Pt did appear to be confused during assessment. Pt denies pain anywhere other than back.

## 2011-10-08 MED ORDER — FLEET ENEMA 7-19 GM/118ML RE ENEM
1.0000 | ENEMA | Freq: Once | RECTAL | Status: AC
  Administered 2011-10-08: 1 via RECTAL

## 2011-10-08 MED ORDER — SODIUM CHLORIDE 0.9 % IJ SOLN
INTRAMUSCULAR | Status: AC
  Administered 2011-10-08: 10 mL
  Filled 2011-10-08: qty 3

## 2011-10-08 NOTE — Progress Notes (Addendum)
10/08/11 1600 Patient c/o constipation, requested fleets enema. Order received per Dr Ouida Sills, fleets enema given this afternoon. Patient tolerated well.  10/08/11 1814 Patient had large stool after fleets enema this afternoon, stated felt much better. Riccardo Dubin

## 2011-10-08 NOTE — Progress Notes (Signed)
UR Chart Review Completed  

## 2011-10-08 NOTE — Evaluation (Signed)
Physical Therapy Evaluation Patient Details Name: Lori Mahoney MRN: [id number] DOB: [birth year] Today's Date: 10/08/2011 Time: 4098-1191 PT Time Calculation (min): 30 min  PT Assessment / Plan / Recommendation Clinical Impression  Pt s/p PLIF in August who has came to [facility] with urinary trac infection who's safety and mobility has decreased.  Pt will benefit from skilled PT to increase her functional mobility and  safety.    PT Assessment  Patient needs continued PT services    Follow Up Recommendations  Skilled nursing facility    Barriers to Discharge  none      Equipment Recommendations  None recommended by PT    Recommendations for Other Services   OT  Frequency Min 6X/week    Precautions / Restrictions Precautions Precautions: Back Spinal Brace:  (Pt states corset is at home requested that she has family br)         Mobility  Bed Mobility Bed Mobility: Supine to Sit Rolling Right: 4: Min assist Right Sidelying to Sit: 3: Mod assist Supine to Sit: 3: Mod assist Sitting - Scoot to Edge of Bed: 4: Min assist Sit to Supine: 4: Min guard Transfers Transfers: Sit to Stand Sit to Stand: 3: Mod assist (took two attempts) Stand to Sit: 5: Supervision Ambulation/Gait Ambulation/Gait Assistance: Not tested (comment) (Pt refused)    Exercises Other Exercises Other Exercises: ab/glut sets; bridge z 5   PT Diagnosis: Difficulty walking;Generalized weakness;Acute pain  PT Problem List: Decreased strength;Decreased activity tolerance;Decreased mobility;Pain PT Treatment Interventions: Gait training;Therapeutic exercise;Patient/family education   PT Goals Acute Rehab PT Goals PT Goal Formulation: With patient Time For Goal Achievement: 10/11/11 Potential to Achieve Goals: Good Pt will Roll Supine to Right Side: with modified independence PT Goal: Rolling Supine to Right Side - Progress: Goal set today Pt will Roll Supine to Left Side: with modified independence PT Goal:  Rolling Supine to Left Side - Progress: Goal set today Pt will go Supine/Side to Sit: with supervision PT Goal: Supine/Side to Sit - Progress: Goal set today Pt will go Sit to Supine/Side: with supervision PT Goal: Sit to Supine/Side - Progress: Goal set today Pt will go Sit to Stand: with supervision PT Goal: Sit to Stand - Progress: Goal set today Pt will go Stand to Sit: with modified independence PT Goal: Stand to Sit - Progress: Goal set today Pt will Transfer Bed to Chair/Chair to Bed: with min assist PT Transfer Goal: Bed to Chair/Chair to Bed - Progress: Goal set today Pt will Ambulate: 1 - 15 feet;with min assist;with mod assist;with rolling walker PT Goal: Ambulate - Progress: Goal set today  Visit Information  Last PT Received On: 10/08/11    Subjective Data  Subjective: The patient states she fell twice at home.  States that she can not even stand now.   Prior Functioning  Home Living Lives With: Spouse Available Help at Discharge: Family Home Access: Stairs to enter Secretary/administrator of Steps: 4 Entrance Stairs-Rails: Right;Left;Can reach both Home Layout: One level Bathroom Shower/Tub: Health visitor: Standard How Accessible: Accessible via wheelchair Home Adaptive Equipment: Dan Humphreys - four wheeled;Bedside commode/3-in-1;Wheelchair - manual;Quad cane Prior Function Level of Independence: Needs assistance Needs Assistance: Meal Prep;Light Housekeeping Bath: Minimal    Cognition    WNL          End of Session PT - End of Session Equipment Utilized During Treatment: Gait belt Activity Tolerance: Patient limited by pain Patient left: in bed;with call bell/phone within reach  GP  RUSSELL,CINDY 10/08/2011, 11:49 AM

## 2011-10-09 ENCOUNTER — Encounter (HOSPITAL_COMMUNITY): Payer: Self-pay | Admitting: *Deleted

## 2011-10-09 DIAGNOSIS — G9341 Metabolic encephalopathy: Secondary | ICD-10-CM | POA: Diagnosis present

## 2011-10-09 DIAGNOSIS — N390 Urinary tract infection, site not specified: Secondary | ICD-10-CM | POA: Diagnosis present

## 2011-10-09 LAB — URINE CULTURE: Colony Count: >=100000

## 2011-10-09 LAB — CULTURE, BLOOD (ROUTINE X 2)

## 2011-10-09 LAB — GLUCOSE, CAPILLARY

## 2011-10-09 MED ORDER — POTASSIUM CHLORIDE CRYS ER 20 MEQ PO TBCR
20.0000 meq | EXTENDED_RELEASE_TABLET | Freq: Two times a day (BID) | ORAL | Status: DC
  Administered 2011-10-09 (×2): 20 meq via ORAL
  Filled 2011-10-09 (×2): qty 1

## 2011-10-09 NOTE — Progress Notes (Signed)
Clinical Social Work Department CLINICAL SOCIAL WORK PLACEMENT NOTE 10/09/2011  Patient:  Lori Mahoney, Lori Mahoney  Account Number:  [id number] Admit date:  10/07/2011  Clinical Social Worker:  Skip Mayer  Date/time:  10/09/2011 10:30 AM  Clinical Social Work is seeking post-discharge placement for this patient at the following level of care:   SKILLED NURSING   (*CSW will update this form in Epic as items are completed)     Patient/family provided with [facility] Department of Clinical Social Work's list of facilities offering this level of care within the geographic area requested by the patient (or if unable, by the patient's family).    Patient/family informed of their freedom to choose among providers that offer the needed level of care, that participate in Medicare, Medicaid or managed care program needed by the patient, have an available bed and are willing to accept the patient.    Patient/family informed of [facility]' ownership interest in [facility], as well as of the fact that they are under no obligation to receive care at this facility.  PASARR submitted to [employer] on 10/09/2011 (JB) PASARR number received from [employer] on 10/09/2011 (JB)  FL2 transmitted to all facilities in geographic area requested by pt/family on  10/09/2011 (JB) FL2 transmitted to all facilities within larger geographic area on   Patient informed that his/her managed care company has contracts with or will negotiate with  certain facilities, including the following:     Patient/family informed of bed offers received:   Patient chooses bed at  Physician recommends and patient chooses bed at    Patient to be transferred to  on   Patient to be transferred to facility by   The following physician request were entered in Epic:   Additional Comments:

## 2011-10-09 NOTE — Progress Notes (Signed)
Physical Therapy Treatment Patient Details Name: Lori Mahoney MRN: [id number] DOB: Nov 19, 1945 Today's Date: 10/09/2011 Time: 6295-2841 PT Time Calculation (min): 30 min  PT Assessment / Plan / Recommendation Comments on Treatment Session  Pt very motivated to improve.  Pt I with bed mobility but did require vc-ing for hand placement to assist with sit<>stand for safety.  Pt able to ambulate 86' RW with min assistance vc-ing to increase L LE step length, posture and to step within walker.     Follow Up Recommendations       Barriers to Discharge        Equipment Recommendations       Recommendations for Other Services    Frequency     Plan      Precautions / Restrictions Precautions Precautions: Back;Fall Restrictions Weight Bearing Restrictions: No       Mobility  Bed Mobility Rolling Right: 7: Independent Right Sidelying to Sit: 7: Independent Supine to Sit: 3: Mod assist Sitting - Scoot to Edge of Bed: 4: Min assist Sit to Supine: 4: Min guard Sit to Sidelying Right: 6: Modified independent (Device/Increase time) (vc-ing for hand placement with sit to stand for safety) Details for Bed Mobility Assistance: I with bed mobility.  Did require vc-ing for hand placement for safety Transfers Transfers: Sit to Stand;Stand to Sit Sit to Stand: 3: Mod assist Stand to Sit: 4: Min guard (vc-ing for hand placement) Ambulation/Gait Ambulation/Gait Assistance: 4: Min assist Ambulation Distance (Feet): 36 Feet Assistive device: Rolling walker Ambulation/Gait Assistance Details: Cueing for posture, to increase L LE step length, step within RW Gait Pattern: Step-through pattern;Decreased step length - left;Trunk flexed Gait velocity: slow and labored Stairs: No Wheelchair Mobility Wheelchair Mobility: No    Exercises Total Joint Exercises Bridges: AROM;Strengthening;Both;5 reps   PT Diagnosis:    PT Problem List:   PT Treatment Interventions:     PT Goals Acute Rehab PT  Goals PT Goal: Rolling Supine to Right Side - Progress: Met PT Goal: Supine/Side to Sit - Progress: Met PT Goal: Sit to Supine/Side - Progress: Met PT Goal: Sit to Stand - Progress: Progressing toward goal PT Goal: Stand to Sit - Progress: Progressing toward goal PT Goal: Ambulate - Progress: Met  Visit Information  Last PT Received On: 10/09/11    Subjective Data  Subjective: Pt stated back and R hip pain scale 3-4/10.  Pt determined to improve to make it home.   Cognition  Overall Cognitive Status: Appears within functional limits for tasks assessed/performed Arousal/Alertness: Awake/alert Orientation Level: Appears intact for tasks assessed Behavior During Session: [facility] for tasks performed    Balance     End of Session PT - End of Session Equipment Utilized During Treatment: Gait belt Activity Tolerance: Patient tolerated treatment well;Patient limited by fatigue;Patient limited by pain Patient left: in bed;with call bell/phone within reach;with bed alarm set Nurse Communication: Mobility status   GP     Lori Mahoney 10/09/2011, 8:56 AM

## 2011-10-09 NOTE — Progress Notes (Signed)
Subjective: She is much better. She has no new complaints. She is less confused.  Objective: Vital signs in last 24 hours: Temp:  [98.2 F (36.8 C)-99 F (37.2 C)] 98.2 F (36.8 C) (09/14 0641) Pulse Rate:  [69-95] 69  (09/14 0641) Resp:  [18] 18  (09/14 0641) BP: (130-146)/(78-83) 136/79 mmHg (09/14 0641) SpO2:  [92 %-96 %] 94 % (09/14 0641) Weight change:  Last BM Date: 10/08/11  Intake/Output from previous day: 09/13 0701 - 09/14 0700 In: 1360 [P.O.:720; I.V.:640] Out: 300 [Urine:300]  PHYSICAL EXAM General appearance: alert, cooperative, mild distress and Mildly confused Resp: clear to auscultation bilaterally Cardio: regular rate and rhythm, S1, S2 normal, no murmur, click, rub or gallop GI: soft, non-tender; bowel sounds normal; no masses,  no organomegaly Extremities: extremities normal, atraumatic, no cyanosis or edema  Lab Results:    Basic Metabolic Panel:  Basename 10/07/11 1700  NA 137  K 3.3*  CL 103  CO2 26  GLUCOSE 112*  BUN 12  CREATININE 0.88  CALCIUM 8.9  MG --  PHOS --   Liver Function Tests:  Basename 10/07/11 1700  AST 14  ALT 7  ALKPHOS 148*  BILITOT 0.4  PROT 7.1  ALBUMIN 3.0*   No results found for this basename: LIPASE:2,AMYLASE:2 in the last 72 hours No results found for this basename: AMMONIA:2 in the last 72 hours CBC:  Basename 10/07/11 1700  WBC 7.0  NEUTROABS 4.5  HGB 9.8*  HCT 31.6*  MCV 89.5  PLT 295   Cardiac Enzymes: No results found for this basename: CKTOTAL:3,CKMB:3,CKMBINDEX:3,TROPONINI:3 in the last 72 hours BNP: No results found for this basename: PROBNP:3 in the last 72 hours D-Dimer: No results found for this basename: DDIMER:2 in the last 72 hours CBG:  Basename 10/09/11 0747 10/07/11 2238  GLUCAP 101* 100*   Hemoglobin A1C: No results found for this basename: HGBA1C in the last 72 hours Fasting Lipid Panel: No results found for this basename: CHOL,HDL,LDLCALC,TRIG,CHOLHDL,LDLDIRECT in the  last 72 hours Thyroid Function Tests: No results found for this basename: TSH,T4TOTAL,FREET4,T3FREE,THYROIDAB in the last 72 hours Anemia Panel: No results found for this basename: VITAMINB12,FOLATE,FERRITIN,TIBC,IRON,RETICCTPCT in the last 72 hours Coagulation: No results found for this basename: LABPROT:2,INR:2 in the last 72 hours Urine Drug Screen: Drugs of Abuse  No results found for this basename: labopia, cocainscrnur, labbenz, amphetmu, thcu, labbarb    Alcohol Level: No results found for this basename: ETH:2 in the last 72 hours Urinalysis:  Basename 10/07/11 1810  COLORURINE BROWN*  LABSPEC >1.030*  PHURINE 5.0  GLUCOSEU NEGATIVE  HGBUR NEGATIVE  BILIRUBINUR NEGATIVE  KETONESUR NEGATIVE  PROTEINUR TRACE*  UROBILINOGEN 0.2  NITRITE POSITIVE*  LEUKOCYTESUR NEGATIVE   Misc. Labs:  ABGS No results found for this basename: PHART,PCO2,PO2ART,TCO2,HCO3 in the last 72 hours CULTURES Recent Results (from the past 240 hour(s))  CULTURE, BLOOD (ROUTINE X 2)     Status: Normal (Preliminary result)   Collection Time   10/07/11  5:00 PM      Component Value Range Status Comment   Specimen Description BLOOD LEFT ANTECUBITAL DRAWN BY RN   Final    Special Requests BOTTLES DRAWN AEROBIC AND ANAEROBIC 8CC   Final    Culture  Setup Time 10/09/2011 01:37   Final    Culture     Final    Value: GRAM POSITIVE COCCI IN CLUSTERS     Note: Gram Stain Report Called to,Read Back By and Verified With: ALSTON C AT 1506 10/08/11 BY PRUITT  C Performed at [facility]   Report Status PENDING   Incomplete   CULTURE, BLOOD (ROUTINE X 2)     Status: Normal (Preliminary result)   Collection Time   10/07/11  5:50 PM      Component Value Range Status Comment   Specimen Description BLOOD RIGHT ANTECUBITAL   Final    Special Requests BOTTLES DRAWN AEROBIC AND ANAEROBIC 15CC   Final    Culture NO GROWTH 1 DAY   Final    Report Status PENDING   Incomplete   URINE CULTURE     Status: Normal  (Preliminary result)   Collection Time   10/07/11  6:10 PM      Component Value Range Status Comment   Specimen Description URINE, CATHETERIZED   Final    Special Requests NONE   Final    Culture  Setup Time 10/08/2011 03:02   Final    Colony Count >=100,000 COLONIES/ML   Final    Culture ESCHERICHIA COLI   Final    Report Status PENDING   Incomplete    Studies/Results: Dg Chest Port 1 View  (if Code Sepsis Called)  10/07/2011  *RADIOLOGY REPORT*  Clinical Data: Fever after recent back surgery  PORTABLE CHEST - 1 VIEW  Comparison: 09/09/2011  Findings: Low lung volumes on this portable semi-erect chest. Heart size is   slightly increased due to a poor inspiratory effort.  There is mild vascular congestion but no definite infiltrates or  failure.  Degenerative changes present in the spine.  Worsening aeration compared with most recent previous.  IMPRESSION: Low lung volumes. Mild vascular congestion.  No definite active infiltrates.   Original Report Authenticated By: Elsie Stain, M.D.     Medications:  Prior to Admission:  Prescriptions prior to admission  Medication Sig Dispense Refill  . cyclobenzaprine (FLEXERIL) 10 MG tablet Take 10 mg by mouth 3 (three) times daily.      Marland Kitchen gabapentin (NEURONTIN) 300 MG capsule Take 1,200 mg by mouth at bedtime.       Marland Kitchen glyBURIDE (DIABETA) 5 MG tablet Take 5-10 mg by mouth. Patient is taking 2 in the morning and 1 at night      . HYDROmorphone (DILAUDID) 4 MG tablet Take 4 mg by mouth every 4 (four) hours as needed. pain      . Lactobacillus (ACIDOPHILUS PO) Take 1 tablet by mouth daily as needed. For constipation      . pravastatin (PRAVACHOL) 40 MG tablet Take 40 mg by mouth at bedtime.       . verapamil (CALAN-SR) 240 MG CR tablet Take 240 mg by mouth daily with breakfast.        Scheduled:   . acidophilus  1 capsule Oral Daily  . atorvastatin  10 mg Oral QHS  . cefTRIAXone (ROCEPHIN)  IV  1 g Intravenous Q24H  . cyclobenzaprine  10 mg Oral  TID  . enoxaparin (LOVENOX) injection  40 mg Subcutaneous Q24H  . gabapentin  1,200 mg Oral QHS  . glyBURIDE  5 mg Oral Q breakfast  . sodium chloride      . sodium chloride      . sodium phosphate  1 enema Rectal Once  . verapamil  240 mg Oral Q breakfast   Continuous:   . sodium chloride 75 mL/hr at 10/08/11 1452   WUJ:[id number]  Assesment: She has urinary tract infection with confusion. She is not doing well with her postop situation. She is going to  need skilled care facility placement for rehabilitation Active Problems:  * No active hospital problems. *     Plan: Continue IV antibiotics continue all the other treatments    LOS: 2 days   Mithcell Schumpert L 10/09/2011, 9:44 AM

## 2011-10-09 NOTE — Progress Notes (Signed)
Subjective: She was admitted with a urinary tract infection confusion and significant difficulty with ambulation. She had surgery on her back.  Objective: Vital signs in last 24 hours: Temp:  [98.2 F (36.8 C)-99 F (37.2 C)] 98.2 F (36.8 C) (09/14 0641) Pulse Rate:  [69-95] 69  (09/14 0641) Resp:  [18] 18  (09/14 0641) BP: (130-146)/(78-83) 136/79 mmHg (09/14 0641) SpO2:  [92 %-96 %] 94 % (09/14 0641) Weight change:  Last BM Date: 10/08/11  Intake/Output from previous day: 09/13 0701 - 09/14 0700 In: 1360 [P.O.:720; I.V.:640] Out: 300 [Urine:300]  PHYSICAL EXAM General appearance: alert, cooperative, mild distress and Confused Resp: clear to auscultation bilaterally Cardio: regular rate and rhythm, S1, S2 normal, no murmur, click, rub or gallop GI: soft, non-tender; bowel sounds normal; no masses,  no organomegaly Extremities: extremities normal, atraumatic, no cyanosis or edema  Lab Results:    Basic Metabolic Panel:  Basename 10/07/11 1700  NA 137  K 3.3*  CL 103  CO2 26  GLUCOSE 112*  BUN 12  CREATININE 0.88  CALCIUM 8.9  MG --  PHOS --   Liver Function Tests:  Basename 10/07/11 1700  AST 14  ALT 7  ALKPHOS 148*  BILITOT 0.4  PROT 7.1  ALBUMIN 3.0*   No results found for this basename: LIPASE:2,AMYLASE:2 in the last 72 hours No results found for this basename: AMMONIA:2 in the last 72 hours CBC:  Basename 10/07/11 1700  WBC 7.0  NEUTROABS 4.5  HGB 9.8*  HCT 31.6*  MCV 89.5  PLT 295   Cardiac Enzymes: No results found for this basename: CKTOTAL:3,CKMB:3,CKMBINDEX:3,TROPONINI:3 in the last 72 hours BNP: No results found for this basename: PROBNP:3 in the last 72 hours D-Dimer: No results found for this basename: DDIMER:2 in the last 72 hours CBG:  Basename 10/09/11 0747 10/07/11 2238  GLUCAP 101* 100*   Hemoglobin A1C: No results found for this basename: HGBA1C in the last 72 hours Fasting Lipid Panel: No results found for this  basename: CHOL,HDL,LDLCALC,TRIG,CHOLHDL,LDLDIRECT in the last 72 hours Thyroid Function Tests: No results found for this basename: TSH,T4TOTAL,FREET4,T3FREE,THYROIDAB in the last 72 hours Anemia Panel: No results found for this basename: VITAMINB12,FOLATE,FERRITIN,TIBC,IRON,RETICCTPCT in the last 72 hours Coagulation: No results found for this basename: LABPROT:2,INR:2 in the last 72 hours Urine Drug Screen: Drugs of Abuse  No results found for this basename: labopia, cocainscrnur, labbenz, amphetmu, thcu, labbarb    Alcohol Level: No results found for this basename: ETH:2 in the last 72 hours Urinalysis:  Basename 10/07/11 1810  COLORURINE BROWN*  LABSPEC >1.030*  PHURINE 5.0  GLUCOSEU NEGATIVE  HGBUR NEGATIVE  BILIRUBINUR NEGATIVE  KETONESUR NEGATIVE  PROTEINUR TRACE*  UROBILINOGEN 0.2  NITRITE POSITIVE*  LEUKOCYTESUR NEGATIVE   Misc. Labs:  ABGS No results found for this basename: PHART,PCO2,PO2ART,TCO2,HCO3 in the last 72 hours CULTURES Recent Results (from the past 240 hour(s))  CULTURE, BLOOD (ROUTINE X 2)     Status: Normal (Preliminary result)   Collection Time   10/07/11  5:00 PM      Component Value Range Status Comment   Specimen Description BLOOD LEFT ANTECUBITAL DRAWN BY RN   Final    Special Requests BOTTLES DRAWN AEROBIC AND ANAEROBIC 8CC   Final    Culture  Setup Time 10/09/2011 01:37   Final    Culture     Final    Value: GRAM POSITIVE COCCI IN CLUSTERS     Note: Gram Stain Report Called to,Read Back By and Verified With: ALSTON  C AT 1506 10/08/11 BY PRUITT C Performed at [facility]   Report Status PENDING   Incomplete   CULTURE, BLOOD (ROUTINE X 2)     Status: Normal (Preliminary result)   Collection Time   10/07/11  5:50 PM      Component Value Range Status Comment   Specimen Description BLOOD RIGHT ANTECUBITAL   Final    Special Requests BOTTLES DRAWN AEROBIC AND ANAEROBIC 15CC   Final    Culture NO GROWTH 1 DAY   Final    Report Status  PENDING   Incomplete   URINE CULTURE     Status: Normal (Preliminary result)   Collection Time   10/07/11  6:10 PM      Component Value Range Status Comment   Specimen Description URINE, CATHETERIZED   Final    Special Requests NONE   Final    Culture  Setup Time 10/08/2011 03:02   Final    Colony Count >=100,000 COLONIES/ML   Final    Culture ESCHERICHIA COLI   Final    Report Status PENDING   Incomplete    Studies/Results: Dg Chest Port 1 View  (if Code Sepsis Called)  10/07/2011  *RADIOLOGY REPORT*  Clinical Data: Fever after recent back surgery  PORTABLE CHEST - 1 VIEW  Comparison: 09/09/2011  Findings: Low lung volumes on this portable semi-erect chest. Heart size is   slightly increased due to a poor inspiratory effort.  There is mild vascular congestion but no definite infiltrates or  failure.  Degenerative changes present in the spine.  Worsening aeration compared with most recent previous.  IMPRESSION: Low lung volumes. Mild vascular congestion.  No definite active infiltrates.   Original Report Authenticated By: Elsie Stain, M.D.     Medications:  Prior to Admission:  Prescriptions prior to admission  Medication Sig Dispense Refill  . cyclobenzaprine (FLEXERIL) 10 MG tablet Take 10 mg by mouth 3 (three) times daily.      Marland Kitchen gabapentin (NEURONTIN) 300 MG capsule Take 1,200 mg by mouth at bedtime.       Marland Kitchen glyBURIDE (DIABETA) 5 MG tablet Take 5-10 mg by mouth. Patient is taking 2 in the morning and 1 at night      . HYDROmorphone (DILAUDID) 4 MG tablet Take 4 mg by mouth every 4 (four) hours as needed. pain      . Lactobacillus (ACIDOPHILUS PO) Take 1 tablet by mouth daily as needed. For constipation      . pravastatin (PRAVACHOL) 40 MG tablet Take 40 mg by mouth at bedtime.       . verapamil (CALAN-SR) 240 MG CR tablet Take 240 mg by mouth daily with breakfast.        Scheduled:   . acidophilus  1 capsule Oral Daily  . atorvastatin  10 mg Oral QHS  . cefTRIAXone (ROCEPHIN)   IV  1 g Intravenous Q24H  . cyclobenzaprine  10 mg Oral TID  . enoxaparin (LOVENOX) injection  40 mg Subcutaneous Q24H  . gabapentin  1,200 mg Oral QHS  . glyBURIDE  5 mg Oral Q breakfast  . sodium chloride      . sodium chloride      . sodium phosphate  1 enema Rectal Once  . verapamil  240 mg Oral Q breakfast   Continuous:   . sodium chloride 75 mL/hr at 10/08/11 1452   ZOX:[id number]  Assesment: She has a UTI. She is confused. She's had previous surgery. She is very  weak. She has several other medical problems are Active Problems:  * No active hospital problems. *     Plan: She will receive IV fluids IV antibiotics. I'm going to ask for consultation from physical therapy but I think she's going to have to do a rehabilitation stay    LOS: 2 days   Pati Thinnes L 10/09/2011, 9:41 AM

## 2011-10-09 NOTE — Progress Notes (Signed)
10/09/11 1547 Patient assisted up to chair this afternoon, tolerated well but still requires two person assist. Stated comfortable sitting up, pillows for support, chair alarm in place for safety. Call light within reach, instructed patient to call for assistance, verbalized understanding. Riccardo Dubin

## 2011-10-10 LAB — BASIC METABOLIC PANEL
BUN: 7 mg/dL (ref 6–23)
CO2: 26 mEq/L (ref 19–32)
Chloride: 107 mEq/L (ref 96–112)
GFR calc Af Amer: 86 mL/min — ABNORMAL LOW (ref >90)
Potassium: 3.2 mEq/L — ABNORMAL LOW (ref 3.5–5.1)

## 2011-10-10 LAB — CBC WITH DIFFERENTIAL/PLATELET
HCT: 28.3 % — ABNORMAL LOW (ref 36.0–46.0)
Hemoglobin: 8.7 g/dL — ABNORMAL LOW (ref 12.0–15.0)
Lymphocytes Relative: 42 % (ref 12–46)
Monocytes Absolute: 0.5 10*3/uL (ref 0.1–1.0)
Monocytes Relative: 8 % (ref 3–12)
Neutro Abs: 2.6 10*3/uL (ref 1.7–7.7)
WBC: 6 10*3/uL (ref 4.0–10.5)

## 2011-10-10 MED ORDER — DEXTROSE 5 % IV SOLN
INTRAVENOUS | Status: AC
  Filled 2011-10-10: qty 10

## 2011-10-10 MED ORDER — POTASSIUM CHLORIDE CRYS ER 20 MEQ PO TBCR
40.0000 meq | EXTENDED_RELEASE_TABLET | Freq: Two times a day (BID) | ORAL | Status: DC
  Administered 2011-10-10 – 2011-10-11 (×3): 40 meq via ORAL
  Filled 2011-10-10: qty 2
  Filled 2011-10-10 (×2): qty 1
  Filled 2011-10-10: qty 2

## 2011-10-10 NOTE — Progress Notes (Signed)
Subjective: She was admitted with urinary tract infection and confusion. She has become markedly deconditioned after having had surgery on her back. She says she feels somewhat better.  Objective: Vital signs in last 24 hours: Temp:  [97.7 F (36.5 C)-98.4 F (36.9 C)] 97.7 F (36.5 C) (09/15 0559) Pulse Rate:  [72-77] 76  (09/15 0559) Resp:  [18] 18  (09/15 0559) BP: (128-138)/(66-84) 138/84 mmHg (09/15 0559) SpO2:  [95 %-96 %] 95 % (09/15 0559) Weight change:  Last BM Date: 10/09/11  Intake/Output from previous day: 09/14 0701 - 09/15 0700 In: 2558.8 [P.O.:840; I.V.:1668.8; IV Piggyback:50] Out: -   PHYSICAL EXAM General appearance: alert, cooperative and no distress Resp: clear to auscultation bilaterally Cardio: regular rate and rhythm, S1, S2 normal, no murmur, click, rub or gallop GI: soft, non-tender; bowel sounds normal; no masses,  no organomegaly Extremities: extremities normal, atraumatic, no cyanosis or edema  Lab Results:    Basic Metabolic Panel:  Basename 10/10/11 0650 10/07/11 1700  NA 139 137  K 3.2* 3.3*  CL 107 103  CO2 26 26  GLUCOSE 158* 112*  BUN 7 12  CREATININE 0.80 0.88  CALCIUM 8.4 8.9  MG -- --  PHOS -- --   Liver Function Tests:  Basename 10/07/11 1700  AST 14  ALT 7  ALKPHOS 148*  BILITOT 0.4  PROT 7.1  ALBUMIN 3.0*   No results found for this basename: LIPASE:2,AMYLASE:2 in the last 72 hours No results found for this basename: AMMONIA:2 in the last 72 hours CBC:  Basename 10/10/11 0650 10/07/11 1700  WBC 6.0 7.0  NEUTROABS 2.6 4.5  HGB 8.7* 9.8*  HCT 28.3* 31.6*  MCV 88.2 89.5  PLT 273 295   Cardiac Enzymes: No results found for this basename: CKTOTAL:3,CKMB:3,CKMBINDEX:3,TROPONINI:3 in the last 72 hours BNP: No results found for this basename: PROBNP:3 in the last 72 hours D-Dimer: No results found for this basename: DDIMER:2 in the last 72 hours CBG:  Basename 10/09/11 0747 10/07/11 2238  GLUCAP 101* 100*    Hemoglobin A1C: No results found for this basename: HGBA1C in the last 72 hours Fasting Lipid Panel: No results found for this basename: CHOL,HDL,LDLCALC,TRIG,CHOLHDL,LDLDIRECT in the last 72 hours Thyroid Function Tests: No results found for this basename: TSH,T4TOTAL,FREET4,T3FREE,THYROIDAB in the last 72 hours Anemia Panel: No results found for this basename: VITAMINB12,FOLATE,FERRITIN,TIBC,IRON,RETICCTPCT in the last 72 hours Coagulation: No results found for this basename: LABPROT:2,INR:2 in the last 72 hours Urine Drug Screen: Drugs of Abuse  No results found for this basename: labopia, cocainscrnur, labbenz, amphetmu, thcu, labbarb    Alcohol Level: No results found for this basename: ETH:2 in the last 72 hours Urinalysis:  Basename 10/07/11 1810  COLORURINE BROWN*  LABSPEC >1.030*  PHURINE 5.0  GLUCOSEU NEGATIVE  HGBUR NEGATIVE  BILIRUBINUR NEGATIVE  KETONESUR NEGATIVE  PROTEINUR TRACE*  UROBILINOGEN 0.2  NITRITE POSITIVE*  LEUKOCYTESUR NEGATIVE   Misc. Labs:  ABGS No results found for this basename: PHART,PCO2,PO2ART,TCO2,HCO3 in the last 72 hours CULTURES Recent Results (from the past 240 hour(s))  CULTURE, BLOOD (ROUTINE X 2)     Status: Normal   Collection Time   10/07/11  5:00 PM      Component Value Range Status Comment   Specimen Description BLOOD LEFT ANTECUBITAL DRAWN BY RN   Final    Special Requests BOTTLES DRAWN AEROBIC AND ANAEROBIC 8CC   Final    Culture  Setup Time 10/09/2011 01:37   Final    Culture  Final    Value: STAPHYLOCOCCUS SPECIES (COAGULASE NEGATIVE)     Note: THE SIGNIFICANCE OF ISOLATING THIS ORGANISM FROM A SINGLE VENIPUNCTURE CANNOT BE PREDICTED WITHOUT FURTHER CLINICAL AND CULTURE CORRELATION. SUSCEPTIBILITIES AVAILABLE ONLY ON REQUEST.     Note: Gram Stain Report Called to,Read Back By and Verified With: ALSTON C AT 1506 10/08/11 BY PRUITT C Performed at [facility]   Report Status 10/09/2011 FINAL   Final   CULTURE,  BLOOD (ROUTINE X 2)     Status: Normal (Preliminary result)   Collection Time   10/07/11  5:50 PM      Component Value Range Status Comment   Specimen Description BLOOD RIGHT ANTECUBITAL   Final    Special Requests BOTTLES DRAWN AEROBIC AND ANAEROBIC 15CC   Final    Culture NO GROWTH 3 DAYS   Final    Report Status PENDING   Incomplete   URINE CULTURE     Status: Normal   Collection Time   10/07/11  6:10 PM      Component Value Range Status Comment   Specimen Description URINE, CATHETERIZED   Final    Special Requests NONE   Final    Culture  Setup Time 10/08/2011 03:02   Final    Colony Count >=100,000 COLONIES/ML   Final    Culture ESCHERICHIA COLI   Final    Report Status 10/09/2011 FINAL   Final    Organism ID, Bacteria ESCHERICHIA COLI   Final    Studies/Results: No results found.  Medications:  Prior to Admission:  Prescriptions prior to admission  Medication Sig Dispense Refill  . cyclobenzaprine (FLEXERIL) 10 MG tablet Take 10 mg by mouth 3 (three) times daily.      Marland Kitchen gabapentin (NEURONTIN) 300 MG capsule Take 1,200 mg by mouth at bedtime.       Marland Kitchen glyBURIDE (DIABETA) 5 MG tablet Take 5-10 mg by mouth. Patient is taking 2 in the morning and 1 at night      . HYDROmorphone (DILAUDID) 4 MG tablet Take 4 mg by mouth every 4 (four) hours as needed. pain      . Lactobacillus (ACIDOPHILUS PO) Take 1 tablet by mouth daily as needed. For constipation      . pravastatin (PRAVACHOL) 40 MG tablet Take 40 mg by mouth at bedtime.       . verapamil (CALAN-SR) 240 MG CR tablet Take 240 mg by mouth daily with breakfast.        Scheduled:   . acidophilus  1 capsule Oral Daily  . atorvastatin  10 mg Oral QHS  . cefTRIAXone (ROCEPHIN)  IV  1 g Intravenous Q24H  . cyclobenzaprine  10 mg Oral TID  . enoxaparin (LOVENOX) injection  40 mg Subcutaneous Q24H  . gabapentin  1,200 mg Oral QHS  . glyBURIDE  5 mg Oral Q breakfast  . potassium chloride  40 mEq Oral BID  . verapamil  240 mg Oral Q  breakfast  . DISCONTD: potassium chloride  20 mEq Oral BID   Continuous:   . sodium chloride 75 mL/hr at 10/08/11 1452   NWG:[id number]  Assesment: She has a urinary tract infection and this is Escherichia coli but we don't have sensitivities yet. She is improving so I think current antibiotic is probably appropriate. She is more hypokalemic today. Her other problems are stable. Her encephalopathy is improved Principal Problem:  *UTI (lower urinary tract infection) Active Problems:  DIABETES  SPINAL STENOSIS  HIGH BLOOD PRESSURE  DDD (degenerative disc disease), lumbar  Metabolic encephalopathy    Plan: She will have potassium replacement continue with all of the other treatments    LOS: 3 days   Lori Mahoney L 10/10/2011, 8:10 AM

## 2011-10-11 ENCOUNTER — Emergency Department (HOSPITAL_COMMUNITY)
Admission: EM | Admit: 2011-10-11 | Discharge: 2011-10-12 | Disposition: A | Discharge status: Skilled Nursing Facility | Payer: Medicare Other | Attending: Emergency Medicine | Admitting: Emergency Medicine

## 2011-10-11 ENCOUNTER — Encounter (HOSPITAL_COMMUNITY): Payer: Self-pay | Admitting: Emergency Medicine

## 2011-10-11 DIAGNOSIS — Z79899 Other long term (current) drug therapy: Secondary | ICD-10-CM | POA: Insufficient documentation

## 2011-10-11 DIAGNOSIS — E876 Hypokalemia: Secondary | ICD-10-CM | POA: Insufficient documentation

## 2011-10-11 DIAGNOSIS — I10 Essential (primary) hypertension: Secondary | ICD-10-CM | POA: Insufficient documentation

## 2011-10-11 DIAGNOSIS — R112 Nausea with vomiting, unspecified: Secondary | ICD-10-CM | POA: Insufficient documentation

## 2011-10-11 DIAGNOSIS — E860 Dehydration: Secondary | ICD-10-CM | POA: Insufficient documentation

## 2011-10-11 DIAGNOSIS — E119 Type 2 diabetes mellitus without complications: Secondary | ICD-10-CM | POA: Insufficient documentation

## 2011-10-11 DIAGNOSIS — R197 Diarrhea, unspecified: Secondary | ICD-10-CM | POA: Insufficient documentation

## 2011-10-11 LAB — CBC
HCT: 27.9 % — ABNORMAL LOW (ref 36.0–46.0)
Hemoglobin: 8.7 g/dL — ABNORMAL LOW (ref 12.0–15.0)
MCH: 27.2 pg (ref 26.0–34.0)
MCHC: 31.2 g/dL (ref 30.0–36.0)
MCV: 87.2 fL (ref 78.0–100.0)

## 2011-10-11 MED ORDER — ONDANSETRON HCL 4 MG/2ML IJ SOLN
4.0000 mg | Freq: Once | INTRAMUSCULAR | Status: AC
  Administered 2011-10-11: 4 mg via INTRAVENOUS
  Filled 2011-10-11: qty 2

## 2011-10-11 MED ORDER — SERTRALINE HCL 50 MG PO TABS
50.0000 mg | ORAL_TABLET | Freq: Every day | ORAL | Status: DC
  Administered 2011-10-11: 50 mg via ORAL
  Filled 2011-10-11: qty 1

## 2011-10-11 MED ORDER — SODIUM CHLORIDE 0.9 % IV BOLUS (SEPSIS)
1000.0000 mL | Freq: Once | INTRAVENOUS | Status: AC
  Administered 2011-10-12: 1000 mL via INTRAVENOUS

## 2011-10-11 MED ORDER — CIPROFLOXACIN HCL 250 MG PO TABS: 250.0000 mg | ORAL_TABLET | Freq: Two times a day (BID) | ORAL | Status: AC

## 2011-10-11 MED ORDER — SERTRALINE HCL 50 MG PO TABS: 50.0000 mg | ORAL_TABLET | Freq: Every day | ORAL | Status: DC

## 2011-10-11 NOTE — Progress Notes (Signed)
UR Chart Review Completed  

## 2011-10-11 NOTE — Clinical Social Work Placement (Signed)
Clinical Social Work Department CLINICAL SOCIAL WORK PLACEMENT NOTE 10/11/2011  Patient:  MELEK, POWNALL  Account Number:  [id number] Admit date:  10/07/2011  Clinical Social Worker:  Skip Mayer  Date/time:  10/09/2011 10:30 AM  Clinical Social Work is seeking post-discharge placement for this patient at the following level of care:   SKILLED NURSING   (*CSW will update this form in Epic as items are completed)   10/11/2011  Patient/family provided with [facility] Department of Clinical Social Work's list of facilities offering this level of care within the geographic area requested by the patient (or if unable, by the patient's family).  10/11/2011  Patient/family informed of their freedom to choose among providers that offer the needed level of care, that participate in Medicare, Medicaid or managed care program needed by the patient, have an available bed and are willing to accept the patient.  10/11/2011  Patient/family informed of [facility]' ownership interest in [facility], as well as of the fact that they are under no obligation to receive care at this facility.  PASARR submitted to EDS on 10/09/2011 PASARR number received from EDS on 10/09/2011  FL2 transmitted to all facilities in geographic area requested by pt/family on  10/09/2011 FL2 transmitted to all facilities within larger geographic area on   Patient informed that his/her managed care company has contracts with or will negotiate with  certain facilities, including the following:     Patient/family informed of bed offers received:  10/11/2011 Patient chooses bed at [facility] Physician recommends and patient chooses bed at  [facility]  Patient to be transferred to [facility] on  10/11/2011 Patient to be transferred to facility by RN  The following physician request were entered in Epic:   Additional Comments:  Derenda Fennel, LCSW [phone]

## 2011-10-11 NOTE — Progress Notes (Deleted)
Report called to K. Roselyn Bering LPN at the [facility], pt. Taken to the [facility] via W/C.

## 2011-10-11 NOTE — Progress Notes (Signed)
Subjective: She looks better. Her family says they think she is depressed because she's had several crying episodes. She has Escherichia coli in the urine which is sensitive to most antibiotics. She also has one of 2 positive blood cultures for Staphylococcus which is coagulase negative.  Objective: Vital signs in last 24 hours: Temp:  [98.1 F (36.7 C)-98.8 F (37.1 C)] 98.1 F (36.7 C) (09/16 0436) Pulse Rate:  [70-81] 78  (09/16 0436) Resp:  [18] 18  (09/16 0436) BP: (117-169)/(68-85) 136/85 mmHg (09/16 0436) SpO2:  [95 %-98 %] 97 % (09/16 0436) Weight change:  Last BM Date: 10/09/11  Intake/Output from previous day: 09/15 0701 - 09/16 0700 In: 2092.5 [P.O.:720; I.V.:1322.5; IV Piggyback:50] Out: -   PHYSICAL EXAM General appearance: alert, cooperative and no distress Resp: clear to auscultation bilaterally Cardio: regular rate and rhythm, S1, S2 normal, no murmur, click, rub or gallop GI: soft, non-tender; bowel sounds normal; no masses,  no organomegaly Extremities: extremities normal, atraumatic, no cyanosis or edema  Lab Results:    Basic Metabolic Panel:  Basename 10/10/11 0650  NA 139  K 3.2*  CL 107  CO2 26  GLUCOSE 158*  BUN 7  CREATININE 0.80  CALCIUM 8.4  MG --  PHOS --   Liver Function Tests: No results found for this basename: AST:2,ALT:2,ALKPHOS:2,BILITOT:2,PROT:2,ALBUMIN:2 in the last 72 hours No results found for this basename: LIPASE:2,AMYLASE:2 in the last 72 hours No results found for this basename: AMMONIA:2 in the last 72 hours CBC:  Basename 10/11/11 0524 10/10/11 0650  WBC 6.8 6.0  NEUTROABS -- 2.6  HGB 8.7* 8.7*  HCT 27.9* 28.3*  MCV 87.2 88.2  PLT 280 273   Cardiac Enzymes: No results found for this basename: CKTOTAL:3,CKMB:3,CKMBINDEX:3,TROPONINI:3 in the last 72 hours BNP: No results found for this basename: PROBNP:3 in the last 72 hours D-Dimer: No results found for this basename: DDIMER:2 in the last 72  hours CBG:  Basename 10/09/11 0747  GLUCAP 101*   Hemoglobin A1C: No results found for this basename: HGBA1C in the last 72 hours Fasting Lipid Panel: No results found for this basename: CHOL,HDL,LDLCALC,TRIG,CHOLHDL,LDLDIRECT in the last 72 hours Thyroid Function Tests: No results found for this basename: TSH,T4TOTAL,FREET4,T3FREE,THYROIDAB in the last 72 hours Anemia Panel: No results found for this basename: VITAMINB12,FOLATE,FERRITIN,TIBC,IRON,RETICCTPCT in the last 72 hours Coagulation: No results found for this basename: LABPROT:2,INR:2 in the last 72 hours Urine Drug Screen: Drugs of Abuse  No results found for this basename: labopia, cocainscrnur, labbenz, amphetmu, thcu, labbarb    Alcohol Level: No results found for this basename: ETH:2 in the last 72 hours Urinalysis: No results found for this basename: COLORURINE:2,APPERANCEUR:2,LABSPEC:2,PHURINE:2,GLUCOSEU:2,HGBUR:2,BILIRUBINUR:2,KETONESUR:2,PROTEINUR:2,UROBILINOGEN:2,NITRITE:2,LEUKOCYTESUR:2 in the last 72 hours Misc. Labs:  ABGS No results found for this basename: PHART,PCO2,PO2ART,TCO2,HCO3 in the last 72 hours CULTURES Recent Results (from the past 240 hour(s))  CULTURE, BLOOD (ROUTINE X 2)     Status: Normal   Collection Time   10/07/11  5:00 PM      Component Value Range Status Comment   Specimen Description BLOOD LEFT ANTECUBITAL DRAWN BY RN   Final    Special Requests BOTTLES DRAWN AEROBIC AND ANAEROBIC 8CC   Final    Culture  Setup Time 10/09/2011 01:37   Final    Culture     Final    Value: STAPHYLOCOCCUS SPECIES (COAGULASE NEGATIVE)     Note: THE SIGNIFICANCE OF ISOLATING THIS ORGANISM FROM A SINGLE VENIPUNCTURE CANNOT BE PREDICTED WITHOUT FURTHER CLINICAL AND CULTURE CORRELATION. SUSCEPTIBILITIES AVAILABLE ONLY  ON REQUEST.     Note: Gram Stain Report Called to,Read Back By and Verified With: ALSTON C AT 1506 10/08/11 BY PRUITT C Performed at [facility]   Report Status 10/09/2011 FINAL   Final    CULTURE, BLOOD (ROUTINE X 2)     Status: Normal (Preliminary result)   Collection Time   10/07/11  5:50 PM      Component Value Range Status Comment   Specimen Description BLOOD RIGHT ANTECUBITAL   Final    Special Requests BOTTLES DRAWN AEROBIC AND ANAEROBIC 15CC   Final    Culture NO GROWTH 3 DAYS   Final    Report Status PENDING   Incomplete   URINE CULTURE     Status: Normal   Collection Time   10/07/11  6:10 PM      Component Value Range Status Comment   Specimen Description URINE, CATHETERIZED   Final    Special Requests NONE   Final    Culture  Setup Time 10/08/2011 03:02   Final    Colony Count >=100,000 COLONIES/ML   Final    Culture ESCHERICHIA COLI   Final    Report Status 10/09/2011 FINAL   Final    Organism ID, Bacteria ESCHERICHIA COLI   Final    Studies/Results: No results found.  Medications:  Prior to Admission:  Prescriptions prior to admission  Medication Sig Dispense Refill  . cyclobenzaprine (FLEXERIL) 10 MG tablet Take 10 mg by mouth 3 (three) times daily.      Marland Kitchen gabapentin (NEURONTIN) 300 MG capsule Take 1,200 mg by mouth at bedtime.       Marland Kitchen glyBURIDE (DIABETA) 5 MG tablet Take 5-10 mg by mouth. Patient is taking 2 in the morning and 1 at night      . HYDROmorphone (DILAUDID) 4 MG tablet Take 4 mg by mouth every 4 (four) hours as needed. pain      . Lactobacillus (ACIDOPHILUS PO) Take 1 tablet by mouth daily as needed. For constipation      . pravastatin (PRAVACHOL) 40 MG tablet Take 40 mg by mouth at bedtime.       . verapamil (CALAN-SR) 240 MG CR tablet Take 240 mg by mouth daily with breakfast.        Scheduled:   . acidophilus  1 capsule Oral Daily  . atorvastatin  10 mg Oral QHS  . cefTRIAXone (ROCEPHIN)  IV  1 g Intravenous Q24H  . cyclobenzaprine  10 mg Oral TID  . enoxaparin (LOVENOX) injection  40 mg Subcutaneous Q24H  . gabapentin  1,200 mg Oral QHS  . glyBURIDE  5 mg Oral Q breakfast  . potassium chloride  40 mEq Oral BID  . verapamil   240 mg Oral Q breakfast   Continuous:   . sodium chloride 75 mL/hr at 10/08/11 1452   ZOX:[id number]  Assesment: She has a Escherichia coli urinary tract infection. This caused her to become very confused. She also has one of 2 positive blood cultures for a different organism. Her encephalopathy is better. She is probably depressed. Principal Problem:  *UTI (lower urinary tract infection) Active Problems:  DIABETES  SPINAL STENOSIS  HIGH BLOOD PRESSURE  DDD (degenerative disc disease), lumbar  Metabolic encephalopathy    Plan: There is a heightened interest in positive blood cultures for staphylococcus so I will discuss by phone with infectious disease and see she needs any treatment. Otherwise I will put her on something for depression and depending on what  needs to be done with the staphylococcus she could be ready for transfer to skilled care facility today    LOS: 4 days   Demaurion Dicioccio L 10/11/2011, 8:45 AM

## 2011-10-11 NOTE — ED Provider Notes (Addendum)
History    This chart was scribed for [name], MD, MD by Smitty Pluck. The patient was seen in room APA02 and the patient's care was started at 11:46PM.   CSN: [id number]  Arrival date & time 10/11/11  2209   First MD Initiated Contact with Patient 10/11/11 2343      Chief Complaint  Patient presents with  . Emesis    (Consider location/radiation/quality/duration/timing/severity/associated sxs/prior treatment) Patient is a 66 y.o. female presenting with vomiting. The history is provided by the patient, the spouse and medical records. No language interpreter was used.  Emesis  Associated symptoms include diarrhea. Pertinent negatives include no chills and no fever.   Lori Mahoney is a 66 y.o. female who presents to the Emergency Department from [facility] complaining moderate diarrhea, vomiting 3x and generalized discomfort which she describes as having jerking movements.  Pt was in ED 4 days ago for fever, confusion and diagnosed with UTI. Pt was discharged to [facility] for rehab. She has been taking abx. Pt denies fever.  Pt reports having back surgery in [place] 6 weeks ago and was discharged to home.  Pt denies any other pain and states that her back pain has essentially resolved and when she is laying down has no back pain.  Husband statest that MS is normal at this time.  Past Medical History  Diagnosis Date  . Diabetes mellitus   . Hypertension   . Neuropathy   . GERD (gastroesophageal reflux disease)   . Complication of anesthesia   . PONV (postoperative nausea and vomiting)   . H/O exercise stress test     good result- 2003.  Also had Echocardiogram 10 yrs. ago, told that valve was thickening, but no need for changes , no need for f/u  . Shortness of breath   . H/O hiatal hernia   . Neuromuscular disorder     neuropathy- legs & feet   . Arthritis     hands, hips, back   . Anemia     borderline anemia   . Cancer     Past Surgical History  Procedure  Date  . Knee surgery   . Abdominal hysterectomy   . Back surgery 02/2009    L3-S1 fusion  . Colonoscopy   . Upper gastrointestinal endoscopy   . Joint replacement     left  . Eye surgery     cataracts removed- R eye    . 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 Arthritis    . Cancer    . Colon cancer Mother   . Breast cancer Sister   . Diabetes Brother     History  Substance Use Topics  . Smoking status: Never Smoker   . Smokeless tobacco: Never Used  . Alcohol Use: No    OB History    Grav Para Term Preterm Abortions TAB SAB Ect Mult Living                  Review of Systems  Constitutional: Negative for fever and chills.  Respiratory: Negative for shortness of breath.   Gastrointestinal: Positive for nausea, vomiting and diarrhea.  Neurological: Negative for weakness.  All other systems reviewed and are negative.    Allergies  Codeine; Hydrocodone-acetaminophen; Meperidine hcl; Contrast media; and Oxycodone  Home Medications   Current Outpatient Rx  Name Route Sig Dispense  Refill  . CIPROFLOXACIN HCL 250 MG PO TABS Oral Take 1 tablet (250 mg total) by mouth 2 (two) times daily. 20 tablet 0  . CYCLOBENZAPRINE HCL 10 MG PO TABS Oral Take 10 mg by mouth 3 (three) times daily.    Marland Kitchen DIPHENOXYLATE-ATROPINE 2.5-0.025 MG PO TABS Oral Take 1 tablet by mouth 4 (four) times daily as needed for diarrhea or loose stools. 30 tablet 0  . GABAPENTIN 300 MG PO CAPS Oral Take 1,200 mg by mouth at bedtime.     . GLYBURIDE 5 MG PO TABS Oral Take 5-10 mg by mouth. Patient is taking 2 in the morning and 1 at night    . HYDROMORPHONE HCL 4 MG PO TABS Oral Take 4 mg by mouth every 4 (four) hours as needed. pain    . ACIDOPHILUS PO Oral Take 1 tablet by mouth daily as needed. For constipation    . ONDANSETRON 4 MG PO TBDP Oral Take 1 tablet (4 mg total) by mouth every 8 (eight) hours as needed for nausea. 10 tablet 0    . PRAVASTATIN SODIUM 40 MG PO TABS Oral Take 40 mg by mouth at bedtime.     Marland Kitchen PROMETHAZINE HCL 25 MG PO TABS Oral Take 1 tablet (25 mg total) by mouth every 6 (six) hours as needed for nausea. 12 tablet 0  . SERTRALINE HCL 50 MG PO TABS Oral Take 1 tablet (50 mg total) by mouth daily. 30 tablet 12  . VERAPAMIL HCL ER 240 MG PO TBCR Oral Take 240 mg by mouth daily with breakfast.       BP 100/82  Pulse 95  Temp 98.1 F (36.7 C) (Oral)  Resp 20  Ht 5\' 3"  (1.6 m)  Wt 198 lb (89.812 kg)  BMI 35.07 kg/m2  SpO2 95%  Physical Exam  Nursing note and vitals reviewed. Constitutional: She is oriented to person, place, and time. She appears well-developed and well-nourished. No distress.  HENT:  Head: Normocephalic and atraumatic.  Mouth/Throat: Oropharynx is clear and moist.  Eyes: Conjunctivae normal are normal. Right eye exhibits no discharge. Left eye exhibits no discharge. No scleral icterus.  Neck: Normal range of motion. Neck supple.  Cardiovascular: Normal rate, regular rhythm and normal heart sounds.   Pulmonary/Chest: Effort normal and breath sounds normal. No respiratory distress.  Abdominal: Soft. Bowel sounds are normal. There is no tenderness.       No abdominal tenderness, masses or guarding.  Normal BS  Musculoskeletal: Normal range of motion. She exhibits no edema and no tenderness.  Neurological: She is alert and oriented to person, place, and time.       Normal MS, speech, and moves all extremities without difficulty  Skin: Skin is warm and dry.  Psychiatric: She has a normal mood and affect. Her behavior is normal. Judgment and thought content normal.    ED Course  Procedures (including critical care time) DIAGNOSTIC STUDIES: Oxygen Saturation is 95% on room air, normal by my interpretation.    COORDINATION OF CARE: 11:52 PM Discussed pt ED treatment with pt  11:56 PM Ordered:   Medications  promethazine (PHENERGAN) 25 MG tablet (not administered)  ondansetron  (ZOFRAN ODT) 4 MG disintegrating tablet (not administered)  diphenoxylate-atropine (LOMOTIL) 2.5-0.025 MG per tablet (not administered)  ondansetron (ZOFRAN) injection 4 mg (4 mg Intravenous Given 10/11/11 2315)  sodium chloride 0.9 % bolus 1,000 mL (1000 mL Intravenous Given 10/12/11 0001)  promethazine (PHENERGAN) injection 12.5 mg (12.5 mg Intravenous Given 10/12/11  0148)  HYDROmorphone (DILAUDID) injection 0.5 mg (0.5 mg Intravenous Given 10/12/11 0245)  sodium chloride 0.9 % bolus 500 mL (500 mL Intravenous Given 10/12/11 0309)  0.9 %  sodium chloride infusion (  Intravenous New Bag/Given 10/12/11 0448)  potassium chloride 10 mEq in 100 mL IVPB (10 mEq Intravenous New Bag/Given 10/12/11 0354)       Labs Reviewed  URINALYSIS, ROUTINE W REFLEX MICROSCOPIC - Abnormal; Notable for the following:    pH 8.5 (*)     Ketones, ur 15 (*)     All other components within normal limits  BASIC METABOLIC PANEL - Abnormal; Notable for the following:    Potassium 3.2 (*)     Glucose, Bld 157 (*)     GFR calc non Af Amer 88 (*)     All other components within normal limits  CBC - Abnormal; Notable for the following:    RBC 3.83 (*)     Hemoglobin 10.4 (*)     HCT 32.8 (*)     All other components within normal limits  CLOSTRIDIUM DIFFICILE BY PCR   No results found.   1. Nausea and vomiting   2. Diarrhea   3. Dehydration   4. Hypokalemia       MDM  I have some concern that the pt may have developed C dif after being inpatient and abx, however this test was negative.  The labs show that she is dehydrated with mild ketonuria and she has been given IVF, IV K and has improved after taking phenergan for nausea - no more vomiting.  She has no fever and tachycardia has resolved after fluids - she appears well and is stable for d/c.  UTi has cleared.  ED ECG REPORT  I personally interpreted this EKG   Date: 10/12/2011   Rate: 99  Rhythm: normal sinus rhythm  QRS Axis: normal  Intervals:  normal  ST/T Wave abnormalities: nonspecific ST/T changes  Conduction Disutrbances:right bundle branch block  Narrative Interpretation:   Old EKG Reviewed: compared with 09/09/2011, rate slightly increased but no other significant changes, compared with 01/22/2010, no other significant changes.   Discharge Prescriptions include:  Lomotil Phenergan zofran       I personally performed the services described in this documentation, which was scribed in my presence. The recorded information has been reviewed and considered.      [name], MD 10/12/11 1610  [name], MD 10/12/11 [phone]

## 2011-10-11 NOTE — Care Management Note (Unsigned)
    Page 1 of 1   10/11/2011     10:55:05 AM   CARE MANAGEMENT NOTE 10/11/2011  Patient:  Lori Mahoney, Lori Mahoney   Account Number:  [id number]  Date Initiated:  10/11/2011  Documentation initiated by:  Sharrie Rothman  Subjective/Objective Assessment:   Pt admitted from home with UTI and weakness. Pt lives with husband but is now needing SNF.     Action/Plan:   CSW is aware and will arrange discharge to facility today.   Anticipated DC Date:  10/11/2011   Anticipated DC Plan:  SKILLED NURSING FACILITY  In-house referral  Clinical Social Worker      DC Planning Services  CM consult      Choice offered to / List presented to:             Status of service:  Completed, signed off Medicare Important Message given?  YES (If response is "NO", the following Medicare IM given date fields will be blank) Date Medicare IM given:  10/11/2011 Date Additional Medicare IM given:    Discharge Disposition:  SKILLED NURSING FACILITY  Per UR Regulation:    If discussed at Long Length of Stay Meetings, dates discussed:    Comments:  10/11/11 1054 Arlyss Queen, RN BSN CM

## 2011-10-11 NOTE — ED Notes (Signed)
Pt presents from [facility] with n/v/d that started suddenly at 2100 tonight. Per [facility] staff she was given phenergan suppository and states no relief. Pt has not held down her meds tonight. Pt AAx4 at this time.

## 2011-10-11 NOTE — Discharge Summary (Signed)
Physician Discharge Summary  Patient ID: Cay C [place] MRN: [id number] DOB/AGE: 1945-08-24 66 y.o. Primary Care Physician:Betha Shadix L, MD Admit date: 10/07/2011 Discharge date: 10/11/2011    Discharge Diagnoses:   Principal Problem:  *UTI (lower urinary tract infection) Active Problems:  DIABETES  SPINAL STENOSIS  HIGH BLOOD PRESSURE  DDD (degenerative disc disease), lumbar  Metabolic encephalopathy  depression    Medication List     As of 10/11/2011  9:03 AM    TAKE these medications         ACIDOPHILUS PO   Take 1 tablet by mouth daily as needed. For constipation      ciprofloxacin 250 MG tablet   Commonly known as: CIPRO   Take 1 tablet (250 mg total) by mouth 2 (two) times daily.      cyclobenzaprine 10 MG tablet   Commonly known as: FLEXERIL   Take 10 mg by mouth 3 (three) times daily.      gabapentin 300 MG capsule   Commonly known as: NEURONTIN   Take 1,200 mg by mouth at bedtime.      glyBURIDE 5 MG tablet   Commonly known as: DIABETA   Take 5-10 mg by mouth. Patient is taking 2 in the morning and 1 at night      HYDROmorphone 4 MG tablet   Commonly known as: DILAUDID   Take 4 mg by mouth every 4 (four) hours as needed. pain      pravastatin 40 MG tablet   Commonly known as: PRAVACHOL   Take 40 mg by mouth at bedtime.      sertraline 50 MG tablet   Commonly known as: ZOLOFT   Take 1 tablet (50 mg total) by mouth daily.      verapamil 240 MG CR tablet   Commonly known as: CALAN-SR   Take 240 mg by mouth daily with breakfast.        Discharged Condition: Improved   Consults: None except telephone consult with infectious disease  Significant Diagnostic Studies: Dg Lumbar Spine Complete  09/14/2011  *RADIOLOGY REPORT*  Clinical Data: Back pain  DG C-ARM 1-60 MIN,LUMBAR SPINE - COMPLETE 4+ VIEW  Comparison: None.  Findings: C-arm films document revision of the previous L3-S1 fusion upward to involve the L2-L3 level using pedicle screw  fixation.  IMPRESSION: As above.   Original Report Authenticated By: Elsie Stain, M.D.    Ct Lumbar Spine Wo Contrast  09/19/2011  *RADIOLOGY REPORT*  Clinical Data: Worsened right lower extremity pain.  Status post extension of prior lumbar fusion to include L2-3 on 09/14/2011.  CT LUMBAR SPINE WITHOUT CONTRAST  Technique:  Multidetector CT imaging of the lumbar spine was performed without intravenous contrast administration. Multiplanar CT image reconstructions were also generated.  Comparison: Post-myelogram CT scan 08/23/2011 and intraoperative fluoroscopic spot views lumbar spine 09/14/2011.  Findings: Numbering scheme is the same as that employed on the prior study with rudimentary disc material at the S1-2 level.  As seen on intraoperative views, previous L3-S1 fusion has been extended to include the L2-3 level.  Trace retrolisthesis of L2 on L3 is unchanged.  The pedicle screws in L2 just traverse the lateral recesses.  The L3 and L4 screws appear adequately positioned and unremarkable.  There is some new mild lucency about the left L5 screw.  The right L5 screw just traverses the lateral recess, unchanged.  The right S1 screw traverses the lateral recess, unchanged.  Left S1 screw appears normal.  The right stabilization  bar has been cut at the L4 level.  The left stabilization bars extend across the L2-3 level and then from L4- S1.  L2-3:  There is a fluid collection in the posterior soft tissues at the laminectomy site.  Multiple locules of gas are identified in the laminectomy bed and likely be due to the presence of Gelfoam. The central canal appears decompressed and the foramina appear open.  L3-4: Calcified central disc protrusion is unchanged.  Foramina appear open.  Wide laminectomy defect noted.  L4-5:  Solid bridging bone is present across the L4-5 level with a wide laminectomy defect identified. The appearance is unchanged.  L5-S1:  Laminectomy defect identified.  The appearance is  unchanged.  S1-2:  Unchanged.  IMPRESSION:  1.  Interval extension of L3-S1 fusion to include the L2-3 level. The L2 screws just traverse the far periphery of the lateral recesses. 2.  Fluid collection posterior to the surgical site with locules of gas in the laminectomy bed presumably represent postoperative change but cannot be definitively characterized.  If there is concern for infection, MRI with and without contrast is recommend for further evaluation. 3.  Apparent lucency about the left L5 screw may be related to differences in scanning technique. Loosening/infection are less likely given the absence of surgical intervention at this level.   Original Report Authenticated By: Bernadene Bell. D'ALESSIO, M.D.    Dg Chest Port 1 View  (if Code Sepsis Called)  10/07/2011  *RADIOLOGY REPORT*  Clinical Data: Fever after recent back surgery  PORTABLE CHEST - 1 VIEW  Comparison: 09/09/2011  Findings: Low lung volumes on this portable semi-erect chest. Heart size is   slightly increased due to a poor inspiratory effort.  There is mild vascular congestion but no definite infiltrates or  failure.  Degenerative changes present in the spine.  Worsening aeration compared with most recent previous.  IMPRESSION: Low lung volumes. Mild vascular congestion.  No definite active infiltrates.   Original Report Authenticated By: Elsie Stain, M.D.    Dg C-arm 1-60 Min  09/14/2011  *RADIOLOGY REPORT*  Clinical Data: Back pain  DG C-ARM 1-60 MIN,LUMBAR SPINE - COMPLETE 4+ VIEW  Comparison: None.  Findings: C-arm films document revision of the previous L3-S1 fusion upward to involve the L2-L3 level using pedicle screw fixation.  IMPRESSION: As above.   Original Report Authenticated By: Elsie Stain, M.D.     Lab Results: Basic Metabolic Panel:  Basename 10/10/11 0650  NA 139  K 3.2*  CL 107  CO2 26  GLUCOSE 158*  BUN 7  CREATININE 0.80  CALCIUM 8.4  MG --  PHOS --   Liver Function Tests: No results found for  this basename: AST:2,ALT:2,ALKPHOS:2,BILITOT:2,PROT:2,ALBUMIN:2 in the last 72 hours   CBC:  Basename 10/11/11 0524 10/10/11 0650  WBC 6.8 6.0  NEUTROABS -- 2.6  HGB 8.7* 8.7*  HCT 27.9* 28.3*  MCV 87.2 88.2  PLT 280 273    Recent Results (from the past 240 hour(s))  CULTURE, BLOOD (ROUTINE X 2)     Status: Normal   Collection Time   10/07/11  5:00 PM      Component Value Range Status Comment   Specimen Description BLOOD LEFT ANTECUBITAL DRAWN BY RN   Final    Special Requests BOTTLES DRAWN AEROBIC AND ANAEROBIC 8CC   Final    Culture  Setup Time 10/09/2011 01:37   Final    Culture     Final    Value: STAPHYLOCOCCUS SPECIES (COAGULASE  NEGATIVE)     Note: THE SIGNIFICANCE OF ISOLATING THIS ORGANISM FROM A SINGLE VENIPUNCTURE CANNOT BE PREDICTED WITHOUT FURTHER CLINICAL AND CULTURE CORRELATION. SUSCEPTIBILITIES AVAILABLE ONLY ON REQUEST.     Note: Gram Stain Report Called to,Read Back By and Verified With: ALSTON C AT 1506 10/08/11 BY PRUITT C Performed at [facility]   Report Status 10/09/2011 FINAL   Final   CULTURE, BLOOD (ROUTINE X 2)     Status: Normal (Preliminary result)   Collection Time   10/07/11  5:50 PM      Component Value Range Status Comment   Specimen Description BLOOD RIGHT ANTECUBITAL   Final    Special Requests BOTTLES DRAWN AEROBIC AND ANAEROBIC 15CC   Final    Culture NO GROWTH 3 DAYS   Final    Report Status PENDING   Incomplete   URINE CULTURE     Status: Normal   Collection Time   10/07/11  6:10 PM      Component Value Range Status Comment   Specimen Description URINE, CATHETERIZED   Final    Special Requests NONE   Final    Culture  Setup Time 10/08/2011 03:02   Final    Colony Count >=100,000 COLONIES/ML   Final    Culture ESCHERICHIA COLI   Final    Report Status 10/09/2011 FINAL   Final    Organism ID, Bacteria ESCHERICHIA COLI   Final      Hospital Course: She was admitted with urinary tract infection. She was confused. She has had recent  surgery on her back and has had a very difficult time postop. She has not been able to get up and move around as she should. She has no other new complaints. She was treated with intravenous antibiotics. She had evaluation by the physical therapy team and was felt to need skilled care facility placement. She had one of 2 positive blood cultures for coagulase-negative staph and I discussed this with infectious disease by phone and it was felt that there was no treatment needed Discharge Exam: Blood pressure 136/85, pulse 78, temperature 98.1 F (36.7 C), temperature source Oral, resp. rate 18, height 5\' 3"  (1.6 m), weight 93.849 kg (206 lb 14.4 oz), SpO2 97.00%. She is awake and alert. She is able to get up but still requires assistance. Her confusion is better but not totally resolved. Her family think she's probably depressed Disposition: To skilled care facility      Discharge Orders    Future Orders Please Complete By Expires   Discharge to SNF when bed available           Signed: Tenlee Wollin L Pager [phone]  10/11/2011, 9:03 AM

## 2011-10-11 NOTE — ED Notes (Signed)
Pt having several episodes of emesis, ordered 4mg  zofran per protocol and peripherl IV access established

## 2011-10-11 NOTE — Progress Notes (Signed)
Report called to Torrie Mayers LPN at the [facility]. Pt. Taken to the [facility] via w/c.

## 2011-10-11 NOTE — Clinical Social Work Psychosocial (Signed)
Clinical Social Work Department BRIEF PSYCHOSOCIAL ASSESSMENT 10/11/2011  Patient:  Lori Mahoney, Lori Mahoney     Account Number:  [id number]     Admit date:  10/07/2011  Clinical Social Worker:  Nancie Neas  Date/Time:  10/11/2011 10:59 AM  Referred by:  Physician  Date Referred:  10/11/2011 Referred for  SNF Placement   Other Referral:   Interview type:  Patient Other interview type:   daughter and husband    PSYCHOSOCIAL DATA Living Status:  FAMILY Admitted from facility:   Level of care:   Primary support name:  Lori Mahoney Primary support relationship to patient:  CHILD, ADULT Degree of support available:   very supportive    CURRENT CONCERNS Current Concerns  Post-Acute Placement   Other Concerns:    SOCIAL WORK ASSESSMENT / PLAN CSW met with pt and pt's husband and daughter at bedside. Pt alert and oriented and reports she had surgery 3 weeks ago and has been severely deconditioned since then. Prior to surgery she was very independent. PT recommending SNF. CSW discussed placement process. Family very involved and supportive. Pt lives with husband. Pt faxed out over weekend. Bed offers provided and pt and family choose [facility]. Pt d/c today and will transfer with RN. D/C summary faxed.   Assessment/plan status:  Psychosocial Support/Ongoing Assessment of Needs Other assessment/ plan:   Information/referral to community resources:   SNF list    PATIENT'S/FAMILY'S RESPONSE TO PLAN OF CARE: Pt and family feel ST SNF is necessary prior to returning home.        Derenda Fennel, [place] [phone]

## 2011-10-12 ENCOUNTER — Inpatient Hospital Stay
Admission: RE | Admit: 2011-10-12 | Discharge: 2011-10-19 | Disposition: A | Discharge status: Home / Self Care | Payer: BC Managed Care – PPO | Source: Ambulatory Visit | Attending: Pulmonary Disease | Admitting: Pulmonary Disease

## 2011-10-12 LAB — URINALYSIS, ROUTINE W REFLEX MICROSCOPIC
Glucose, UA: NEGATIVE mg/dL
Hgb urine dipstick: NEGATIVE
Ketones, ur: 15 mg/dL — AB
Protein, ur: NEGATIVE mg/dL

## 2011-10-12 LAB — CBC
MCH: 27.2 pg (ref 26.0–34.0)
MCV: 85.6 fL (ref 78.0–100.0)
Platelets: 360 10*3/uL (ref 150–400)
RBC: 3.83 MIL/uL — ABNORMAL LOW (ref 3.87–5.11)

## 2011-10-12 LAB — GLUCOSE, CAPILLARY: Glucose-Capillary: 166 mg/dL — ABNORMAL HIGH (ref 70–99)

## 2011-10-12 LAB — CLOSTRIDIUM DIFFICILE BY PCR: Toxigenic C. Difficile by PCR: NEGATIVE

## 2011-10-12 LAB — BASIC METABOLIC PANEL
CO2: 26 mEq/L (ref 19–32)
Calcium: 9.4 mg/dL (ref 8.4–10.5)
Glucose, Bld: 157 mg/dL — ABNORMAL HIGH (ref 70–99)
Sodium: 141 mEq/L (ref 135–145)

## 2011-10-12 LAB — CULTURE, BLOOD (ROUTINE X 2): Culture: NO GROWTH

## 2011-10-12 MED ORDER — SODIUM CHLORIDE 0.9 % IV SOLN
Freq: Once | INTRAVENOUS | Status: AC
  Administered 2011-10-12: 05:00:00 via INTRAVENOUS

## 2011-10-12 MED ORDER — PROMETHAZINE HCL 25 MG PO TABS: 25.0000 mg | ORAL_TABLET | Freq: Four times a day (QID) | ORAL | Status: DC | PRN

## 2011-10-12 MED ORDER — ONDANSETRON 4 MG PO TBDP: 4.0000 mg | ORAL_TABLET | Freq: Three times a day (TID) | ORAL | Status: DC | PRN

## 2011-10-12 MED ORDER — HYDROMORPHONE HCL PF 1 MG/ML IJ SOLN
0.5000 mg | Freq: Once | INTRAMUSCULAR | Status: AC
  Administered 2011-10-12: 0.5 mg via INTRAVENOUS
  Filled 2011-10-12: qty 1

## 2011-10-12 MED ORDER — DIPHENOXYLATE-ATROPINE 2.5-0.025 MG PO TABS: 1.0000 | ORAL_TABLET | Freq: Four times a day (QID) | ORAL | Status: DC | PRN

## 2011-10-12 MED ORDER — POTASSIUM CHLORIDE 10 MEQ/100ML IV SOLN
10.0000 meq | Freq: Once | INTRAVENOUS | Status: AC
  Administered 2011-10-12: 10 meq via INTRAVENOUS
  Filled 2011-10-12: qty 100

## 2011-10-12 MED ORDER — PROMETHAZINE HCL 25 MG/ML IJ SOLN
12.5000 mg | Freq: Once | INTRAMUSCULAR | Status: AC
  Administered 2011-10-12: 12.5 mg via INTRAVENOUS
  Filled 2011-10-12: qty 1

## 2011-10-12 MED ORDER — SODIUM CHLORIDE 0.9 % IV BOLUS (SEPSIS)
500.0000 mL | Freq: Once | INTRAVENOUS | Status: AC
  Administered 2011-10-12: 500 mL via INTRAVENOUS

## 2011-10-13 LAB — GLUCOSE, CAPILLARY
Glucose-Capillary: 164 mg/dL — ABNORMAL HIGH (ref 70–99)
Glucose-Capillary: 177 mg/dL — ABNORMAL HIGH (ref 70–99)

## 2011-10-14 LAB — GLUCOSE, CAPILLARY
Glucose-Capillary: 83 mg/dL (ref 70–99)
Glucose-Capillary: 147 mg/dL — ABNORMAL HIGH (ref 70–99)

## 2011-10-15 LAB — GLUCOSE, CAPILLARY: Glucose-Capillary: 76 mg/dL (ref 70–99)

## 2011-10-17 LAB — GLUCOSE, CAPILLARY
Glucose-Capillary: 94 mg/dL (ref 70–99)
Glucose-Capillary: 141 mg/dL — ABNORMAL HIGH (ref 70–99)

## 2011-10-18 LAB — GLUCOSE, CAPILLARY: Glucose-Capillary: 82 mg/dL (ref 70–99)

## 2011-10-20 LAB — GLUCOSE, CAPILLARY: Glucose-Capillary: 92 mg/dL (ref 70–99)

## 2011-12-29 ENCOUNTER — Telehealth: Payer: Self-pay | Admitting: Orthopedic Surgery

## 2011-12-29 NOTE — Telephone Encounter (Signed)
agree

## 2011-12-29 NOTE — Telephone Encounter (Signed)
Lori Mahoney called this morning requesting an appointment for shoulder pain.  I did not have any appointments as quickly as she wanted, so I suggested she either see her PCP or go to the ER.

## 2011-12-29 NOTE — Telephone Encounter (Signed)
Noted  

## 2012-01-12 ENCOUNTER — Other Ambulatory Visit (HOSPITAL_COMMUNITY): Payer: Self-pay | Admitting: Pulmonary Disease

## 2012-01-12 ENCOUNTER — Encounter (HOSPITAL_COMMUNITY): Payer: Self-pay

## 2012-01-12 ENCOUNTER — Ambulatory Visit (HOSPITAL_COMMUNITY)
Admission: RE | Admit: 2012-01-12 | Discharge: 2012-01-12 | Disposition: A | Discharge status: Home / Self Care | Payer: Medicare Other | Source: Ambulatory Visit | Attending: Pulmonary Disease | Admitting: Pulmonary Disease

## 2012-01-12 DIAGNOSIS — R29810 Facial weakness: Secondary | ICD-10-CM

## 2012-01-12 DIAGNOSIS — F29 Unspecified psychosis not due to a substance or known physiological condition: Secondary | ICD-10-CM | POA: Insufficient documentation

## 2012-01-12 DIAGNOSIS — G379 Demyelinating disease of central nervous system, unspecified: Secondary | ICD-10-CM | POA: Insufficient documentation

## 2012-01-12 DIAGNOSIS — R410 Disorientation, unspecified: Secondary | ICD-10-CM

## 2012-01-12 DIAGNOSIS — R42 Dizziness and giddiness: Secondary | ICD-10-CM

## 2012-01-12 DIAGNOSIS — R5381 Other malaise: Secondary | ICD-10-CM | POA: Insufficient documentation

## 2012-01-20 ENCOUNTER — Encounter (INDEPENDENT_AMBULATORY_CARE_PROVIDER_SITE_OTHER): Payer: Self-pay

## 2012-02-01 ENCOUNTER — Encounter: Payer: Self-pay | Admitting: Orthopedic Surgery

## 2012-02-01 ENCOUNTER — Ambulatory Visit (INDEPENDENT_AMBULATORY_CARE_PROVIDER_SITE_OTHER): Payer: Medicare PPO | Admitting: Orthopedic Surgery

## 2012-02-01 ENCOUNTER — Ambulatory Visit (INDEPENDENT_AMBULATORY_CARE_PROVIDER_SITE_OTHER): Payer: Medicare PPO

## 2012-02-01 VITALS — BP 160/90 | Ht 63.5 in | Wt 199.2 lb

## 2012-02-01 DIAGNOSIS — M7551 Bursitis of right shoulder: Secondary | ICD-10-CM

## 2012-02-01 DIAGNOSIS — M719 Bursopathy, unspecified: Secondary | ICD-10-CM

## 2012-02-01 DIAGNOSIS — M25519 Pain in unspecified shoulder: Secondary | ICD-10-CM

## 2012-02-01 NOTE — Patient Instructions (Signed)
Call [facility] for therapy

## 2012-02-01 NOTE — Progress Notes (Signed)
  Subjective:    Patient ID: Lori Mahoney, female    DOB: December 16, 1945, 67 y.o.   MRN: [id number]  Shoulder Pain  The pain is present in the neck, back and right shoulder. This is a new problem. The current episode started more than 1 month ago. There has been no history of extremity trauma. The problem occurs constantly. The problem has been gradually worsening. The quality of the pain is described as dull. The pain is at a severity of 5/10. The pain is moderate. Associated symptoms include a limited range of motion and stiffness. Pertinent negatives include no fever, inability to bear weight, itching, joint locking, joint swelling, numbness or tingling. The symptoms are aggravated by lying down. She has tried heat for the symptoms. The treatment provided no relief. Her past medical history is significant for diabetes.      Review of Systems  Constitutional: Positive for fatigue. Negative for fever.  Respiratory: Positive for cough.   Musculoskeletal: Positive for stiffness.  Skin: Negative for itching.  Neurological: Positive for weakness. Negative for tingling and numbness.  All other systems reviewed and are negative.       Objective:   Physical Exam  Vitals reviewed. Constitutional: She is oriented to person, place, and time. She appears well-developed and well-nourished. No distress.  HENT:  Head: Normocephalic.  Neck: Normal range of motion. Neck supple. No JVD present. No tracheal deviation present. No thyromegaly present.  Cardiovascular: Intact distal pulses.   Pulmonary/Chest: No stridor.  Lymphadenopathy:    She has no cervical adenopathy.  Neurological: She is oriented to person, place, and time. She has normal reflexes. She displays normal reflexes. She exhibits normal muscle tone. Coordination normal.  Skin: Skin is warm and dry. No rash noted. No erythema. No pallor.  Psychiatric: Her behavior is normal. Judgment and thought content normal.       Flat affect      X-rays are obtained and they show she has a small amount of glenohumeral arthritis there is a cyst near the greater tuberosity the acromion is a type II      Assessment & Plan:  #1 bursitis  #2 osteoarthritis right shoulder  #3 post stroke residual weakness  Strong recommendation for physical therapy  I had an injection to help with the bursitis  No followup needed.  Shoulder Injection Procedure Note   Pre-operative Diagnosis: right  RC Syndrome  Post-operative Diagnosis: same  Indications: pain   Anesthesia: ethyl chloride   Procedure Details   Verbal consent was obtained for the procedure. The shoulder was prepped withalcohol and the skin was anesthetized. A 20 gauge needle was advanced into the subacromial space through posterior approach without difficulty  The space was then injected with 3 ml 1% lidocaine and 1 ml of depomedrol. The injection site was cleansed with isopropyl alcohol and a dressing was applied.  Complications:  None; patient tolerated the procedure well.

## 2013-06-21 ENCOUNTER — Inpatient Hospital Stay (HOSPITAL_COMMUNITY)
Admission: EM | Admit: 2013-06-21 | Discharge: 2013-06-24 | DRG: 074 | Disposition: A | Discharge status: Home / Self Care | Payer: Medicare PPO | Attending: Pulmonary Disease | Admitting: Pulmonary Disease

## 2013-06-21 ENCOUNTER — Encounter (HOSPITAL_COMMUNITY): Payer: Self-pay | Admitting: Emergency Medicine

## 2013-06-21 ENCOUNTER — Emergency Department (HOSPITAL_COMMUNITY): Payer: Medicare PPO

## 2013-06-21 DIAGNOSIS — Z8673 Personal history of transient ischemic attack (TIA), and cerebral infarction without residual deficits: Secondary | ICD-10-CM

## 2013-06-21 DIAGNOSIS — E1149 Type 2 diabetes mellitus with other diabetic neurological complication: Principal | ICD-10-CM | POA: Diagnosis present

## 2013-06-21 DIAGNOSIS — Z7982 Long term (current) use of aspirin: Secondary | ICD-10-CM

## 2013-06-21 DIAGNOSIS — Z79899 Other long term (current) drug therapy: Secondary | ICD-10-CM

## 2013-06-21 DIAGNOSIS — I10 Essential (primary) hypertension: Secondary | ICD-10-CM

## 2013-06-21 DIAGNOSIS — R1115 Cyclical vomiting syndrome unrelated to migraine: Secondary | ICD-10-CM

## 2013-06-21 DIAGNOSIS — E1143 Type 2 diabetes mellitus with diabetic autonomic (poly)neuropathy: Secondary | ICD-10-CM | POA: Diagnosis present

## 2013-06-21 DIAGNOSIS — Z885 Allergy status to narcotic agent status: Secondary | ICD-10-CM

## 2013-06-21 DIAGNOSIS — E785 Hyperlipidemia, unspecified: Secondary | ICD-10-CM | POA: Diagnosis present

## 2013-06-21 DIAGNOSIS — M129 Arthropathy, unspecified: Secondary | ICD-10-CM | POA: Diagnosis present

## 2013-06-21 DIAGNOSIS — E860 Dehydration: Secondary | ICD-10-CM | POA: Diagnosis present

## 2013-06-21 DIAGNOSIS — K3184 Gastroparesis: Secondary | ICD-10-CM | POA: Diagnosis present

## 2013-06-21 DIAGNOSIS — E876 Hypokalemia: Secondary | ICD-10-CM | POA: Diagnosis present

## 2013-06-21 DIAGNOSIS — D72829 Elevated white blood cell count, unspecified: Secondary | ICD-10-CM | POA: Diagnosis present

## 2013-06-21 DIAGNOSIS — Z803 Family history of malignant neoplasm of breast: Secondary | ICD-10-CM

## 2013-06-21 DIAGNOSIS — Z833 Family history of diabetes mellitus: Secondary | ICD-10-CM

## 2013-06-21 DIAGNOSIS — R1110 Vomiting, unspecified: Secondary | ICD-10-CM

## 2013-06-21 DIAGNOSIS — R739 Hyperglycemia, unspecified: Secondary | ICD-10-CM

## 2013-06-21 DIAGNOSIS — F411 Generalized anxiety disorder: Secondary | ICD-10-CM | POA: Diagnosis present

## 2013-06-21 DIAGNOSIS — K219 Gastro-esophageal reflux disease without esophagitis: Secondary | ICD-10-CM | POA: Diagnosis present

## 2013-06-21 DIAGNOSIS — Z800 Family history of malignant neoplasm of digestive organs: Secondary | ICD-10-CM

## 2013-06-21 DIAGNOSIS — E119 Type 2 diabetes mellitus without complications: Secondary | ICD-10-CM

## 2013-06-21 LAB — CBC WITH DIFFERENTIAL/PLATELET
Basophils Absolute: 0 10*3/uL (ref 0.0–0.1)
Basophils Relative: 0 % (ref 0–1)
EOS ABS: 0.1 10*3/uL (ref 0.0–0.7)
EOS PCT: 1 % (ref 0–5)
HEMATOCRIT: 38.8 % (ref 36.0–46.0)
Hemoglobin: 12.5 g/dL (ref 12.0–15.0)
LYMPHS ABS: 2.2 10*3/uL (ref 0.7–4.0)
LYMPHS PCT: 19 % (ref 12–46)
MCH: 27.7 pg (ref 26.0–34.0)
MCHC: 32.2 g/dL (ref 30.0–36.0)
MCV: 85.8 fL (ref 78.0–100.0)
MONO ABS: 0.6 10*3/uL (ref 0.1–1.0)
MONOS PCT: 5 % (ref 3–12)
Neutro Abs: 8.5 10*3/uL — ABNORMAL HIGH (ref 1.7–7.7)
Neutrophils Relative %: 75 % (ref 43–77)
PLATELETS: 235 10*3/uL (ref 150–400)
RBC: 4.52 MIL/uL (ref 3.87–5.11)
RDW: 13.5 % (ref 11.5–15.5)
WBC: 11.3 10*3/uL — AB (ref 4.0–10.5)

## 2013-06-21 LAB — URINALYSIS, ROUTINE W REFLEX MICROSCOPIC
Bilirubin Urine: NEGATIVE
GLUCOSE, UA: 250 mg/dL — AB
Hgb urine dipstick: NEGATIVE
KETONES UR: NEGATIVE mg/dL
LEUKOCYTES UA: NEGATIVE
Nitrite: NEGATIVE
PROTEIN: NEGATIVE mg/dL
Specific Gravity, Urine: 1.02 (ref 1.005–1.030)
UROBILINOGEN UA: 0.2 mg/dL (ref 0.0–1.0)
pH: 7 (ref 5.0–8.0)

## 2013-06-21 LAB — COMPREHENSIVE METABOLIC PANEL
ALT: 18 U/L (ref 0–35)
AST: 20 U/L (ref 0–37)
Albumin: 3.7 g/dL (ref 3.5–5.2)
Alkaline Phosphatase: 157 U/L — ABNORMAL HIGH (ref 39–117)
BUN: 11 mg/dL (ref 6–23)
CALCIUM: 9.3 mg/dL (ref 8.4–10.5)
CO2: 22 meq/L (ref 19–32)
CREATININE: 0.75 mg/dL (ref 0.50–1.10)
Chloride: 95 mEq/L — ABNORMAL LOW (ref 96–112)
GFR, EST NON AFRICAN AMERICAN: 84 mL/min — AB (ref >90)
GLUCOSE: 360 mg/dL — AB (ref 70–99)
Potassium: 3.5 mEq/L — ABNORMAL LOW (ref 3.7–5.3)
Sodium: 136 mEq/L — ABNORMAL LOW (ref 137–147)
TOTAL PROTEIN: 8 g/dL (ref 6.0–8.3)
Total Bilirubin: 0.5 mg/dL (ref 0.3–1.2)

## 2013-06-21 LAB — CBG MONITORING, ED
GLUCOSE-CAPILLARY: 380 mg/dL — AB (ref 70–99)
Glucose-Capillary: 236 mg/dL — ABNORMAL HIGH (ref 70–99)

## 2013-06-21 LAB — PRO B NATRIURETIC PEPTIDE: Pro B Natriuretic peptide (BNP): 204 pg/mL — ABNORMAL HIGH (ref 0–125)

## 2013-06-21 LAB — GLUCOSE, CAPILLARY: Glucose-Capillary: 220 mg/dL — ABNORMAL HIGH (ref 70–99)

## 2013-06-21 LAB — TROPONIN I

## 2013-06-21 MED ORDER — ALUM & MAG HYDROXIDE-SIMETH 200-200-20 MG/5ML PO SUSP: 30.0000 mL | Freq: Four times a day (QID) | ORAL | Status: DC | PRN

## 2013-06-21 MED ORDER — ONDANSETRON HCL 4 MG/2ML IJ SOLN
4.0000 mg | Freq: Once | INTRAMUSCULAR | Status: AC
  Administered 2013-06-21: 4 mg via INTRAVENOUS
  Filled 2013-06-21: qty 2

## 2013-06-21 MED ORDER — ONDANSETRON HCL 4 MG/2ML IJ SOLN
4.0000 mg | Freq: Four times a day (QID) | INTRAMUSCULAR | Status: DC | PRN
  Administered 2013-06-21 – 2013-06-22 (×2): 4 mg via INTRAVENOUS
  Filled 2013-06-21 (×2): qty 2

## 2013-06-21 MED ORDER — VERAPAMIL HCL ER 240 MG PO TBCR
240.0000 mg | EXTENDED_RELEASE_TABLET | Freq: Every day | ORAL | Status: DC
  Administered 2013-06-22 – 2013-06-24 (×3): 240 mg via ORAL
  Filled 2013-06-21 (×3): qty 1

## 2013-06-21 MED ORDER — SODIUM CHLORIDE 0.9 % IV BOLUS (SEPSIS)
500.0000 mL | Freq: Once | INTRAVENOUS | Status: AC
  Administered 2013-06-21: 500 mL via INTRAVENOUS

## 2013-06-21 MED ORDER — SIMVASTATIN 20 MG PO TABS: 20.0000 mg | ORAL_TABLET | Freq: Every day | ORAL | Status: DC

## 2013-06-21 MED ORDER — ONDANSETRON HCL 4 MG PO TABS: 4.0000 mg | ORAL_TABLET | Freq: Four times a day (QID) | ORAL | Status: DC | PRN

## 2013-06-21 MED ORDER — ALBUTEROL SULFATE (2.5 MG/3ML) 0.083% IN NEBU: 2.5000 mg | INHALATION_SOLUTION | RESPIRATORY_TRACT | Status: DC | PRN

## 2013-06-21 MED ORDER — HEPARIN SODIUM (PORCINE) 5000 UNIT/ML IJ SOLN
5000.0000 [IU] | Freq: Three times a day (TID) | INTRAMUSCULAR | Status: DC
  Administered 2013-06-21 – 2013-06-24 (×8): 5000 [IU] via SUBCUTANEOUS
  Filled 2013-06-21 (×8): qty 1

## 2013-06-21 MED ORDER — PANTOPRAZOLE SODIUM 40 MG IV SOLR
40.0000 mg | Freq: Once | INTRAVENOUS | Status: AC
  Administered 2013-06-21: 40 mg via INTRAVENOUS
  Filled 2013-06-21: qty 40

## 2013-06-21 MED ORDER — ASPIRIN 81 MG PO CHEW
81.0000 mg | CHEWABLE_TABLET | Freq: Every day | ORAL | Status: DC
  Administered 2013-06-23 – 2013-06-24 (×2): 81 mg via ORAL
  Filled 2013-06-21 (×3): qty 1

## 2013-06-21 MED ORDER — SODIUM CHLORIDE 0.9 % IJ SOLN
3.0000 mL | Freq: Two times a day (BID) | INTRAMUSCULAR | Status: DC
  Administered 2013-06-22 – 2013-06-23 (×2): 3 mL via INTRAVENOUS

## 2013-06-21 MED ORDER — GABAPENTIN 300 MG PO CAPS
900.0000 mg | ORAL_CAPSULE | Freq: Every day | ORAL | Status: DC
  Administered 2013-06-22 – 2013-06-23 (×2): 900 mg via ORAL
  Filled 2013-06-21 (×2): qty 3

## 2013-06-21 MED ORDER — GUAIFENESIN-DM 100-10 MG/5ML PO SYRP: 5.0000 mL | ORAL_SOLUTION | ORAL | Status: DC | PRN

## 2013-06-21 MED ORDER — ADULT MULTIVITAMIN W/MINERALS CH
1.0000 | ORAL_TABLET | Freq: Every day | ORAL | Status: DC
  Administered 2013-06-22 – 2013-06-24 (×3): 1 via ORAL
  Filled 2013-06-21 (×3): qty 1

## 2013-06-21 MED ORDER — ACETAMINOPHEN 325 MG PO TABS
650.0000 mg | ORAL_TABLET | Freq: Four times a day (QID) | ORAL | Status: DC | PRN
  Administered 2013-06-21 – 2013-06-22 (×2): 650 mg via ORAL
  Filled 2013-06-21 (×2): qty 2

## 2013-06-21 MED ORDER — SODIUM CHLORIDE 0.9 % IV SOLN: INTRAVENOUS | Status: DC

## 2013-06-21 MED ORDER — PANTOPRAZOLE SODIUM 40 MG IV SOLR
40.0000 mg | Freq: Two times a day (BID) | INTRAVENOUS | Status: DC
  Administered 2013-06-21 – 2013-06-23 (×4): 40 mg via INTRAVENOUS
  Filled 2013-06-21 (×4): qty 40

## 2013-06-21 MED ORDER — POTASSIUM CHLORIDE IN NACL 20-0.9 MEQ/L-% IV SOLN
INTRAVENOUS | Status: DC
  Administered 2013-06-21 – 2013-06-23 (×5): via INTRAVENOUS

## 2013-06-21 MED ORDER — INSULIN GLARGINE 100 UNIT/ML ~~LOC~~ SOLN
10.0000 [IU] | Freq: Every day | SUBCUTANEOUS | Status: DC
Start: 2013-06-21 — End: 2013-06-22
  Administered 2013-06-21: 10 [IU] via SUBCUTANEOUS
  Filled 2013-06-21: qty 0.1

## 2013-06-21 MED ORDER — PROMETHAZINE HCL 25 MG/ML IJ SOLN: 12.5000 mg | Freq: Four times a day (QID) | INTRAMUSCULAR | Status: DC | PRN

## 2013-06-21 MED ORDER — INSULIN ASPART 100 UNIT/ML ~~LOC~~ SOLN
10.0000 [IU] | Freq: Once | SUBCUTANEOUS | Status: AC
  Administered 2013-06-21: 10 [IU] via INTRAVENOUS
  Filled 2013-06-21: qty 1

## 2013-06-21 MED ORDER — INSULIN ASPART 100 UNIT/ML ~~LOC~~ SOLN
0.0000 [IU] | Freq: Three times a day (TID) | SUBCUTANEOUS | Status: DC
  Administered 2013-06-22 (×2): 2 [IU] via SUBCUTANEOUS
  Administered 2013-06-22 – 2013-06-23 (×4): 3 [IU] via SUBCUTANEOUS
  Administered 2013-06-24: 2 [IU] via SUBCUTANEOUS

## 2013-06-21 MED ORDER — SODIUM CHLORIDE 0.9 % IV SOLN
INTRAVENOUS | Status: DC
  Administered 2013-06-21: 13:00:00 via INTRAVENOUS

## 2013-06-21 MED ORDER — HYDRALAZINE HCL 20 MG/ML IJ SOLN: 10.0000 mg | Freq: Four times a day (QID) | INTRAMUSCULAR | Status: DC | PRN

## 2013-06-21 MED ORDER — ACETAMINOPHEN 650 MG RE SUPP: 650.0000 mg | Freq: Four times a day (QID) | RECTAL | Status: DC | PRN

## 2013-06-21 MED ORDER — ATORVASTATIN CALCIUM 10 MG PO TABS
10.0000 mg | ORAL_TABLET | Freq: Every day | ORAL | Status: DC
  Administered 2013-06-22 – 2013-06-23 (×2): 10 mg via ORAL
  Filled 2013-06-21 (×2): qty 1

## 2013-06-21 NOTE — ED Notes (Signed)
MD at bedside. 

## 2013-06-21 NOTE — H&P (Signed)
PATIENT DETAILS Name: Lori Mahoney Age: 68 y.o. Sex: female Date of Birth: 05-Sep-1945 Admit Date: 06/21/2013 GBT:[id number],VPXTGG L, MD   CHIEF COMPLAINT:  Nausea and vomiting since Tuesday  HPI: Lori Mahoney is a 68 y.o. female with a Past Medical History of prior episodes of vomiting, diabetes, hypertension and dyslipidemia who presents today with the above noted complaint. The patient and her daughter at bedside Lori Mahoney), for the past few days-since Tuesday-patient has had progressively worsening nausea and vomiting. Because of persistent vomiting she has not been able to take her medications, and has hardly any oral intake. Because of these reasons, patient was brought to the emergency room, I was asked to admit this patient for further evaluation and treatment. Patient denies any headache, denies any chest pain or abdominal pain. Patient's last bowel movement was yesterday. Over the past few years, patient has had repeated episodes of nausea and vomiting that resolved with supportive care.   ALLERGIES:   Allergies  Allergen Reactions  . Codeine Nausea And Vomiting  . Hydrocodone-Acetaminophen Nausea And Vomiting  . Meperidine Hcl Nausea And Vomiting  . Contrast Media [Iodinated Diagnostic Agents] Nausea And Vomiting  . Oxycodone Nausea And Vomiting  . Tetracyclines & Related Rash    PAST MEDICAL HISTORY: Past Medical History  Diagnosis Date  . Diabetes mellitus   . Hypertension   . Neuropathy   . GERD (gastroesophageal reflux disease)   . Complication of anesthesia   . PONV (postoperative nausea and vomiting)   . H/O exercise stress test     good result- 2003.  Also had Echocardiogram 10 yrs. ago, told that valve was thickening, but no need for changes , no need for f/u  . Shortness of breath   . H/O hiatal hernia   . Neuromuscular disorder     neuropathy- legs & feet   . Arthritis     hands, hips, back   . Anemia     borderline anemia   . Cancer   .  Stroke     PAST SURGICAL HISTORY: Past Surgical History  Procedure Laterality Date  . Knee surgery    . Abdominal hysterectomy    . Back surgery  02/2009    L3-S1 fusion  . Colonoscopy    . Upper gastrointestinal endoscopy    . Joint replacement      left  . Eye surgery      cataracts removed- R eye    . Stapedectomy      bilateral, then a 2nd time to R ear    MEDICATIONS AT HOME: Prior to Admission medications   Medication Sig Start Date End Date Taking? Authorizing Provider  aspirin 81 MG tablet Take 81 mg by mouth daily.   Yes Historical Provider, MD  gabapentin (NEURONTIN) 300 MG capsule Take 900 mg by mouth at bedtime.  09/26/10  Yes Historical Provider, MD  Multiple Vitamins-Minerals (MULTIVITAMINS THER. W/MINERALS) TABS tablet Take 1 tablet by mouth daily.   Yes Historical Provider, MD  pravastatin (PRAVACHOL) 40 MG tablet Take 40 mg by mouth at bedtime.    Yes Historical Provider, MD  Probiotic Product (PROBIOTIC DAILY PO) Take 1 capsule by mouth at bedtime.   Yes Historical Provider, MD  SitaGLIPtin Phosphate (JANUVIA PO) Take 1 tablet by mouth daily.   Yes Historical Provider, MD  verapamil (CALAN-SR) 240 MG CR tablet Take 240 mg by mouth daily with breakfast.  09/23/10  Yes Historical Provider, MD  FAMILY HISTORY: Family History  Problem Relation Age of Onset  . Arthritis    . Cancer    . Colon cancer Mother   . Breast cancer Sister   . Diabetes Brother     SOCIAL HISTORY:  reports that she has never smoked. She has never used smokeless tobacco. She reports that she does not drink alcohol or use illicit drugs.  REVIEW OF SYSTEMS:  Constitutional:   No  weight loss, night sweats,  Fevers, chills, fatigue.  HEENT:    No headaches, Difficulty swallowing,Tooth/dental problems,Sore throat,  No sneezing, itching, ear ache, nasal congestion, post nasal drip,   Cardio-vascular: No chest pain,  Orthopnea, PND, swelling in lower extremities, anasarca,dizziness,  palpitations  GI:  No heartburn, indigestion, abdominal pain, diarrhea, change in  bowel habits, loss of appetite  Resp: No shortness of breath with exertion or at rest.  No excess mucus, no productive cough, No non-productive cough,  No coughing up of blood.No change in color of mucus.No wheezing.No chest wall deformity  Skin:  no rash or lesions.  GU:  no dysuria, change in color of urine, no urgency or frequency.  No flank pain.  Musculoskeletal: No joint pain or swelling.  No decreased range of motion.  No back pain.  Psych: No change in mood or affect. No depression or anxiety.  No memory loss.   PHYSICAL EXAM: Blood pressure 160/92, pulse 97, temperature 97 F (36.1 C), temperature source Oral, resp. rate 17, height 5\' 3"  (1.6 m), weight 91.173 kg (201 lb), SpO2 93.00%.  General appearance :Awake, alert, not in any distress. Speech Clear. Not toxic Looking HEENT: Atraumatic and Normocephalic, pupils equally reactive to light and accomodation Neck: supple, no JVD. No cervical lymphadenopathy.  Chest:Good air entry bilaterally, no added sounds  CVS: S1 S2 regular, no murmurs.  Abdomen: Bowel sounds present, Non tender and not distended with no gaurding, rigidity or rebound. Extremities: B/L Lower Ext shows no edema, both legs are warm to touch Neurology: Awake alert, and oriented X 3, CN II-XII intact, Non focal Skin:No Rash Wounds:N/A  LABS ON ADMISSION:   Recent Labs  06/21/13 1044  NA 136*  K 3.5*  CL 95*  CO2 22  GLUCOSE 360*  BUN 11  CREATININE 0.75  CALCIUM 9.3    Recent Labs  06/21/13 1044  AST 20  ALT 18  ALKPHOS 157*  BILITOT 0.5  PROT 8.0  ALBUMIN 3.7   No results found for this basename: LIPASE, AMYLASE,  in the last 72 hours  Recent Labs  06/21/13 1044  WBC 11.3*  NEUTROABS 8.5*  HGB 12.5  HCT 38.8  MCV 85.8  PLT 235    Recent Labs  06/21/13 1044  TROPONINI <0.30   No results found for this basename: DDIMER,  in the last  72 hours No components found with this basename: POCBNP,    RADIOLOGIC STUDIES ON ADMISSION: Dg Abd Acute W/chest  06/21/2013   CLINICAL DATA:  Vomiting.  Elevated blood sugar.  EXAM: ACUTE ABDOMEN SERIES (ABDOMEN 2 VIEW & CHEST 1 VIEW)  COMPARISON:  Chest x-ray 10/07/2011.  FINDINGS: Mediastinum and hilar structures normal. Stable cardiomegaly. No pulmonary venous congestion noted on today's exam. No pleural effusion or pneumothorax. No acute osseous abnormality.  Abdominal soft tissues unremarkable. The gas pattern is nonspecific. Prior lumbar spine fusion. Calcifications within the pelvis consistent with phleboliths.  IMPRESSION: 1. Chest x-ray reveals mild cardiomegaly, no evidence of overt congestive heart failure. Previously identified pulmonary venous congestion  on chest x-ray of 10/07/2011 no longer present. 2. Abdomen is nonfocal.  Prior lumbar spine fusion.   Electronically Signed   By: Marcello Moores  Register   On: 06/21/2013 13:46     EKG: Independently reviewed. Right bundle branch block pattern  ASSESSMENT AND PLAN: Present on Admission:  . Persistent Vomiting - Highly suspicious for diabetic gastroparesis. She had a EGD 3-4 years ago that was unremarkable, she also had a gastric emptying scan that was negative in 2012. Belly is soft on exam, abdominal x-ray does not show any acute abnormalities. We'll admit and provide supportive measures including IV fluids, antiemetics as needed and PPI. - Given high suspicion for gastroparesis, will order a nuclear gastric emptying scan, have also consulted gastroenterology.Keep NPO for now, avoid Reglan as Gastric Emptying Study has been ordered.  Marland Kitchen DIABETES - Hold oral hypoglycemic agents for now, placed on low-dose Lantus, and SSI. Follow CBGs and adjust accordingly.   Marland Kitchen HTN (hypertension) - Continue verapamil if able to take oral medications, otherwise will place on as needed hydralazine.   . Dyslipidemia - Continue statins if able to take  oral medications- otherwise resume when able.  Further plan will depend as patient's clinical course evolves and further radiologic and laboratory data become available. Patient will be monitored closely.  Above noted plan was discussed with patient/daughter-Angela, they were in agreement.   DVT Prophylaxis: Prophylactic  Heparin  Code Status: Full Code  Total time spent for admission equals 45 minutes.  Jonetta Osgood [facility] Pager [phone]  If 7PM-7AM, please contact night-coverage [email or web address] Password [email or web address] 06/21/2013, 5:29 PM  **Disclaimer: This note may have been dictated with voice recognition software. Similar sounding words can inadvertently be transcribed and this note may contain transcription errors which may not have been corrected upon publication of note.**

## 2013-06-21 NOTE — ED Provider Notes (Signed)
CSN: [id number]     Arrival date & time 06/21/13  0901 History  This chart was scribed for Lori Sorrow, MD by Zettie Pho, ED Scribe. This patient was seen in room APA10/[facility]10 and the patient's care was started at 11:38 AM.    Chief Complaint  Patient presents with  . Emesis   Patient is a 68 y.o. female presenting with vomiting. The history is provided by the patient. No language interpreter was used.  Emesis Severity:  Mild Timing:  Sporadic Quality:  Stomach contents Progression:  Unchanged Chronicity:  New Relieved by:  Nothing Ineffective treatments:  None tried Associated symptoms: chills and headaches   Associated symptoms: no abdominal pain, no diarrhea, no fever and no sore throat   Headaches:    Severity:  Mild   Onset quality:  Gradual   Timing:  Constant   Progression:  Unchanged   Chronicity:  New Risk factors: diabetes and prior abdominal surgery   Risk factors: no sick contacts    HPI Comments: Lori Mahoney is a 69 y.o. Female with a history of DM and GERD who presents to the Emergency Department complaining of 3 episodes of non-bilious, non-bloody emesis with associated nausea, chills, generalized headache and neck pain onset last night around 8:00 PM, about 15.5 hours ago. Patient reports that she has not been able to tolerate either solids or liquids well with associated decreased appetite. She reports that her blood sugar has been running high lately (CBG around 300 measured at home, CBG of 380 measured in the ED). Patient denies any exposure to sick contacts with similar symptoms. Patient reports some intermittent itching to the bilateral hands that she states will present about every 6 months, but has progressively worsening over the past few days. She reports some intermittent swelling to the left ankle. She denies diarrhea, abdominal pain, fever, cough, sore throat, rhinorrhea, chest pain, shortness of breath, dysuria, rash, history of bleeding easily, back  pain. Patient has a surgical history of abdominal hysterectomy, colonoscopy, upper GI endoscopy. Patient has allergies to codeine, hydrocodone-acetaminophen, oxycodone, Meperidine HCl, contrast media, tetracyclines and related drugs. Patient also has a history of neuropathy, HTN, anemia, cancer, and stroke.   Past Medical History  Diagnosis Date  . Diabetes mellitus   . Hypertension   . Neuropathy   . GERD (gastroesophageal reflux disease)   . Complication of anesthesia   . PONV (postoperative nausea and vomiting)   . H/O exercise stress test     good result- 2003.  Also had Echocardiogram 10 yrs. ago, told that valve was thickening, but no need for changes , no need for f/u  . Shortness of breath   . H/O hiatal hernia   . Neuromuscular disorder     neuropathy- legs & feet   . Arthritis     hands, hips, back   . Anemia     borderline anemia   . Cancer   . Stroke    Past Surgical History  Procedure Laterality Date  . Knee surgery    . Abdominal hysterectomy    . Back surgery  02/2009    L3-S1 fusion  . Colonoscopy    . Upper gastrointestinal endoscopy    . Joint replacement      left  . Eye surgery      cataracts removed- R eye    . Stapedectomy      bilateral, then a 2nd time to R ear   Family History  Problem Relation Age of  Onset  . Arthritis    . Cancer    . Colon cancer Mother   . Breast cancer Sister   . Diabetes Brother    History  Substance Use Topics  . Smoking status: Never Smoker   . Smokeless tobacco: Never Used  . Alcohol Use: No   OB History   Grav Para Term Preterm Abortions TAB SAB Ect Mult Living                 Review of Systems  Constitutional: Positive for chills and appetite change (decreased). Negative for fever.  HENT: Negative for rhinorrhea and sore throat.   Eyes: Negative for visual disturbance.  Respiratory: Negative for cough and shortness of breath.   Cardiovascular: Negative for chest pain and leg swelling.  Gastrointestinal:  Positive for nausea and vomiting. Negative for abdominal pain and diarrhea.  Genitourinary: Negative for dysuria and hematuria.  Musculoskeletal: Positive for neck pain. Negative for back pain.  Skin: Negative for rash.  Neurological: Positive for headaches.  Hematological: Does not bruise/bleed easily.  Psychiatric/Behavioral: Negative for confusion.      Allergies  Codeine; Hydrocodone-acetaminophen; Meperidine hcl; Contrast media; Oxycodone; and Tetracyclines & related  Home Medications   Prior to Admission medications   Medication Sig Start Date End Date Taking? Authorizing Provider  aspirin 81 MG tablet Take 81 mg by mouth daily.    Historical Provider, MD  cyclobenzaprine (FLEXERIL) 10 MG tablet Take 10 mg by mouth 3 (three) times daily.    Historical Provider, MD  diphenoxylate-atropine (LOMOTIL) 2.5-0.025 MG per tablet Take 1 tablet by mouth 4 (four) times daily as needed for diarrhea or loose stools. 10/12/11   Johnna Acosta, MD  gabapentin (NEURONTIN) 300 MG capsule Take 1,200 mg by mouth at bedtime.  09/26/10   Historical Provider, MD  glyBURIDE (DIABETA) 5 MG tablet Take 5-10 mg by mouth. Patient is taking 2 in the morning and 1 at night 09/23/10   Historical Provider, MD  HYDROmorphone (DILAUDID) 4 MG tablet Take 4 mg by mouth every 4 (four) hours as needed. pain    Historical Provider, MD  Lactobacillus (ACIDOPHILUS PO) Take 1 tablet by mouth daily as needed. For constipation    Historical Provider, MD  olmesartan (BENICAR) 20 MG tablet Take 20 mg by mouth daily.    Historical Provider, MD  ondansetron (ZOFRAN ODT) 4 MG disintegrating tablet Take 1 tablet (4 mg total) by mouth every 8 (eight) hours as needed for nausea. 10/12/11   Johnna Acosta, MD  pravastatin (PRAVACHOL) 40 MG tablet Take 40 mg by mouth at bedtime.     Historical Provider, MD  promethazine (PHENERGAN) 25 MG tablet Take 1 tablet (25 mg total) by mouth every 6 (six) hours as needed for nausea. 10/12/11   Johnna Acosta, MD  sertraline (ZOLOFT) 50 MG tablet Take 1 tablet (50 mg total) by mouth daily. 10/11/11 10/10/12  Alonza Bogus, MD  verapamil (CALAN-SR) 240 MG CR tablet Take 240 mg by mouth daily with breakfast.  09/23/10   Historical Provider, MD   Triage Vitals: BP 172/92  Pulse 87  Temp(Src) 97 F (36.1 C) (Oral)  Resp 24  Ht 5\' 3"  (1.6 m)  Wt 201 lb (91.173 kg)  BMI 35.61 kg/m2  SpO2 99%  Physical Exam  Nursing note and vitals reviewed. Constitutional: She is oriented to person, place, and time. She appears well-developed and well-nourished. No distress.  HENT:  Head: Normocephalic and atraumatic.  Mouth/Throat: Oropharynx  is clear and moist.  Eyes: Conjunctivae and EOM are normal.  Neck: Normal range of motion. Neck supple.  Cardiovascular: Normal rate, regular rhythm and normal heart sounds.   Pulmonary/Chest: Effort normal and breath sounds normal. No respiratory distress.  Abdominal: Soft. She exhibits no distension.  Musculoskeletal: Normal range of motion. She exhibits no edema.  Neurological: She is alert and oriented to person, place, and time. No cranial nerve deficit. She exhibits normal muscle tone. Coordination normal.  Skin: Skin is warm and dry.  Psychiatric: She has a normal mood and affect. Her behavior is normal.    ED Course  Procedures (including critical care time)  DIAGNOSTIC STUDIES: Oxygen Saturation is 99% on room air, normal by my interpretation.    COORDINATION OF CARE: 10:25 AM- CBC and CMP were ordered.   10:46 AM- CBG was ordered.   11:46 AM- Discussed that preliminary CBG was elevated, but that labs do not indicate DKA. Will order UA, troponin, BNP, EKG, and an x-ray of the abdomen. Will order IV fluids, Zofran, and Novolog to manage symptoms. Discussed treatment plan with patient at bedside and patient verbalized agreement.   Medications  0.9 %  sodium chloride infusion ( Intravenous New Bag/Given 06/21/13 1236)  sodium chloride 0.9 % bolus  500 mL (0 mLs Intravenous Stopped 06/21/13 1332)  ondansetron (ZOFRAN) injection 4 mg (4 mg Intravenous Given 06/21/13 1236)  insulin aspart (novoLOG) injection 10 Units (10 Units Intravenous Given 06/21/13 1238)   Results for orders placed during the hospital encounter of 06/21/13  CBC WITH DIFFERENTIAL      Result Value Ref Range   WBC 11.3 (*) 4.0 - 10.5 K/uL   RBC 4.52  3.87 - 5.11 MIL/uL   Hemoglobin 12.5  12.0 - 15.0 g/dL   HCT 38.8  36.0 - 46.0 %   MCV 85.8  78.0 - 100.0 fL   MCH 27.7  26.0 - 34.0 pg   MCHC 32.2  30.0 - 36.0 g/dL   RDW 13.5  11.5 - 15.5 %   Platelets 235  150 - 400 K/uL   Neutrophils Relative % 75  43 - 77 %   Neutro Abs 8.5 (*) 1.7 - 7.7 K/uL   Lymphocytes Relative 19  12 - 46 %   Lymphs Abs 2.2  0.7 - 4.0 K/uL   Monocytes Relative 5  3 - 12 %   Monocytes Absolute 0.6  0.1 - 1.0 K/uL   Eosinophils Relative 1  0 - 5 %   Eosinophils Absolute 0.1  0.0 - 0.7 K/uL   Basophils Relative 0  0 - 1 %   Basophils Absolute 0.0  0.0 - 0.1 K/uL  COMPREHENSIVE METABOLIC PANEL      Result Value Ref Range   Sodium 136 (*) 137 - 147 mEq/L   Potassium 3.5 (*) 3.7 - 5.3 mEq/L   Chloride 95 (*) 96 - 112 mEq/L   CO2 22  19 - 32 mEq/L   Glucose, Bld 360 (*) 70 - 99 mg/dL   BUN 11  6 - 23 mg/dL   Creatinine, Ser 0.75  0.50 - 1.10 mg/dL   Calcium 9.3  8.4 - 10.5 mg/dL   Total Protein 8.0  6.0 - 8.3 g/dL   Albumin 3.7  3.5 - 5.2 g/dL   AST 20  0 - 37 U/L   ALT 18  0 - 35 U/L   Alkaline Phosphatase 157 (*) 39 - 117 U/L   Total Bilirubin 0.5  0.3 -  1.2 mg/dL   GFR calc non Af Amer 84 (*) >90 mL/min   GFR calc Af Amer >90  >90 mL/min  TROPONIN I      Result Value Ref Range   Troponin I <0.30  <0.30 ng/mL  PRO B NATRIURETIC PEPTIDE      Result Value Ref Range   Pro B Natriuretic peptide (BNP) 204.0 (*) 0 - 125 pg/mL  CBG MONITORING, ED      Result Value Ref Range   Glucose-Capillary 380 (*) 70 - 99 mg/dL   Comment 1 Documented in Chart    CBG MONITORING, ED      Result  Value Ref Range   Glucose-Capillary 236 (*) 70 - 99 mg/dL   Comment 1 Documented in Chart     Dg Abd Acute W/chest  06/21/2013   CLINICAL DATA:  Vomiting.  Elevated blood sugar.  EXAM: ACUTE ABDOMEN SERIES (ABDOMEN 2 VIEW & CHEST 1 VIEW)  COMPARISON:  Chest x-ray 10/07/2011.  FINDINGS: Mediastinum and hilar structures normal. Stable cardiomegaly. No pulmonary venous congestion noted on today's exam. No pleural effusion or pneumothorax. No acute osseous abnormality.  Abdominal soft tissues unremarkable. The gas pattern is nonspecific. Prior lumbar spine fusion. Calcifications within the pelvis consistent with phleboliths.  IMPRESSION: 1. Chest x-ray reveals mild cardiomegaly, no evidence of overt congestive heart failure. Previously identified pulmonary venous congestion on chest x-ray of 10/07/2011 no longer present. 2. Abdomen is nonfocal.  Prior lumbar spine fusion.   Electronically Signed   By: Marcello Moores  Register   On: 06/21/2013 13:46    EKG Interpretation   Date/Time:  Thursday Jun 21 2013 13:14:29 EDT Ventricular Rate:  95 PR Interval:  185 QRS Duration: 159 QT Interval:  448 QTC Calculation: 563 R Axis:   -29 Text Interpretation:  Sinus rhythm Right bundle branch block Inferior  infarct, old No significant change since last tracing Confirmed by  Zacarias Krauter  MD, Drexel Ivey [phone]) on 06/21/2013 1:29:15 PM      MDM   Final diagnoses:  Persistent vomiting  Hyperglycemia    Patient with persistent vomiting despite Zofran. Patient blood sugar improved with the IV hydration and IV insulin 10 units. Patient may have a component of gastroparesis. Abdominal x-rays without any significant findings. Electrolytes without significant abnormalities some mild hyponatremia and hypokalemia. No evidence of diabetic ketoacidosis. Mild leukocytosis. We'll discuss with Dr. Luan Pulling patient's primary care Dr. for admission and continued IV fluids. X-rays of the abdomen without any evidence of bowel obstruction.  No bowel pain as per patient.  I personally performed the services described in this documentation, which was scribed in my presence. The recorded information has been reviewed and is accurate.      Lori Sorrow, MD 06/21/13 [phone]

## 2013-06-21 NOTE — ED Notes (Signed)
Complain of vomiting and elevated blood sugar

## 2013-06-21 NOTE — ED Notes (Signed)
Pt c/o severe itching

## 2013-06-22 ENCOUNTER — Encounter (HOSPITAL_COMMUNITY): Payer: Self-pay

## 2013-06-22 ENCOUNTER — Inpatient Hospital Stay (HOSPITAL_COMMUNITY): Payer: Medicare PPO

## 2013-06-22 DIAGNOSIS — R1115 Cyclical vomiting syndrome unrelated to migraine: Secondary | ICD-10-CM

## 2013-06-22 LAB — GLUCOSE, CAPILLARY
GLUCOSE-CAPILLARY: 265 mg/dL — AB (ref 70–99)
Glucose-Capillary: 197 mg/dL — ABNORMAL HIGH (ref 70–99)
Glucose-Capillary: 223 mg/dL — ABNORMAL HIGH (ref 70–99)
Glucose-Capillary: 210 mg/dL — ABNORMAL HIGH (ref 70–99)

## 2013-06-22 LAB — HEMOGLOBIN A1C
HEMOGLOBIN A1C: 10.3 % — AB (ref <5.7)
Hgb A1c MFr Bld: 10.1 % — ABNORMAL HIGH (ref <5.7)
Mean Plasma Glucose: 243 mg/dL — ABNORMAL HIGH (ref <117)
Mean Plasma Glucose: 249 mg/dL — ABNORMAL HIGH (ref <117)

## 2013-06-22 LAB — BASIC METABOLIC PANEL
BUN: 9 mg/dL (ref 6–23)
CALCIUM: 8.6 mg/dL (ref 8.4–10.5)
CO2: 23 meq/L (ref 19–32)
Chloride: 101 mEq/L (ref 96–112)
Creatinine, Ser: 0.71 mg/dL (ref 0.50–1.10)
GFR calc Af Amer: >90 mL/min (ref >90)
GFR, EST NON AFRICAN AMERICAN: 86 mL/min — AB (ref >90)
GLUCOSE: 221 mg/dL — AB (ref 70–99)
Potassium: 3.7 mEq/L (ref 3.7–5.3)
Sodium: 137 mEq/L (ref 137–147)

## 2013-06-22 LAB — CBC
HCT: 35.2 % — ABNORMAL LOW (ref 36.0–46.0)
Hemoglobin: 11.4 g/dL — ABNORMAL LOW (ref 12.0–15.0)
MCH: 27.9 pg (ref 26.0–34.0)
MCHC: 32.4 g/dL (ref 30.0–36.0)
MCV: 86.1 fL (ref 78.0–100.0)
Platelets: 216 10*3/uL (ref 150–400)
RBC: 4.09 MIL/uL (ref 3.87–5.11)
RDW: 13.6 % (ref 11.5–15.5)
WBC: 10.2 10*3/uL (ref 4.0–10.5)

## 2013-06-22 LAB — TROPONIN I
Troponin I: <0.3 ng/mL (ref <0.30)
Troponin I: <0.3 ng/mL (ref <0.30)

## 2013-06-22 MED ORDER — ONDANSETRON HCL 4 MG/2ML IJ SOLN
4.0000 mg | Freq: Three times a day (TID) | INTRAMUSCULAR | Status: DC | PRN
  Administered 2013-06-22: 4 mg via INTRAVENOUS
  Filled 2013-06-22: qty 2

## 2013-06-22 MED ORDER — TECHNETIUM TC 99M SULFUR COLLOID: 2.0000 | Freq: Once | INTRAVENOUS | Status: AC | PRN

## 2013-06-22 MED ORDER — METOCLOPRAMIDE HCL 5 MG/ML IJ SOLN
5.0000 mg | Freq: Three times a day (TID) | INTRAMUSCULAR | Status: DC
  Administered 2013-06-22 – 2013-06-24 (×5): 5 mg via INTRAVENOUS
  Filled 2013-06-22 (×5): qty 2

## 2013-06-22 MED ORDER — INSULIN GLARGINE 100 UNIT/ML ~~LOC~~ SOLN
12.0000 [IU] | Freq: Every day | SUBCUTANEOUS | Status: DC
  Administered 2013-06-22: 12 [IU] via SUBCUTANEOUS
  Filled 2013-06-22 (×3): qty 0.12

## 2013-06-22 MED ORDER — ONDANSETRON HCL 4 MG PO TABS: 4.0000 mg | ORAL_TABLET | Freq: Three times a day (TID) | ORAL | Status: DC | PRN

## 2013-06-22 MED ORDER — BIOTENE DRY MOUTH MT LIQD
15.0000 mL | Freq: Two times a day (BID) | OROMUCOSAL | Status: DC
  Administered 2013-06-22 – 2013-06-24 (×5): 15 mL via OROMUCOSAL

## 2013-06-22 MED ORDER — INSULIN STARTER KIT- PEN NEEDLES (ENGLISH)
1.0000 | Freq: Once | Status: AC
  Administered 2013-06-22: 1
  Filled 2013-06-22: qty 1

## 2013-06-22 MED ORDER — LIVING WELL WITH DIABETES BOOK
Freq: Once | Status: AC
  Administered 2013-06-22: 1
  Filled 2013-06-22: qty 1

## 2013-06-22 NOTE — Progress Notes (Signed)
Notified Dr. Willey Blade of patients return from her Gastric study and the results.  Pt request liquids new orders given and followed.  MD aware the patient is on new orders for Reglan per GI.  According to the patient she is not nauseous at this time.

## 2013-06-22 NOTE — Care Management Note (Signed)
    Page 1 of 1   06/22/2013     3:02:21 PM CARE MANAGEMENT NOTE 06/22/2013  Patient:  Lori Mahoney, Lori Mahoney   Account Number:  [id number]  Date Initiated:  06/22/2013  Documentation initiated by:  Theophilus Kinds  Subjective/Objective Assessment:   Pt admitted from home with intractable nausea and vomiting. Pt lives alone and has 2 daughters who are very active in the care of the pt. Pt also has neighbors who are very supportive. Pt has walker, cane, shower chair, and elevated BSC.     Action/Plan:   Pt denies any HH needs at this time. Pt feels safe returning  home at discharge. ALF was suggested when pt is ready and she said she was not ready yet. If pt agrees to [facility] weekend staff can arrange.   Anticipated DC Date:  06/25/2013   Anticipated DC Plan:  [place]  CM consult      Choice offered to / List presented to:             Status of service:  Completed, signed off Medicare Important Message given?  YES (If response is "NO", the following Medicare IM given date fields will be blank) Date Medicare IM given:  06/22/2013 Date Additional Medicare IM given:    Discharge Disposition:  HOME/SELF CARE  Per UR Regulation:    If discussed at Long Length of Stay Meetings, dates discussed:    Comments:  06/22/13 [place], RN BSN CM

## 2013-06-22 NOTE — Progress Notes (Signed)
Attempted to teach the patient about insulin administration.  She did not feel well today and she was nauseous and stated she did not want to attempt at this time.  She is open to the idea.  I discussed the issue with her and she understands that she will need to learn prior to d/c from the hospital.  I did give her book and start kit.  I told her to call me with any questions.  She and her daughters verbalized understanding.

## 2013-06-22 NOTE — Progress Notes (Signed)
Inpatient Diabetes Program Recommendations  AACE/ADA: New Consensus Statement on Inpatient Glycemic Control (2013)  Target Ranges:  Prepandial:   less than 140 mg/dL      Peak postprandial:   less than 180 mg/dL (1-2 hours)      Critically ill patients:  140 - 180 mg/dL   Results for [place], Daisey C (MRN [id number]) as of 06/22/2013 07:54  Ref. Range 06/21/2013 10:52 06/21/2013 13:28 06/21/2013 21:30 06/22/2013 07:35  Glucose-Capillary Latest Range: 70-99 mg/dL 380 (H) 236 (H) 220 (H) 197 (H)   Diabetes history: DM2 Outpatient Diabetes medications: Januvia (dose not documented) daily Current orders for Inpatient glycemic control: Lantus 10 units QHS, Novolog 0-9 units AC  Inpatient Diabetes Program Recommendations Insulin - Basal: Please consider increasing Lantus to 12 units QHS.  Thanks, Barnie Alderman, RN, MSN, CCRN Diabetes Coordinator Inpatient Diabetes Program [phone] (Team Pager) [phone] (AP office) [phone] [facility] office)

## 2013-06-22 NOTE — Progress Notes (Signed)
Educated patient and daughter on insulin pen use at home. Ordered insulin pen starter kit. Reviewed all steps of insulin pen including attachment of needle, 2-unit air shot, dialing up dose, giving injection, removing needle, disposal of sharps, storage of unused insulin, disposal of insulin etc. Asked patient to provide return demonstration but she states that she feels bad and prefers to practice later.  Informed patient that I have left a test insulin pen and pen needles at the nurses station and she will need to practice with the pen to ensure she feels comfortable giving injections via insulin pen if ordered at discharge.  Discussed basic pathophysiology of DM Type 2, basic home care, importance of checking CBGs and maintaining good CBG control to prevent long-term and short-term complications. Reviewed signs and symptoms of hyperglycemia and hypoglycemia along with treatment for both. Asked that patient check her blood sugar 4 times a day (before meals and at bedtime) and keep a log to take with her to her follow up appointment with Dr. Luan Pulling. Patient states that she is not able to check her blood sugar as freqently as Dr. Luan Pulling as asked in the past because her insurance will only pay for 60 test strips per month. Asked that she let Dr. Luan Pulling know about issue and perhaps call insurance company to let them know she has been asked by her MD to check QID and that she will be new to insulin. Discussed basal and bolus insulin along with onset and duration of each. RNs to provide ongoing basic DM education at bedside with this patient and engage patient to actively check blood glucose and administer insulin injections. Have ordered educational booklet, insulin starter kit, and DM videos.   At time of discharge if patient will be prescribed insulin, MD will need to provide patient Rxs for insulin pens and insulin pen needles.

## 2013-06-22 NOTE — Progress Notes (Signed)
UR chart review completed.  

## 2013-06-22 NOTE — Progress Notes (Signed)
Subjective: She was admitted yesterday with intractable nausea and vomiting and uncontrolled blood sugar. She says she feels better. She still has some nausea. She has been resistant to taking insulin at home but she says she will consider that. She has been significantly stressed by the recent death of her husband from cancer  Objective: Vital signs in last 24 hours: Temp:  [97.1 F (36.2 C)-97.8 F (36.6 C)] 97.1 F (36.2 C) (05/29 0610) Pulse Rate:  [70-103] 70 (05/29 0610) Resp:  [10-25] 20 (05/29 0610) BP: (135-178)/(75-133) 172/77 mmHg (05/29 0610) SpO2:  [91 %-100 %] 98 % (05/29 0610) Weight change:  Last BM Date: 06/20/13  Intake/Output from previous day: 05/28 0701 - 05/29 0700 In: 2687.5 [I.V.:2687.5] Out: 1300 [Urine:1300]  PHYSICAL EXAM General appearance: alert, cooperative and Anxious Resp: clear to auscultation bilaterally Cardio: regular rate and rhythm, S1, S2 normal, no murmur, click, rub or gallop GI: soft, non-tender; bowel sounds normal; no masses,  no organomegaly Extremities: extremities normal, atraumatic, no cyanosis or edema  Lab Results:    Basic Metabolic Panel:  Recent Labs  06/21/13 1044 06/22/13 0220  NA 136* 137  K 3.5* 3.7  CL 95* 101  CO2 22 23  GLUCOSE 360* 221*  BUN 11 9  CREATININE 0.75 0.71  CALCIUM 9.3 8.6   Liver Function Tests:  Recent Labs  06/21/13 1044  AST 20  ALT 18  ALKPHOS 157*  BILITOT 0.5  PROT 8.0  ALBUMIN 3.7   No results found for this basename: LIPASE, AMYLASE,  in the last 72 hours No results found for this basename: AMMONIA,  in the last 72 hours CBC:  Recent Labs  06/21/13 1044 06/22/13 0220  WBC 11.3* 10.2  NEUTROABS 8.5*  --   HGB 12.5 11.4*  HCT 38.8 35.2*  MCV 85.8 86.1  PLT 235 216   Cardiac Enzymes:  Recent Labs  06/21/13 2009 06/22/13 0220 06/22/13 0743  TROPONINI <0.30 <0.30 <0.30   BNP:  Recent Labs  06/21/13 1044  PROBNP 204.0*   D-Dimer: No results found for  this basename: DDIMER,  in the last 72 hours CBG:  Recent Labs  06/21/13 1052 06/21/13 1328 06/21/13 2130 06/22/13 0735  GLUCAP 380* 236* 220* 197*   Hemoglobin A1C: No results found for this basename: HGBA1C,  in the last 72 hours Fasting Lipid Panel: No results found for this basename: CHOL, HDL, LDLCALC, TRIG, CHOLHDL, LDLDIRECT,  in the last 72 hours Thyroid Function Tests: No results found for this basename: TSH, T4TOTAL, FREET4, T3FREE, THYROIDAB,  in the last 72 hours Anemia Panel: No results found for this basename: VITAMINB12, FOLATE, FERRITIN, TIBC, IRON, RETICCTPCT,  in the last 72 hours Coagulation: No results found for this basename: LABPROT, INR,  in the last 72 hours Urine Drug Screen: Drugs of Abuse  No results found for this basename: labopia, cocainscrnur, labbenz, amphetmu, thcu, labbarb    Alcohol Level: No results found for this basename: ETH,  in the last 72 hours Urinalysis:  Recent Labs  06/21/13 1426  COLORURINE YELLOW  LABSPEC 1.020  PHURINE 7.0  GLUCOSEU 250*  HGBUR NEGATIVE  BILIRUBINUR NEGATIVE  KETONESUR NEGATIVE  PROTEINUR NEGATIVE  UROBILINOGEN 0.2  NITRITE NEGATIVE  [place]. Labs:  ABGS No results found for this basename: PHART, PCO2, PO2ART, TCO2, HCO3,  in the last 72 hours CULTURES No results found for this or any previous visit (from the past 240 hour(s)). Studies/Results: Dg Abd Acute W/chest  06/21/2013  CLINICAL DATA:  Vomiting.  Elevated blood sugar.  EXAM: ACUTE ABDOMEN SERIES (ABDOMEN 2 VIEW & CHEST 1 VIEW)  COMPARISON:  Chest x-ray 10/07/2011.  FINDINGS: Mediastinum and hilar structures normal. Stable cardiomegaly. No pulmonary venous congestion noted on today's exam. No pleural effusion or pneumothorax. No acute osseous abnormality.  Abdominal soft tissues unremarkable. The gas pattern is nonspecific. Prior lumbar spine fusion. Calcifications within the pelvis consistent with phleboliths.   IMPRESSION: 1. Chest x-ray reveals mild cardiomegaly, no evidence of overt congestive heart failure. Previously identified pulmonary venous congestion on chest x-ray of 10/07/2011 no longer present. 2. Abdomen is nonfocal.  Prior lumbar spine fusion.   Electronically Signed   By: Marcello Moores  Register   On: 06/21/2013 13:46    Medications:  Prior to Admission:  Prescriptions prior to admission  Medication Sig Dispense Refill  . aspirin 81 MG tablet Take 81 mg by mouth daily.      Marland Kitchen gabapentin (NEURONTIN) 300 MG capsule Take 900 mg by mouth at bedtime.       . Multiple Vitamins-Minerals (MULTIVITAMINS THER. W/MINERALS) TABS tablet Take 1 tablet by mouth daily.      . pravastatin (PRAVACHOL) 40 MG tablet Take 40 mg by mouth at bedtime.       . Probiotic Product (PROBIOTIC DAILY PO) Take 1 capsule by mouth at bedtime.      . SitaGLIPtin Phosphate (JANUVIA PO) Take 1 tablet by mouth daily.      . verapamil (CALAN-SR) 240 MG CR tablet Take 240 mg by mouth daily with breakfast.        Scheduled: . antiseptic oral rinse  15 mL Mouth Rinse BID  . aspirin  81 mg Oral Daily  . atorvastatin  10 mg Oral q1800  . gabapentin  900 mg Oral QHS  . heparin  5,000 Units Subcutaneous 3 times per day  . insulin aspart  0-9 Units Subcutaneous TID WC  . insulin glargine  10 Units Subcutaneous QHS  . multivitamin with minerals  1 tablet Oral Daily  . pantoprazole (PROTONIX) IV  40 mg Intravenous Q12H  . sodium chloride  3 mL Intravenous Q12H  . verapamil  240 mg Oral Q breakfast   Continuous: . 0.9 % NaCl with KCl 20 mEq / L 125 mL/hr at 06/22/13 0423   QBH:[id number], acetaminophen, albuterol, alum & mag hydroxide-simeth, guaiFENesin-dextromethorphan, hydrALAZINE, ondansetron (ZOFRAN) IV, ondansetron, promethazine  Assesment: She has uncontrolled diabetes. She's had nausea and vomiting which have been intractable. She may have gastroparesis. It is also possible that she has gallbladder problems. She has been  dehydrated and is receiving fluid resuscitation. She has significant problems with anxiety Active Problems:   DIABETES   Vomiting   HTN (hypertension)   Dyslipidemia    Plan: She's going to have ultrasound of the abdomen. She will have a gastric emptying study. She will continue her other treatments. I'm going to have her learn how to give herself insulin shots.    LOS: 1 day   Alonza Bogus 06/22/2013, 9:10 AM

## 2013-06-22 NOTE — Consult Note (Signed)
Referring Provider: Alonza Bogus, MD Primary Care Physician:  Alonza Bogus, MD Primary Gastroenterologist:  Dr. Hildred Laser   Reason for Consultation:  Intractable N/V  HPI: Lori Mahoney is a 68 y.o. female admitted with intractable N/V. We were asked to see patient by Dr. Oren Binet, covering for Dr. Luan Pulling. Patient's primary GI is Dr. Laural Golden who is currently out-of-town.  She has history of N/V with extensive work up in 2013. States she had been doing well until about one month ago. Notes significant stress related to husband's death back in April 16, 2013. Diagnosed with lung cancer and died within 12 weeks. She has had very poor appetite. Forces herself to eat once daily. One month ago, had episode of N/V that lasted for about 12 hours. Current symptoms started on Tuesday. She had no antiemetics at home. She was unable to eat/drink for 48 hours so daughter brought her to ER yesterday.   She denies abdominal pain. BMs regular on probiotic. No melena. Rare bright red blood per rectum felt to be due to a hemorrhoid. Denies any ill contacts. Recently her glipizide was stopped and she was started on Januvia (3 weeks ago). Complains of increased belching. No heartburn, dysphagia. No PPI. Denies any fever, dysuria, cough, shortness of breath. Last episode of vomiting was this morning at around 8:30. She is scheduled for abdominal ultrasound and gastric emptying study.  2012, normal gastric emptying study. 2012, gallbladder ejection fraction of 69.7%. Patient had some pain and bloating during [facility] infusion. Dr. Arnoldo Morale did not feel she did have her gallbladder removed. 2012, abdominal ultrasound showed fatty liver. EGD February 2012 Dr. Laural Golden was normal.   Prior to Admission medications   Medication Sig Start Date End Date Taking? Authorizing Provider  aspirin 81 MG tablet Take 81 mg by mouth daily.   Yes Historical Provider, MD  gabapentin (NEURONTIN) 300 MG capsule Take 900 mg by mouth  at bedtime.  09/26/10  Yes Historical Provider, MD  Multiple Vitamins-Minerals (MULTIVITAMINS THER. W/MINERALS) TABS tablet Take 1 tablet by mouth daily.   Yes Historical Provider, MD  pravastatin (PRAVACHOL) 40 MG tablet Take 40 mg by mouth at bedtime.    Yes Historical Provider, MD  Probiotic Product (PROBIOTIC DAILY PO) Take 1 capsule by mouth at bedtime.   Yes Historical Provider, MD  SitaGLIPtin Phosphate (JANUVIA PO) Take 1 tablet by mouth daily.   Yes Historical Provider, MD  verapamil (CALAN-SR) 240 MG CR tablet Take 240 mg by mouth daily with breakfast.  09/23/10  Yes Historical Provider, MD    Current Facility-Administered Medications  Medication Dose Route Frequency Provider Last Rate Last Dose  . 0.9 % NaCl with KCl 20 mEq/ L  infusion   Intravenous Continuous Jonetta Osgood, MD 125 mL/hr at 06/22/13 0423    . acetaminophen (TYLENOL) tablet 650 mg  650 mg Oral Q6H PRN Jonetta Osgood, MD   650 mg at 06/21/13 1813   Or  . acetaminophen (TYLENOL) suppository 650 mg  650 mg Rectal Q6H PRN Shanker Kristeen Mans, MD      . albuterol (PROVENTIL) (2.5 MG/3ML) 0.083% nebulizer solution 2.5 mg  2.5 mg Nebulization Q2H PRN Shanker Kristeen Mans, MD      . alum & mag hydroxide-simeth (MAALOX/MYLANTA) 200-200-20 MG/5ML suspension 30 mL  30 mL Oral Q6H PRN Shanker Kristeen Mans, MD      . antiseptic oral rinse (BIOTENE) solution 15 mL  15 mL Mouth Rinse BID Alonza Bogus, MD   15  mL at 06/22/13 0800  . aspirin chewable tablet 81 mg  81 mg Oral Daily Shanker Kristeen Mans, MD      . atorvastatin (LIPITOR) tablet 10 mg  10 mg Oral q1800 Jonetta Osgood, MD      . gabapentin (NEURONTIN) capsule 900 mg  900 mg Oral QHS Shanker Kristeen Mans, MD      . guaiFENesin-dextromethorphan (ROBITUSSIN DM) 100-10 MG/5ML syrup 5 mL  5 mL Oral Q4H PRN Shanker Kristeen Mans, MD      . heparin injection 5,000 Units  5,000 Units Subcutaneous 3 times per day Jonetta Osgood, MD   5,000 Units at 06/22/13 4034  . hydrALAZINE  (APRESOLINE) injection 10 mg  10 mg Intravenous Q6H PRN Jonetta Osgood, MD      . insulin aspart (novoLOG) injection 0-9 Units  0-9 Units Subcutaneous TID WC Jonetta Osgood, MD   2 Units at 06/22/13 0827  . insulin glargine (LANTUS) injection 12 Units  12 Units Subcutaneous QHS Alonza Bogus, MD      . multivitamin with minerals tablet 1 tablet  1 tablet Oral Daily Jonetta Osgood, MD   1 tablet at 06/22/13 [phone]  . ondansetron (ZOFRAN) tablet 4 mg  4 mg Oral Q6H PRN Jonetta Osgood, MD       Or  . ondansetron [facility]) injection 4 mg  4 mg Intravenous Q6H PRN Jonetta Osgood, MD   4 mg at 06/22/13 0936  . pantoprazole (PROTONIX) injection 40 mg  40 mg Intravenous Q12H Jonetta Osgood, MD   40 mg at 06/22/13 0829  . promethazine (PHENERGAN) injection 12.5 mg  12.5 mg Intravenous Q6H PRN Shanker Kristeen Mans, MD      . sodium chloride 0.9 % injection 3 mL  3 mL Intravenous Q12H Shanker Kristeen Mans, MD      . technetium sulfur colloid (NYCOMED-Dalhart) injection solution 2 milli Curie  2 milli Curie Intravenous Once PRN Medication Radiologist, MD      . verapamil (CALAN-SR) CR tablet 240 mg  240 mg Oral Q breakfast Jonetta Osgood, MD   240 mg at 06/22/13 9563    Allergies as of 06/21/2013 - Review Complete 06/21/2013  Allergen Reaction Noted  . Codeine Nausea And Vomiting   . Hydrocodone-acetaminophen Nausea And Vomiting 01/06/2009  . Meperidine hcl Nausea And Vomiting   . Contrast media [iodinated diagnostic agents] Nausea And Vomiting 09/02/2011  . Oxycodone Nausea And Vomiting 09/02/2011  . Tetracyclines & related Rash 06/21/2013    Past Medical History  Diagnosis Date  . Diabetes mellitus   . Hypertension   . Neuropathy   . GERD (gastroesophageal reflux disease)   . Complication of anesthesia   . PONV (postoperative nausea and vomiting)   . H/O exercise stress test     good result- 2003.  Also had Echocardiogram 10 yrs. ago, told that valve was thickening, but no need for  changes , no need for f/u  . Shortness of breath   . H/O hiatal hernia   . Neuromuscular disorder     neuropathy- legs & feet   . Arthritis     hands, hips, back   . Anemia     borderline anemia   . Cancer   . Stroke     Past Surgical History  Procedure Laterality Date  . Knee surgery    . Abdominal hysterectomy    . Back surgery  02/2009    L3-S1 fusion  . Colonoscopy    .  Upper gastrointestinal endoscopy    . Joint replacement  2005    left  . Eye surgery      cataracts removed- R eye    . 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 Arthritis    . Cancer Brother     prostate  . Colon cancer Mother     age 71  . Breast cancer Sister   . Diabetes Brother     History   Social History  . Marital Status: Married    Spouse Name: N/A    Number of Children: N/A  . Years of Education: N/A   Occupational History  . retired    Social History Main Topics  . Smoking status: Never Smoker   . Smokeless tobacco: Never Used  . Alcohol Use: No  . Drug Use: No  . Sexual Activity: Not Currently   Other Topics Concern  . Not on file   Social History Narrative  . No narrative on file     ROS:  General: see hpi Eyes: Negative for vision changes.  ENT: Negative for hoarseness, difficulty swallowing , nasal congestion. CV: Negative for chest pain, angina, palpitations, dyspnea on exertion, peripheral edema.  Respiratory: Negative for dyspnea at rest, dyspnea on exertion, cough, sputum, wheezing.  GI: See history of present illness. GU:  Negative for dysuria, hematuria, urinary incontinence, urinary frequency, nocturnal urination.  MS: Negative for joint pain. chronic low back pain.  Derm: Negative for rash or itching.  Neuro: Negative for weakness, abnormal sensation, seizure, frequent headaches, memory loss, confusion.  Psych: Negative for anxiety,  suicidal ideation, hallucinations. +depression Endo:  Negative for unusual weight change.  Heme: Negative for bruising or bleeding. Allergy: Negative for rash or hives.       Physical Examination: Vital signs in last 24 hours: Temp:  [97.1 F (36.2 C)-97.8 F (36.6 C)] 97.1 F (36.2 C) (05/29 0610) Pulse Rate:  [70-103] 70 (05/29 0610) Resp:  [12-21] 20 (05/29 0610) BP: (135-178)/(75-133) 172/77 mmHg (05/29 0610) SpO2:  [91 %-100 %] 98 % (05/29 0610) Last BM Date: 06/20/13  General: Well-nourished, well-developed in no acute distress. Accompanied by daughter. Head: Normocephalic, atraumatic.   Eyes: Conjunctiva pink, no icterus. Mouth: Oropharyngeal mucosa moist and pink , no lesions erythema or exudate. Neck: Supple without thyromegaly, masses, or lymphadenopathy.  Lungs: Clear to auscultation bilaterally.  Heart: Regular rate and rhythm, no murmurs rubs or gallops.  Abdomen: Bowel sounds are normal, nontender, nondistended, no hepatosplenomegaly or masses, no abdominal bruits or    hernia , no rebound or guarding.   Rectal: not performed Extremities: No lower extremity edema, clubbing, deformity.  Neuro: Alert and oriented x 4 , grossly normal neurologically.  Skin: Warm and dry, no rash or jaundice.   Psych: Alert and cooperative, normal mood and affect.        Intake/Output from previous day: 05/28 0701 - 05/29 0700 In: 2687.5 [I.V.:2687.5] Out: 1300 [Urine:1300] Intake/Output this shift:    Lab Results: CBC  Recent Labs  06/21/13 1044 06/22/13 0220  WBC 11.3* 10.2  HGB 12.5 11.4*  HCT 38.8 35.2*  MCV 85.8 86.1  PLT 235 216   BMET  Recent Labs  06/21/13 1044 06/22/13 0220  NA 136* 137  K 3.5* 3.7  CL 95* 101  CO2 22 23  GLUCOSE 360* 221*  BUN 11 9  CREATININE 0.75 0.71  CALCIUM 9.3 8.6   LFT  Recent Labs  06/21/13 1044  BILITOT 0.5  ALKPHOS 157*  AST 20  ALT 18  PROT 8.0  ALBUMIN 3.7    Lipase No results found for this basename: LIPASE,  in the last 72 hours  PT/INR No results found  for this basename: LABPROT, INR,  in the last 72 hours    Imaging Studies: Dg Abd Acute W/chest  06/21/2013   CLINICAL DATA:  Vomiting.  Elevated blood sugar.  EXAM: ACUTE ABDOMEN SERIES (ABDOMEN 2 VIEW & CHEST 1 VIEW)  COMPARISON:  Chest x-ray 10/07/2011.  FINDINGS: Mediastinum and hilar structures normal. Stable cardiomegaly. No pulmonary venous congestion noted on today's exam. No pleural effusion or pneumothorax. No acute osseous abnormality.  Abdominal soft tissues unremarkable. The gas pattern is nonspecific. Prior lumbar spine fusion. Calcifications within the pelvis consistent with phleboliths.  IMPRESSION: 1. Chest x-ray reveals mild cardiomegaly, no evidence of overt congestive heart failure. Previously identified pulmonary venous congestion on chest x-ray of 10/07/2011 no longer present. 2. Abdomen is nonfocal.  Prior lumbar spine fusion.   Electronically Signed   By: Marcello Moores  Register   On: 06/21/2013 13:46  [4 week]   Impression: 68 year old lady who presents with two-day history of intractable nausea and vomiting. Workup in 2012 for similar symptoms as noted above. Suspected gastroparesis previously. Gallbladder remains in situ. She's had no abdominal pain or heartburn. Significant stress related to recent death of her husband which was unexpected. Symptoms may be situational related to stress. May be flare of her gastroparesis.  Plan: 1. Will followup abdominal ultrasound and a gastric emptying study as scheduled. 2. Supportive measures. Scheduled Zofran planned. Consider adding Reglan after gastric emptying study. 3. Agree with PPI therapy  We would like to thank you for the opportunity to participate in the care of Lori Mahoney.  Attending note:  Patient seen and examined. Blood sugars have been relatively poorly controlled recently. Hepatobiliary tree okay on ultrasound. Gastric emptying delayed on nuclear medicine study. 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 Discussed the risks and benefits of a short course of low-dose Reglan with patient and family members. Discussed the potential side effects of tardive dyskinesia/extraparametal side effects. They are agreeable to a therapeutic trial. We'll add low dose IV Reglan to her regimen. Also, recommend a gastroparesis diet. Obviously, better glycemic control will be helpful. Dr. Laural Golden will look in on her on June 1.   There will be no inpatient GI coverage from Friday at 5:00 pm until Monday at 7:30 am.      LOS: 1 day   Mahala Menghini  06/22/2013, 11:55 AM

## 2013-06-23 LAB — GLUCOSE, CAPILLARY
GLUCOSE-CAPILLARY: 217 mg/dL — AB (ref 70–99)
Glucose-Capillary: 235 mg/dL — ABNORMAL HIGH (ref 70–99)
Glucose-Capillary: 201 mg/dL — ABNORMAL HIGH (ref 70–99)
Glucose-Capillary: 178 mg/dL — ABNORMAL HIGH (ref 70–99)

## 2013-06-23 LAB — BASIC METABOLIC PANEL
BUN: 9 mg/dL (ref 6–23)
CALCIUM: 8.9 mg/dL (ref 8.4–10.5)
CO2: 23 mEq/L (ref 19–32)
Chloride: 103 mEq/L (ref 96–112)
Creatinine, Ser: 0.86 mg/dL (ref 0.50–1.10)
GFR calc non Af Amer: 67 mL/min — ABNORMAL LOW (ref >90)
GFR, EST AFRICAN AMERICAN: 78 mL/min — AB (ref >90)
GLUCOSE: 212 mg/dL — AB (ref 70–99)
Potassium: 3.5 mEq/L — ABNORMAL LOW (ref 3.7–5.3)
Sodium: 138 mEq/L (ref 137–147)

## 2013-06-23 MED ORDER — PANTOPRAZOLE SODIUM 40 MG PO TBEC
40.0000 mg | DELAYED_RELEASE_TABLET | Freq: Two times a day (BID) | ORAL | Status: DC
  Administered 2013-06-23 – 2013-06-24 (×2): 40 mg via ORAL
  Filled 2013-06-23 (×2): qty 1

## 2013-06-23 MED ORDER — POTASSIUM CHLORIDE CRYS ER 20 MEQ PO TBCR
40.0000 meq | EXTENDED_RELEASE_TABLET | Freq: Once | ORAL | Status: AC
  Administered 2013-06-23: 40 meq via ORAL
  Filled 2013-06-23: qty 2

## 2013-06-23 MED ORDER — INSULIN DETEMIR 100 UNIT/ML ~~LOC~~ SOLN
12.0000 [IU] | Freq: Every day | SUBCUTANEOUS | Status: DC
  Administered 2013-06-23 – 2013-06-24 (×2): 12 [IU] via SUBCUTANEOUS
  Filled 2013-06-23 (×3): qty 0.12

## 2013-06-23 NOTE — Progress Notes (Signed)
Subjective: She feels much better. She wants to eat. She is not nauseated.  Objective: Vital signs in last 24 hours: Temp:  [97.6 F (36.4 C)-98.4 F (36.9 C)] 98.4 F (36.9 C) (05/30 0439) Pulse Rate:  [61-66] 63 (05/30 0439) Resp:  [20] 20 (05/30 0439) BP: (136-158)/(60-75) 157/75 mmHg (05/30 0439) SpO2:  [93 %-97 %] 93 % (05/30 0439) Weight change:  Last BM Date: 06/20/13  Intake/Output from previous day: 05/29 0701 - 05/30 0700 In: 3308.8 [P.O.:240; I.V.:3068.8] Out: 1000 [Urine:1000]  PHYSICAL EXAM General appearance: alert, cooperative and no distress Resp: clear to auscultation bilaterally Cardio: regular rate and rhythm, S1, S2 normal, no murmur, click, rub or gallop GI: soft, non-tender; bowel sounds normal; no masses,  no organomegaly Extremities: extremities normal, atraumatic, no cyanosis or edema  Lab Results:    Basic Metabolic Panel:  Recent Labs  06/22/13 0220 06/23/13 0647  NA 137 138  K 3.7 3.5*  CL 101 103  CO2 23 23  GLUCOSE 221* 212*  BUN 9 9  CREATININE 0.71 0.86  CALCIUM 8.6 8.9   Liver Function Tests:  Recent Labs  06/21/13 1044  AST 20  ALT 18  ALKPHOS 157*  BILITOT 0.5  PROT 8.0  ALBUMIN 3.7   No results found for this basename: LIPASE, AMYLASE,  in the last 72 hours No results found for this basename: AMMONIA,  in the last 72 hours CBC:  Recent Labs  06/21/13 1044 06/22/13 0220  WBC 11.3* 10.2  NEUTROABS 8.5*  --   HGB 12.5 11.4*  HCT 38.8 35.2*  MCV 85.8 86.1  PLT 235 216   Cardiac Enzymes:  Recent Labs  06/21/13 2009 06/22/13 0220 06/22/13 0743  TROPONINI <0.30 <0.30 <0.30   BNP:  Recent Labs  06/21/13 1044  PROBNP 204.0*   D-Dimer: No results found for this basename: DDIMER,  in the last 72 hours CBG:  Recent Labs  06/21/13 2130 06/22/13 0735 06/22/13 1331 06/22/13 1708 06/22/13 2031 06/23/13 0750  GLUCAP 220* 197* 265* 223* 210* 217*   Hemoglobin A1C:  Recent Labs  06/22/13 1500   HGBA1C 10.3*   Fasting Lipid Panel: No results found for this basename: CHOL, HDL, LDLCALC, TRIG, CHOLHDL, LDLDIRECT,  in the last 72 hours Thyroid Function Tests: No results found for this basename: TSH, T4TOTAL, FREET4, T3FREE, THYROIDAB,  in the last 72 hours Anemia Panel: No results found for this basename: VITAMINB12, FOLATE, FERRITIN, TIBC, IRON, RETICCTPCT,  in the last 72 hours Coagulation: No results found for this basename: LABPROT, INR,  in the last 72 hours Urine Drug Screen: Drugs of Abuse  No results found for this basename: labopia, cocainscrnur, labbenz, amphetmu, thcu, labbarb    Alcohol Level: No results found for this basename: ETH,  in the last 72 hours Urinalysis:  Recent Labs  06/21/13 1426  COLORURINE YELLOW  LABSPEC 1.020  PHURINE 7.0  GLUCOSEU 250*  HGBUR NEGATIVE  BILIRUBINUR NEGATIVE  KETONESUR NEGATIVE  PROTEINUR NEGATIVE  UROBILINOGEN 0.2  NITRITE NEGATIVE  [place]. Labs:  ABGS No results found for this basename: PHART, PCO2, PO2ART, TCO2, HCO3,  in the last 72 hours CULTURES No results found for this or any previous visit (from the past 240 hour(s)). Studies/Results: Nm Gastric Emptying  06/22/2013   CLINICAL DATA:  Gastroparesis  EXAM: NUCLEAR MEDICINE GASTRIC EMPTYING SCAN  TECHNIQUE: After oral ingestion of radiolabeled meal, sequential abdominal images were obtained for 120 minutes. Residual percentage of activity remaining within the stomach  was calculated at 60 and 120 minutes.  RADIOPHARMACEUTICALS:  2.0 Technetium 99-m labeled sulfur colloid  COMPARISON:  None.  FINDINGS: Expected location of the stomach in the left upper quadrant. Ingested meal empties the stomach gradually over the course of the study with 100% retention at 60 min and 50% retention at 120 min (normal retention less than 30% at a 120 min).  IMPRESSION: Gastric retention at 2 hr is 50% with normal as less than 30%. This is compatible with delayed  gastric emptying.   Electronically Signed   By: Maryclare Bean M.D.   On: 06/22/2013 12:41   US Abdomen Complete  06/22/2013   CLINICAL DATA:  Nausea and vomiting.  EXAM: ULTRASOUND ABDOMEN COMPLETE  COMPARISON:  Gastric emptying study Jun 22, 2013 and acute abdominal series Jun 21, 2013 and abdominal ultrasound of January 26, 2010.  FINDINGS: Gallbladder:  No gallstones or wall thickening visualized. No sonographic Murphy sign noted.  Common bile duct:  Diameter: 7 mm, normal for age.  Liver:  No focal lesion identified. Within normal limits in parenchymal echogenicity.  Hepatopetal portal vein.  IVC:  No abnormality visualized.  Pancreas:  Visualized portion unremarkable.  Spleen:  Size and appearance within normal limits.  Right Kidney:  Length: 12.5 cm. Echogenicity within normal limits. No mass or hydronephrosis visualized. Similar lobulations.  Left Kidney:  Length: 12.7 cm. Echogenicity within normal limits. No mass or hydronephrosis visualized. Similar lobulations.  Abdominal aorta:  No aneurysm visualized.  Other findings:  Multiple loops of gas distended small bowel.  IMPRESSION: Similar renal lobulations are nonspecific and could reflect cortical scarring or normal variant with otherwise unremarkable abdominal ultrasound.   Electronically Signed   By: Elon Alas   On: 06/22/2013 13:31   Dg Abd Acute W/chest  06/21/2013   CLINICAL DATA:  Vomiting.  Elevated blood sugar.  EXAM: ACUTE ABDOMEN SERIES (ABDOMEN 2 VIEW & CHEST 1 VIEW)  COMPARISON:  Chest x-ray 10/07/2011.  FINDINGS: Mediastinum and hilar structures normal. Stable cardiomegaly. No pulmonary venous congestion noted on today's exam. No pleural effusion or pneumothorax. No acute osseous abnormality.  Abdominal soft tissues unremarkable. The gas pattern is nonspecific. Prior lumbar spine fusion. Calcifications within the pelvis consistent with phleboliths.  IMPRESSION: 1. Chest x-ray reveals mild cardiomegaly, no evidence of overt congestive  heart failure. Previously identified pulmonary venous congestion on chest x-ray of 10/07/2011 no longer present. 2. Abdomen is nonfocal.  Prior lumbar spine fusion.   Electronically Signed   By: Marcello Moores  Register   On: 06/21/2013 13:46    Medications:  Prior to Admission:  Prescriptions prior to admission  Medication Sig Dispense Refill  . aspirin 81 MG tablet Take 81 mg by mouth daily.      Marland Kitchen gabapentin (NEURONTIN) 300 MG capsule Take 900 mg by mouth at bedtime.       . Multiple Vitamins-Minerals (MULTIVITAMINS THER. W/MINERALS) TABS tablet Take 1 tablet by mouth daily.      . pravastatin (PRAVACHOL) 40 MG tablet Take 40 mg by mouth at bedtime.       . Probiotic Product (PROBIOTIC DAILY PO) Take 1 capsule by mouth at bedtime.      . SitaGLIPtin Phosphate (JANUVIA PO) Take 1 tablet by mouth daily.      . verapamil (CALAN-SR) 240 MG CR tablet Take 240 mg by mouth daily with breakfast.        Scheduled: . antiseptic oral rinse  15 mL Mouth Rinse BID  . aspirin  81  mg Oral Daily  . atorvastatin  10 mg Oral q1800  . gabapentin  900 mg Oral QHS  . heparin  5,000 Units Subcutaneous 3 times per day  . insulin aspart  0-9 Units Subcutaneous TID WC  . insulin detemir  12 Units Subcutaneous Daily  . metoCLOPramide (REGLAN) injection  5 mg Intravenous 3 times per day  . multivitamin with minerals  1 tablet Oral Daily  . pantoprazole (PROTONIX) IV  40 mg Intravenous Q12H  . potassium chloride  40 mEq Oral Once  . sodium chloride  3 mL Intravenous Q12H  . verapamil  240 mg Oral Q breakfast   Continuous:  RVU:[id number], acetaminophen, albuterol, alum & mag hydroxide-simeth, guaiFENesin-dextromethorphan, hydrALAZINE, ondansetron (ZOFRAN) IV, ondansetron, promethazine  Assesment: She came in with nausea and vomiting. She has gastroparesis based on her gastric emptying study. She has diabetes which was not controlled but is doing better Active Problems:   DIABETES   Vomiting   HTN  (hypertension)   Dyslipidemia    Plan: Continue current treatments. Again her diet. She may be able to go home    LOS: 2 days   Alonza Bogus 06/23/2013, 10:03 AM

## 2013-06-23 NOTE — Progress Notes (Signed)
Nutrition Brief Note  Patient identified on the Malnutrition Screening Tool (MST) Report  Wt Readings from Last 15 Encounters:  06/21/13 201 lb (91.173 kg)  02/01/12 199 lb 3.2 oz (90.357 kg)  10/11/11 198 lb (89.812 kg)  10/07/11 206 lb 14.4 oz (93.849 kg)  09/14/11 201 lb 4.5 oz (91.3 kg)  09/14/11 201 lb 4.5 oz (91.3 kg)  09/09/11 201 lb 4.8 oz (91.309 kg)  08/04/11 202 lb (91.627 kg)  12/29/10 214 lb (97.07 kg)  10/20/10 211 lb (95.709 kg)    Body mass index is 35.61 kg/(m^2). Patient meets criteria for obesity class 2 based on current BMI.   Nausea and vomiting improved and diet advanced.Current diet order is CHO Modified , patient is consuming approximately 50% of meals at this time. Labs and medications reviewed.   No nutrition interventions warranted at this time. If nutrition issues arise, please consult RD.   Colman Cater MS,RD,CSG,LDN Office: [phone] Pager: [phone]

## 2013-06-23 NOTE — Progress Notes (Signed)
Went over sliding scale insulin with pt. Pt was able to correctly identify necessary units of insulin to give based on blood sugar levels. Pt was able to draw insulin up into syringe and administer to self in right medial thigh. Pt understands and correctly demonstrates how to administer insulin and the importance of rotating sites. Will continue to monitor.

## 2013-06-23 NOTE — Progress Notes (Signed)
The patient is receiving Protonix by the intravenous route.  Based on criteria approved by the Pharmacy and [place], the medication is being converted to the equivalent oral dose form.  These criteria include: -No Active GI bleeding -Able to tolerate diet of full liquids (or better) or tube feeding OR able to tolerate other medications by the oral or enteral route  If you have any questions about this conversion, please contact the Pharmacy Department (ext [phone]).  Thank you.  Consider also changing Reglan to oral route as appropriate.  Thanks!  [place], [facility] 06/23/2013 10:37 AM

## 2013-06-24 DIAGNOSIS — K3184 Gastroparesis: Secondary | ICD-10-CM

## 2013-06-24 DIAGNOSIS — E1143 Type 2 diabetes mellitus with diabetic autonomic (poly)neuropathy: Secondary | ICD-10-CM | POA: Diagnosis present

## 2013-06-24 LAB — GLUCOSE, CAPILLARY: GLUCOSE-CAPILLARY: 177 mg/dL — AB (ref 70–99)

## 2013-06-24 MED ORDER — INSULIN DETEMIR 100 UNIT/ML ~~LOC~~ SOLN: 12.0000 [IU] | Freq: Every day | SUBCUTANEOUS | Status: DC

## 2013-06-24 NOTE — Plan of Care (Signed)
Problem: Food- and Nutrition-Related Knowledge Deficit (NB-1.1) Goal: Nutrition education Formal process to instruct or train a patient/client in a skill or to impart knowledge to help patients/clients voluntarily manage or modify food choices and eating behavior to maintain or improve health. Outcome: Adequate for Discharge .  Nutrition Note- late entry    Lab Results  Component Value Date    HGBA1C 10.3* 06/22/2013    RD provided "Gastroparesis" handout from the [employer]. Encouraged small frequent meals, chew foods thoroughly and avoid high fat intake. Also reminded pt to drink water with meals and avoid lying down immediately after eating.  She has had DM for some time and was controlling well but says she had let her diet slip after the passing of her husband earlier this year. We reviewed different food groups and their effects on blood sugar, emphasizing carbohydrate-containing foods. Provided list of carbohydrates and recommended serving sizes of common foods.  Discussed importance of controlled and consistent carbohydrate intake throughout the day. Provided examples of ways to balance meals/snacks and encouraged intake of high-fiber, whole grain complex carbohydrates. Teach back method used.  Expect good compliance. Her daughter is a Marine scientist and pt has good family support.  Body mass index is 35.61 kg/(m^2). Pt meets criteria for obesity class 2 based on current BMI.  Current diet order is CHO Modified, patient is consuming approximately 50% of meals at this time. Labs and medications reviewed. No further nutrition interventions warranted at this time. RD contact information provided. If additional nutrition issues arise, please re-consult RD.  Colman Cater MS,RD,CSG,LDN Office: [phone] Pager: [phone]

## 2013-06-24 NOTE — Progress Notes (Signed)
Subjective: She feels much better and wants to go home. She has no new complaints.  Objective: Vital signs in last 24 hours: Temp:  [97.9 F (36.6 C)-98.2 F (36.8 C)] 97.9 F (36.6 C) (05/31 0445) Pulse Rate:  [61-74] 74 (05/31 0445) Resp:  [20] 20 (05/31 0445) BP: (147-152)/(58-72) 152/59 mmHg (05/31 0445) SpO2:  [95 %-98 %] 97 % (05/31 0445) Weight change:  Last BM Date: 06/20/13  Intake/Output from previous day: 05/30 0701 - 05/31 0700 In: 1845.4 [P.O.:660; I.V.:1185.4] Out: 2900 [Urine:2900]  PHYSICAL EXAM General appearance: alert, cooperative, no distress and moderately obese Resp: clear to auscultation bilaterally Cardio: regular rate and rhythm, S1, S2 normal, no murmur, click, rub or gallop GI: soft, non-tender; bowel sounds normal; no masses,  no organomegaly Extremities: extremities normal, atraumatic, no cyanosis or edema  Lab Results:    Basic Metabolic Panel:  Recent Labs  06/22/13 0220 06/23/13 0647  NA 137 138  K 3.7 3.5*  CL 101 103  CO2 23 23  GLUCOSE 221* 212*  BUN 9 9  CREATININE 0.71 0.86  CALCIUM 8.6 8.9   Liver Function Tests:  Recent Labs  06/21/13 1044  AST 20  ALT 18  ALKPHOS 157*  BILITOT 0.5  PROT 8.0  ALBUMIN 3.7   No results found for this basename: LIPASE, AMYLASE,  in the last 72 hours No results found for this basename: AMMONIA,  in the last 72 hours CBC:  Recent Labs  06/21/13 1044 06/22/13 0220  WBC 11.3* 10.2  NEUTROABS 8.5*  --   HGB 12.5 11.4*  HCT 38.8 35.2*  MCV 85.8 86.1  PLT 235 216   Cardiac Enzymes:  Recent Labs  06/21/13 2009 06/22/13 0220 06/22/13 0743  TROPONINI <0.30 <0.30 <0.30   BNP:  Recent Labs  06/21/13 1044  PROBNP 204.0*   D-Dimer: No results found for this basename: DDIMER,  in the last 72 hours CBG:  Recent Labs  06/22/13 2031 06/23/13 0750 06/23/13 1201 06/23/13 1650 06/23/13 2027 06/24/13 0757  GLUCAP 210* 217* 235* 201* 178* 177*   Hemoglobin  A1C:  Recent Labs  06/22/13 1500  HGBA1C 10.3*   Fasting Lipid Panel: No results found for this basename: CHOL, HDL, LDLCALC, TRIG, CHOLHDL, LDLDIRECT,  in the last 72 hours Thyroid Function Tests: No results found for this basename: TSH, T4TOTAL, FREET4, T3FREE, THYROIDAB,  in the last 72 hours Anemia Panel: No results found for this basename: VITAMINB12, FOLATE, FERRITIN, TIBC, IRON, RETICCTPCT,  in the last 72 hours Coagulation: No results found for this basename: LABPROT, INR,  in the last 72 hours Urine Drug Screen: Drugs of Abuse  No results found for this basename: labopia, cocainscrnur, labbenz, amphetmu, thcu, labbarb    Alcohol Level: No results found for this basename: ETH,  in the last 72 hours Urinalysis:  Recent Labs  06/21/13 1426  COLORURINE YELLOW  LABSPEC 1.020  PHURINE 7.0  GLUCOSEU 250*  HGBUR NEGATIVE  BILIRUBINUR NEGATIVE  KETONESUR NEGATIVE  PROTEINUR NEGATIVE  UROBILINOGEN 0.2  NITRITE NEGATIVE  [place]. Labs:  ABGS No results found for this basename: PHART, PCO2, PO2ART, TCO2, HCO3,  in the last 72 hours CULTURES No results found for this or any previous visit (from the past 240 hour(s)). Studies/Results: Nm Gastric Emptying  06/22/2013   CLINICAL DATA:  Gastroparesis  EXAM: NUCLEAR MEDICINE GASTRIC EMPTYING SCAN  TECHNIQUE: After oral ingestion of radiolabeled meal, sequential abdominal images were obtained for 120 minutes. Residual percentage of activity  remaining within the stomach was calculated at 60 and 120 minutes.  RADIOPHARMACEUTICALS:  2.0 Technetium 99-m labeled sulfur colloid  COMPARISON:  None.  FINDINGS: Expected location of the stomach in the left upper quadrant. Ingested meal empties the stomach gradually over the course of the study with 100% retention at 60 min and 50% retention at 120 min (normal retention less than 30% at a 120 min).  IMPRESSION: Gastric retention at 2 hr is 50% with normal as less than  30%. This is compatible with delayed gastric emptying.   Electronically Signed   By: Maryclare Bean M.D.   On: 06/22/2013 12:41   US Abdomen Complete  06/22/2013   CLINICAL DATA:  Nausea and vomiting.  EXAM: ULTRASOUND ABDOMEN COMPLETE  COMPARISON:  Gastric emptying study Jun 22, 2013 and acute abdominal series Jun 21, 2013 and abdominal ultrasound of January 26, 2010.  FINDINGS: Gallbladder:  No gallstones or wall thickening visualized. No sonographic Murphy sign noted.  Common bile duct:  Diameter: 7 mm, normal for age.  Liver:  No focal lesion identified. Within normal limits in parenchymal echogenicity.  Hepatopetal portal vein.  IVC:  No abnormality visualized.  Pancreas:  Visualized portion unremarkable.  Spleen:  Size and appearance within normal limits.  Right Kidney:  Length: 12.5 cm. Echogenicity within normal limits. No mass or hydronephrosis visualized. Similar lobulations.  Left Kidney:  Length: 12.7 cm. Echogenicity within normal limits. No mass or hydronephrosis visualized. Similar lobulations.  Abdominal aorta:  No aneurysm visualized.  Other findings:  Multiple loops of gas distended small bowel.  IMPRESSION: Similar renal lobulations are nonspecific and could reflect cortical scarring or normal variant with otherwise unremarkable abdominal ultrasound.   Electronically Signed   By: Elon Alas   On: 06/22/2013 13:31    Medications:  Prior to Admission:  Prescriptions prior to admission  Medication Sig Dispense Refill  . aspirin 81 MG tablet Take 81 mg by mouth daily.      Marland Kitchen gabapentin (NEURONTIN) 300 MG capsule Take 900 mg by mouth at bedtime.       . Multiple Vitamins-Minerals (MULTIVITAMINS THER. W/MINERALS) TABS tablet Take 1 tablet by mouth daily.      . pravastatin (PRAVACHOL) 40 MG tablet Take 40 mg by mouth at bedtime.       . Probiotic Product (PROBIOTIC DAILY PO) Take 1 capsule by mouth at bedtime.      . SitaGLIPtin Phosphate (JANUVIA PO) Take 1 tablet by mouth daily.       . verapamil (CALAN-SR) 240 MG CR tablet Take 240 mg by mouth daily with breakfast.        Scheduled: . antiseptic oral rinse  15 mL Mouth Rinse BID  . aspirin  81 mg Oral Daily  . atorvastatin  10 mg Oral q1800  . gabapentin  900 mg Oral QHS  . heparin  5,000 Units Subcutaneous 3 times per day  . insulin aspart  0-9 Units Subcutaneous TID WC  . insulin detemir  12 Units Subcutaneous Daily  . metoCLOPramide (REGLAN) injection  5 mg Intravenous 3 times per day  . multivitamin with minerals  1 tablet Oral Daily  . pantoprazole  40 mg Oral BID AC  . sodium chloride  3 mL Intravenous Q12H  . verapamil  240 mg Oral Q breakfast   Continuous:  TDV:[id number], acetaminophen, albuterol, alum & mag hydroxide-simeth, guaiFENesin-dextromethorphan, hydrALAZINE, ondansetron (ZOFRAN) IV, ondansetron, promethazine  Assesment: She was admitted with intractable nausea and vomiting and that has improved.  She has diabetic gastroparesis. Her blood sugar is better. She has learned to give herself insulin shots. She is ready for discharge Active Problems:   DIABETES   Vomiting   HTN (hypertension)   Dyslipidemia    Plan: She will be discharged home today. Please see discharge summary for details    LOS: 3 days   Lori Mahoney 06/24/2013, 8:46 AM

## 2013-06-24 NOTE — Progress Notes (Signed)
Pt is to be discharged home today. Pt is in NAD, IV is out, all paperwork has been reviewed/discussed with patient, and there are no questions/concerns at this time. Assessment is unchanged from this morning. Pt is to be accompanied downstairs by staff and family via wheelchair.  

## 2013-06-25 NOTE — Discharge Summary (Signed)
Physician Discharge Summary  Patient ID: Lori Mahoney MRN: [id number] DOB/AGE: 1945/07/02 68 y.o. Primary Care Physician:Christin Moline L, MD Admit date: 06/21/2013 Discharge date: 06/25/2013    Discharge Diagnoses:   Active Problems:   DIABETES   Vomiting   HTN (hypertension)   Dyslipidemia   Gastroparesis diabeticorum  anxiety   Medication List         aspirin 81 MG tablet  Take 81 mg by mouth daily.     gabapentin 300 MG capsule  Commonly known as:  NEURONTIN  Take 900 mg by mouth at bedtime.     insulin detemir 100 UNIT/ML injection  Commonly known as:  LEVEMIR  Inject 0.12 mLs (12 Units total) into the skin daily.     JANUVIA PO  Take 1 tablet by mouth daily.     multivitamins ther. w/minerals Tabs tablet  Take 1 tablet by mouth daily.     pravastatin 40 MG tablet  Commonly known as:  PRAVACHOL  Take 40 mg by mouth at bedtime.     PROBIOTIC DAILY PO  Take 1 capsule by mouth at bedtime.     verapamil 240 MG CR tablet  Commonly known as:  CALAN-SR  Take 240 mg by mouth daily with breakfast.        Discharged Condition: Improved    Consults: GI  Significant Diagnostic Studies: Nm Gastric Emptying  06/22/2013   CLINICAL DATA:  Gastroparesis  EXAM: NUCLEAR MEDICINE GASTRIC EMPTYING SCAN  TECHNIQUE: After oral ingestion of radiolabeled meal, sequential abdominal images were obtained for 120 minutes. Residual percentage of activity remaining within the stomach was calculated at 60 and 120 minutes.  RADIOPHARMACEUTICALS:  2.0 Technetium 99-m labeled sulfur colloid  COMPARISON:  None.  FINDINGS: Expected location of the stomach in the left upper quadrant. Ingested meal empties the stomach gradually over the course of the study with 100% retention at 60 min and 50% retention at 120 min (normal retention less than 30% at a 120 min).  IMPRESSION: Gastric retention at 2 hr is 50% with normal as less than 30%. This is compatible with delayed gastric emptying.    Electronically Signed   By: Maryclare Bean M.D.   On: 06/22/2013 12:41   US Abdomen Complete  06/22/2013   CLINICAL DATA:  Nausea and vomiting.  EXAM: ULTRASOUND ABDOMEN COMPLETE  COMPARISON:  Gastric emptying study Jun 22, 2013 and acute abdominal series Jun 21, 2013 and abdominal ultrasound of January 26, 2010.  FINDINGS: Gallbladder:  No gallstones or wall thickening visualized. No sonographic Murphy sign noted.  Common bile duct:  Diameter: 7 mm, normal for age.  Liver:  No focal lesion identified. Within normal limits in parenchymal echogenicity.  Hepatopetal portal vein.  IVC:  No abnormality visualized.  Pancreas:  Visualized portion unremarkable.  Spleen:  Size and appearance within normal limits.  Right Kidney:  Length: 12.5 cm. Echogenicity within normal limits. No mass or hydronephrosis visualized. Similar lobulations.  Left Kidney:  Length: 12.7 cm. Echogenicity within normal limits. No mass or hydronephrosis visualized. Similar lobulations.  Abdominal aorta:  No aneurysm visualized.  Other findings:  Multiple loops of gas distended small bowel.  IMPRESSION: Similar renal lobulations are nonspecific and could reflect cortical scarring or normal variant with otherwise unremarkable abdominal ultrasound.   Electronically Signed   By: Elon Alas   On: 06/22/2013 13:31   Dg Abd Acute W/chest  06/21/2013   CLINICAL DATA:  Vomiting.  Elevated blood sugar.  EXAM: ACUTE ABDOMEN SERIES (ABDOMEN  2 VIEW & CHEST 1 VIEW)  COMPARISON:  Chest x-ray 10/07/2011.  FINDINGS: Mediastinum and hilar structures normal. Stable cardiomegaly. No pulmonary venous congestion noted on today's exam. No pleural effusion or pneumothorax. No acute osseous abnormality.  Abdominal soft tissues unremarkable. The gas pattern is nonspecific. Prior lumbar spine fusion. Calcifications within the pelvis consistent with phleboliths.  IMPRESSION: 1. Chest x-ray reveals mild cardiomegaly, no evidence of overt congestive heart failure.  Previously identified pulmonary venous congestion on chest x-ray of 10/07/2011 no longer present. 2. Abdomen is nonfocal.  Prior lumbar spine fusion.   Electronically Signed   By: Marcello Moores  Register   On: 06/21/2013 13:46    Lab Results: Basic Metabolic Panel:  Recent Labs  06/23/13 0647  NA 138  K 3.5*  CL 103  CO2 23  GLUCOSE 212*  BUN 9  CREATININE 0.86  CALCIUM 8.9   Liver Function Tests: No results found for this basename: AST, ALT, ALKPHOS, BILITOT, PROT, ALBUMIN,  in the last 72 hours   CBC: No results found for this basename: WBC, NEUTROABS, HGB, HCT, MCV, PLT,  in the last 72 hours  No results found for this or any previous visit (from the past 240 hour(s)).   Hospital Course: She came to the hospital with intractable nausea and vomiting. This is been going on for several days. She's had previous similar episodes. She was seen in the emergency department but was not well enough for discharge. She was given IV fluids and underwent evaluation with ultrasound of the gallbladder which was normal and a gastric emptying study which showed evidence of delayed gastric emptying. Because she's diabetic this is felt to be related to diabetic gastroparesis. GI consultation was obtained. She initially was treated with Reglan but this is not going to be continued as an outpatient. While she was in the hospital she learned to give herself insulin shots and this will be continued at home by the time of discharge she was no longer nauseated was emptied and drink and keep her food down.  Discharge Exam: Blood pressure 152/59, pulse 74, temperature 97.9 F (36.6 C), temperature source Oral, resp. rate 20, height 5\' 3"  (1.6 m), weight 91.173 kg (201 lb), SpO2 97.00%. She is awake and alert. Her chest is clear. Her heart is regular.  Disposition: Home we discussed home health services and she does not feel that she requires      Discharge Instructions   Discharge patient    Complete by:  As  directed              Signed: Alonza Bogus   06/25/2013, 8:59 AM

## 2013-07-09 ENCOUNTER — Ambulatory Visit (HOSPITAL_COMMUNITY)
Admission: RE | Admit: 2013-07-09 | Discharge: 2013-07-09 | Disposition: A | Discharge status: Home / Self Care | Payer: Medicare PPO | Source: Ambulatory Visit | Attending: Pulmonary Disease | Admitting: Pulmonary Disease

## 2013-07-09 ENCOUNTER — Other Ambulatory Visit (HOSPITAL_COMMUNITY): Payer: Self-pay | Admitting: Pulmonary Disease

## 2013-07-09 DIAGNOSIS — M549 Dorsalgia, unspecified: Secondary | ICD-10-CM

## 2013-07-09 DIAGNOSIS — IMO0002 Reserved for concepts with insufficient information to code with codable children: Secondary | ICD-10-CM | POA: Insufficient documentation

## 2013-07-09 DIAGNOSIS — I517 Cardiomegaly: Secondary | ICD-10-CM | POA: Insufficient documentation

## 2013-07-09 DIAGNOSIS — M47817 Spondylosis without myelopathy or radiculopathy, lumbosacral region: Secondary | ICD-10-CM | POA: Insufficient documentation

## 2013-08-03 ENCOUNTER — Other Ambulatory Visit (HOSPITAL_COMMUNITY): Payer: Self-pay | Admitting: Pulmonary Disease

## 2013-08-03 DIAGNOSIS — R1032 Left lower quadrant pain: Secondary | ICD-10-CM

## 2013-08-06 ENCOUNTER — Ambulatory Visit (HOSPITAL_COMMUNITY)
Admission: RE | Admit: 2013-08-06 | Discharge: 2013-08-06 | Disposition: A | Discharge status: Home / Self Care | Payer: Medicare PPO | Source: Ambulatory Visit | Attending: Pulmonary Disease | Admitting: Pulmonary Disease

## 2013-08-06 DIAGNOSIS — R1032 Left lower quadrant pain: Secondary | ICD-10-CM | POA: Insufficient documentation

## 2013-08-06 DIAGNOSIS — R112 Nausea with vomiting, unspecified: Secondary | ICD-10-CM | POA: Insufficient documentation

## 2013-08-06 DIAGNOSIS — I319 Disease of pericardium, unspecified: Secondary | ICD-10-CM | POA: Insufficient documentation

## 2013-08-06 MED ORDER — IOHEXOL 300 MG/ML  SOLN
100.0000 mL | Freq: Once | INTRAMUSCULAR | Status: AC | PRN
  Administered 2013-08-06: 100 mL via INTRAVENOUS

## 2013-08-06 MED ORDER — SODIUM CHLORIDE 0.9 % IJ SOLN
INTRAMUSCULAR | Status: AC
  Filled 2013-08-06: qty 500

## 2013-08-06 NOTE — Progress Notes (Signed)
patient states that she has an intolerance to oral contrast or any oral medicine, stems from childhood. pt states she is not allergic to IV contrast

## 2013-08-21 ENCOUNTER — Ambulatory Visit (INDEPENDENT_AMBULATORY_CARE_PROVIDER_SITE_OTHER): Payer: Medicare PPO | Admitting: Internal Medicine

## 2013-08-21 ENCOUNTER — Encounter (INDEPENDENT_AMBULATORY_CARE_PROVIDER_SITE_OTHER): Payer: Self-pay | Admitting: Internal Medicine

## 2013-08-21 VITALS — BP 130/90 | HR 86 | Temp 98.7°F | Resp 18 | Ht 63.0 in | Wt 193.7 lb

## 2013-08-21 DIAGNOSIS — R1012 Left upper quadrant pain: Secondary | ICD-10-CM

## 2013-08-21 MED ORDER — LIDOCAINE 5 % EX PTCH: 1.0000 | MEDICATED_PATCH | CUTANEOUS | Status: DC

## 2013-08-21 MED ORDER — DOCUSATE SODIUM 100 MG PO CAPS: 100.0000 mg | ORAL_CAPSULE | Freq: Every day | ORAL | Status: DC

## 2013-08-21 NOTE — Patient Instructions (Signed)
Please remember to keep correct posture. Keep symptom diary until office visit

## 2013-08-21 NOTE — Progress Notes (Signed)
Presenting complaint;  Left-sided abdominal pain.  History of present illness;  Patient is 68 year old Caucasian female who is referred through courtesy of Dr. Luan Pulling for GI evaluation. She presents with a six-week history of left-sided abdominal pain which she describes as soreness. It is experienced both in the left upper and left lower quadrants. Pain is more or less constant but intensity varies and it has never been intractable. This pain is not associated with nausea vomiting hematuria dysuria melena or rectal bleeding. This pain is also not made worse with meals and she has not noted any relationship to walking or physical activity. Pain intensity has not changed since onset. She has noted pain eases when she lies on the left side and at other times when she lies on the right side. She has occasional constipation which is able to control by dietary measures and stool softener. She does not have a good appetite. She has lost 6 pounds in 6 weeks. She had abdominopelvic CT on 7 03/09/2013 and no abnormality noted to account for this pain. She was noted to have cardiomegaly and small pericardial effusion. She denies chest pain or shortness of breath. She has chronic back pain as well as knee pain. She recalls she had EGD and colonoscopy several years ago by Dr. Collene Mares of [facility] and the studies were normal. Patient states she has been under a lot of stress. She lost her husband on March 15 this year secondary to metastatic lung carcinoma. She states all these years he was always there to help her and that is not the case anymore. She underwent EGD infiltrate 2012 432 nausea and vomiting. EGD was normal. She subsequently had gastric emptying study which is also within normal limits.   Current Medications: Outpatient Encounter Prescriptions as of 08/21/2013  Medication Sig  . aspirin 81 MG tablet Take 81 mg by mouth daily.  Marland Kitchen gabapentin (NEURONTIN) 300 MG capsule Take 900 mg by  mouth at bedtime.   Marland Kitchen glipiZIDE (GLUCOTROL) 10 MG tablet Take 10 mg by mouth 2 (two) times daily before a meal.   . insulin detemir (LEVEMIR) 100 UNIT/ML injection Inject 0.12 mLs (12 Units total) into the skin daily.  . Multiple Vitamins-Minerals (MULTIVITAMINS THER. W/MINERALS) TABS tablet Take 1 tablet by mouth daily.  . pantoprazole (PROTONIX) 40 MG tablet Take 40 mg by mouth daily.   . pravastatin (PRAVACHOL) 40 MG tablet Take 40 mg by mouth at bedtime.   . Probiotic Product (PROBIOTIC DAILY PO) Take 1 capsule by mouth at bedtime.  . verapamil (CALAN-SR) 240 MG CR tablet Take 240 mg by mouth daily with breakfast.   . [DISCONTINUED] SitaGLIPtin Phosphate (JANUVIA PO) Take 1 tablet by mouth daily.   Past Medical History  Diagnosis Date  . Diabetes mellitus   . Hypertension   . Neuropathy   . GERD (gastroesophageal reflux disease)   . Complication of anesthesia   . PONV (postoperative nausea and vomiting)   . H/O exercise stress test     good result- 2003.  Also had Echocardiogram 10 yrs. ago, told that valve was thickening, but no need for changes , no need for f/u  . Shortness of breath   . H/O hiatal hernia   . Neuromuscular disorder     neuropathy- legs & feet   . Arthritis     hands, hips, back   . Anemia     borderline anemia   .    Marland Kitchen Stroke    Past Surgical  History  Procedure Laterality Date  . Knee surgery    . Abdominal hysterectomy    . Back surgery  02/2009    L3-S1 fusion  . Colonoscopy    . Upper gastrointestinal endoscopy    . Joint replacement  2005    left  . Eye surgery      cataracts removed- R eye    . Stapedectomy      bilateral, then a 2nd time to R ear     Objective: Blood pressure 130/90, pulse 86, temperature 98.7 F (37.1 C), temperature source Oral, resp. rate 18, height 5\' 3"  (1.6 m), weight 193 lb 11.2 oz (87.862 kg). Patient is alert and appears to be quite anxious and in tears. Conjunctiva is pink. Sclera is nonicteric Oropharyngeal  mucosa is normal. No neck masses or thyromegaly noted. Cardiac exam with regular rhythm normal S1 and S2. No murmur or gallop noted. Lungs are clear to auscultation. Abdomen is full. Bowel sounds are normal. No bruit noted. Abdomen is soft with mild tenderness at LLQ and LUQ on deep palpation. No guarding masses or organomegaly noted. Rectal examination reveals soft stool in the vault it is guaiac negative. No LE edema or clubbing noted.  Labs/studies Results: Lab data from 06/13/2013. WBC 11.3, H&H 12.5 and 38.8; platelet count 235K BUN 11, creatinine 0.7  Serum calcium 9.3  Bilirubin 0.5, AP 157, AST 20, ALT 18 and albumin 3.7   Assessment:  #1. Left-sided abdominal pain of 6 weeks' duration and negative abdominopelvic CT. She has no GI symptoms other than sporadic constipation. Suspect pain is abdominal wall pain and may be related to abnormal posture. Nothing to suggest that she has gallbladder disease. Mildly elevated serum alkaline phosphatase felt to be nonspecific in a patient with history of diabetes and on multiple medications. #2. She is average risk for CRC. Last colonoscopy was done over 10 years ago. #3. Chronic GERD. Symptoms well-controlled with therapy.    Recommendations;  Lidoderm patch 12 hours on 12 hours off. Patient advised to maintain correct posture. She will keep symptom diary and return for office visit in two months. She should consider screening colonoscopy later this year when she can.

## 2013-10-23 ENCOUNTER — Ambulatory Visit (INDEPENDENT_AMBULATORY_CARE_PROVIDER_SITE_OTHER): Payer: Medicare PPO | Admitting: Internal Medicine

## 2014-11-30 ENCOUNTER — Emergency Department (HOSPITAL_COMMUNITY)
Admission: EM | Admit: 2014-11-30 | Discharge: 2014-11-30 | Disposition: A | Discharge status: Home / Self Care | Payer: Medicare PPO | Attending: Emergency Medicine | Admitting: Emergency Medicine

## 2014-11-30 ENCOUNTER — Encounter (HOSPITAL_COMMUNITY): Payer: Self-pay | Admitting: Emergency Medicine

## 2014-11-30 ENCOUNTER — Emergency Department (HOSPITAL_COMMUNITY): Payer: Medicare PPO

## 2014-11-30 DIAGNOSIS — Z7982 Long term (current) use of aspirin: Secondary | ICD-10-CM | POA: Diagnosis not present

## 2014-11-30 DIAGNOSIS — Y9389 Activity, other specified: Secondary | ICD-10-CM | POA: Insufficient documentation

## 2014-11-30 DIAGNOSIS — E119 Type 2 diabetes mellitus without complications: Secondary | ICD-10-CM | POA: Diagnosis not present

## 2014-11-30 DIAGNOSIS — Z862 Personal history of diseases of the blood and blood-forming organs and certain disorders involving the immune mechanism: Secondary | ICD-10-CM | POA: Diagnosis not present

## 2014-11-30 DIAGNOSIS — G629 Polyneuropathy, unspecified: Secondary | ICD-10-CM | POA: Diagnosis not present

## 2014-11-30 DIAGNOSIS — Z8673 Personal history of transient ischemic attack (TIA), and cerebral infarction without residual deficits: Secondary | ICD-10-CM | POA: Insufficient documentation

## 2014-11-30 DIAGNOSIS — I10 Essential (primary) hypertension: Secondary | ICD-10-CM | POA: Insufficient documentation

## 2014-11-30 DIAGNOSIS — M1990 Unspecified osteoarthritis, unspecified site: Secondary | ICD-10-CM | POA: Diagnosis not present

## 2014-11-30 DIAGNOSIS — Y998 Other external cause status: Secondary | ICD-10-CM | POA: Diagnosis not present

## 2014-11-30 DIAGNOSIS — X58XXXA Exposure to other specified factors, initial encounter: Secondary | ICD-10-CM | POA: Insufficient documentation

## 2014-11-30 DIAGNOSIS — Y92002 Bathroom of unspecified non-institutional (private) residence single-family (private) house as the place of occurrence of the external cause: Secondary | ICD-10-CM | POA: Diagnosis not present

## 2014-11-30 DIAGNOSIS — Z794 Long term (current) use of insulin: Secondary | ICD-10-CM | POA: Insufficient documentation

## 2014-11-30 DIAGNOSIS — K219 Gastro-esophageal reflux disease without esophagitis: Secondary | ICD-10-CM | POA: Diagnosis not present

## 2014-11-30 DIAGNOSIS — M545 Low back pain, unspecified: Secondary | ICD-10-CM

## 2014-11-30 DIAGNOSIS — Z859 Personal history of malignant neoplasm, unspecified: Secondary | ICD-10-CM | POA: Diagnosis not present

## 2014-11-30 DIAGNOSIS — Z79899 Other long term (current) drug therapy: Secondary | ICD-10-CM | POA: Diagnosis not present

## 2014-11-30 DIAGNOSIS — S3992XA Unspecified injury of lower back, initial encounter: Secondary | ICD-10-CM | POA: Insufficient documentation

## 2014-11-30 MED ORDER — ONDANSETRON 4 MG PO TBDP
4.0000 mg | ORAL_TABLET | Freq: Once | ORAL | Status: AC
  Administered 2014-11-30: 4 mg via ORAL
  Filled 2014-11-30: qty 1

## 2014-11-30 MED ORDER — HYDROMORPHONE HCL 1 MG/ML IJ SOLN
1.0000 mg | Freq: Once | INTRAMUSCULAR | Status: AC
  Administered 2014-11-30: 1 mg via INTRAMUSCULAR
  Filled 2014-11-30: qty 1

## 2014-11-30 MED ORDER — OXYCODONE-ACETAMINOPHEN 5-325 MG PO TABS: 1.0000 | ORAL_TABLET | Freq: Four times a day (QID) | ORAL | Status: AC | PRN

## 2014-11-30 NOTE — ED Notes (Signed)
Pt states she usually uses a BSC but she had to use the toilet in the bathroom at 0200 this morning. Pt states she did not expect the toilet to be so low and she "flopped" down on it. Since then she has had mid-back pain radiating around left side. Hx of previous back surgeries.

## 2014-11-30 NOTE — ED Notes (Signed)
Pt c/o nausea after pain medication and moving around in xray. Wet cloth and odt zofran given. nad noted.

## 2014-11-30 NOTE — ED Provider Notes (Signed)
CSN: [id number]     Arrival date & time 11/30/14  6283 History  By signing my name below, I, Soijett Blue, attest that this documentation has been prepared under the direction and in the presence of Milton Ferguson, MD. Electronically Signed: Soijett Blue, ED Scribe. 11/30/2014. 9:37 AM.   Chief Complaint  Patient presents with  . Back Pain      Patient is a 69 y.o. female presenting with back pain. The history is provided by the patient (the patient). No language interpreter was used.  Back Pain Location:  Lumbar spine Quality: soreness. Radiates to:  Does not radiate Pain severity:  Moderate Pain is:  Same all the time Onset quality:  Sudden Duration:  7 hours Timing:  Constant Progression:  Unchanged Chronicity:  New Context comment:  Flopping on toilet too hard, due to it being a lower height Relieved by:  None tried Worsened by:  Movement Ineffective treatments:  None tried Associated symptoms: no abdominal pain, no chest pain and no headaches   Risk factors: no recent surgery     HPI Comments: Lori Mahoney is a 69 y.o. female with a medical hx of DM, HTN, arthritis, who presents to the Emergency Department complaining of constant mid back pain onset this morning at 2 AM. She notes that she uses a bedside commode and that she doesn't typically use a regular toilet. She reports that when she went to go use her regular normal toilet the seat wiggled and she sat down pretty hard onto the toilet due to her not being used the height of the toilet. She reports that the back pain does not radiate and she describes the pain as a sore sensation. She states that she has not tried any medications for the relief for her symptoms. Pt denies abdominal pain, leg pain, and any other symptoms. She notes that she has had 2 prior back surgeries completed by Dr. Vertell Limber.    Past Medical History  Diagnosis Date  . Diabetes mellitus   . Hypertension   . Neuropathy ([place])   . GERD (gastroesophageal  reflux disease)   . Complication of anesthesia   . PONV (postoperative nausea and vomiting)   . H/O exercise stress test     good result- 2003.  Also had Echocardiogram 10 yrs. ago, told that valve was thickening, but no need for changes , no need for f/u  . Shortness of breath   . H/O hiatal hernia   . Neuromuscular disorder ([facility])     neuropathy- legs & feet   . Arthritis     hands, hips, back   . Anemia     borderline anemia   . Cancer ([place])   . Stroke [facility])    Past Surgical History  Procedure Laterality Date  . Knee surgery    . Abdominal hysterectomy    . Back surgery  02/2009    L3-S1 fusion  . Colonoscopy    . Upper gastrointestinal endoscopy    . Joint replacement  2005    left  . Eye surgery      cataracts removed- R eye    . Stapedectomy      bilateral, then a 2nd time to R ear   Family History  Problem Relation Age of Onset  . Arthritis    . Cancer Brother     prostate  . Colon cancer Mother     age 33  . Breast cancer Sister   . Diabetes Brother  Social History  Substance Use Topics  . Smoking status: Never Smoker   . Smokeless tobacco: Never Used  . Alcohol Use: No   OB History    No data available     Review of Systems  Constitutional: Negative for appetite change and fatigue.  HENT: Negative for congestion, ear discharge and sinus pressure.   Eyes: Negative for discharge.  Respiratory: Negative for cough.   Cardiovascular: Negative for chest pain.  Gastrointestinal: Negative for abdominal pain and diarrhea.  Genitourinary: Negative for frequency and hematuria.  Musculoskeletal: Positive for back pain. Negative for arthralgias.  Skin: Negative for rash.  Neurological: Negative for seizures and headaches.  Psychiatric/Behavioral: Negative for hallucinations.      Allergies  Codeine; Hydrocodone-acetaminophen; Meperidine hcl; Contrast media; Oxycodone; and Tetracyclines & related  Home Medications   Prior to Admission medications    Medication Sig Start Date End Date Taking? Authorizing Provider  aspirin 81 MG tablet Take 81 mg by mouth daily.   Yes Historical Provider, MD  gabapentin (NEURONTIN) 300 MG capsule Take 900 mg by mouth at bedtime.  09/26/10  Yes Historical Provider, MD  glipiZIDE (GLUCOTROL) 10 MG tablet Take 10 mg by mouth 2 (two) times daily before a meal.  08/16/13  Yes Historical Provider, MD  lisinopril (PRINIVIL,ZESTRIL) 20 MG tablet Take 20 mg by mouth daily. 11/07/14  Yes Historical Provider, MD  pantoprazole (PROTONIX) 40 MG tablet Take 40 mg by mouth daily.  08/13/13  Yes Historical Provider, MD  pravastatin (PRAVACHOL) 40 MG tablet Take 40 mg by mouth at bedtime.    Yes Historical Provider, MD  verapamil (CALAN-SR) 240 MG CR tablet Take 240 mg by mouth daily with breakfast.  09/23/10  Yes Historical Provider, MD  docusate sodium (COLACE) 100 MG capsule Take 1 capsule (100 mg total) by mouth daily. 08/21/13   [name], MD  insulin detemir (LEVEMIR) 100 UNIT/ML injection Inject 0.12 mLs (12 Units total) into the skin daily. 06/24/13   [name], MD  lidocaine (LIDODERM) 5 % Place 1 patch onto the skin daily. Remove & Discard patch within 12 hours or as directed by MD 08/21/13   [name], MD  Multiple Vitamins-Minerals (MULTIVITAMINS THER. W/MINERALS) TABS tablet Take 1 tablet by mouth daily.    Historical Provider, MD   BP 168/96 mmHg  Pulse 75  Temp(Src) 98.1 F (36.7 C) (Oral)  Resp 18  Ht 5\' 3"  (1.6 m)  Wt 200 lb (90.719 kg)  BMI 35.44 kg/m2  SpO2 96% Physical Exam  Constitutional: She is oriented to person, place, and time. She appears well-developed.  HENT:  Head: Normocephalic.  Eyes: Conjunctivae and EOM are normal. No scleral icterus.  Neck: Neck supple. No thyromegaly present.  Cardiovascular: Normal rate and regular rhythm.  Exam reveals no gallop and no friction rub.   No murmur heard. Pulmonary/Chest: No stridor. She has no wheezes. She has no rales. She exhibits no  tenderness.  Abdominal: She exhibits no distension. There is no tenderness. There is no rebound.  Musculoskeletal: Normal range of motion. She exhibits no edema.  Tender in lumbar spine. Positive SLR bilaterally.  Lymphadenopathy:    She has no cervical adenopathy.  Neurological: She is oriented to person, place, and time. She exhibits normal muscle tone. Coordination normal.  Skin: No rash noted. No erythema.  Psychiatric: She has a normal mood and affect. Her behavior is normal.  Nursing note and vitals reviewed.   ED Course  Procedures (including critical care  time) DIAGNOSTIC STUDIES: Oxygen Saturation is 96% on RA, nl by my interpretation.    COORDINATION OF CARE: 9:34 AM Discussed treatment plan with pt at bedside which includes dilaudid injection and xray of lumbar spine and pt agreed to plan.    Labs Review Labs Reviewed - No data to display  Imaging Review Dg Lumbar Spine Complete  11/30/2014  CLINICAL DATA:  69 year old female with lumbar spine pain after falling down on the toilet seat earlier this morning EXAM: LUMBAR SPINE - COMPLETE 4+ VIEW COMPARISON:  Prior lumbar spine radiographs 07/09/2013 FINDINGS: Postoperative changes of prior posterior lumbar interbody fusion extending from L2 through S1 without significant interval change in alignment or configuration. Fractures of the bilateral rods again noted without interval change. The screws remain intact. There is transitional anatomy with lumbarization at S1. No evidence of acute fracture or malalignment. The bones appear diffusely demineralized. IMPRESSION: 1. No evidence of acute fracture, malalignment or change in hardware configuration. 2. Surgical changes of posterior lumbar interbody fusion with bilateral pedicle screw and rod construct extending from L2 through S1 with stable fractures of the bilateral rods. 3. Transitional anatomy with lumbarization of S1. Electronically Signed   By: Jacqulynn Cadet M.D.   On:  11/30/2014 10:21   I have personally reviewed and evaluated these images as part of my medical decision-making.   EKG Interpretation None      MDM   Final diagnoses:  None    X-rays unremarkable back pain improved with narcotic injection. Suspect lumbar strain patient will be discharged with pain medicine and follow-up with PCP  Milton Ferguson, MD 11/30/14 1240

## 2014-11-30 NOTE — Discharge Instructions (Signed)
Follow up with your md this week for recheck  °

## 2015-01-30 ENCOUNTER — Emergency Department (HOSPITAL_COMMUNITY): Payer: Medicare Other

## 2015-01-30 ENCOUNTER — Encounter (HOSPITAL_COMMUNITY): Payer: Self-pay

## 2015-01-30 ENCOUNTER — Inpatient Hospital Stay (HOSPITAL_COMMUNITY)
Admission: EM | Admit: 2015-01-30 | Discharge: 2015-02-06 | DRG: 074 | Disposition: A | Discharge status: Skilled Nursing Facility | Payer: Medicare Other | Attending: Pulmonary Disease | Admitting: Pulmonary Disease

## 2015-01-30 DIAGNOSIS — E1165 Type 2 diabetes mellitus with hyperglycemia: Secondary | ICD-10-CM | POA: Diagnosis present

## 2015-01-30 DIAGNOSIS — Z833 Family history of diabetes mellitus: Secondary | ICD-10-CM | POA: Diagnosis not present

## 2015-01-30 DIAGNOSIS — F329 Major depressive disorder, single episode, unspecified: Secondary | ICD-10-CM | POA: Diagnosis present

## 2015-01-30 DIAGNOSIS — M1990 Unspecified osteoarthritis, unspecified site: Secondary | ICD-10-CM | POA: Diagnosis present

## 2015-01-30 DIAGNOSIS — R109 Unspecified abdominal pain: Secondary | ICD-10-CM

## 2015-01-30 DIAGNOSIS — E1310 Other specified diabetes mellitus with ketoacidosis without coma: Secondary | ICD-10-CM | POA: Diagnosis not present

## 2015-01-30 DIAGNOSIS — I10 Essential (primary) hypertension: Secondary | ICD-10-CM | POA: Diagnosis present

## 2015-01-30 DIAGNOSIS — Z7982 Long term (current) use of aspirin: Secondary | ICD-10-CM | POA: Diagnosis not present

## 2015-01-30 DIAGNOSIS — R51 Headache: Secondary | ICD-10-CM | POA: Diagnosis present

## 2015-01-30 DIAGNOSIS — Z981 Arthrodesis status: Secondary | ICD-10-CM

## 2015-01-30 DIAGNOSIS — Z8673 Personal history of transient ischemic attack (TIA), and cerebral infarction without residual deficits: Secondary | ICD-10-CM | POA: Diagnosis not present

## 2015-01-30 DIAGNOSIS — R1110 Vomiting, unspecified: Secondary | ICD-10-CM | POA: Diagnosis not present

## 2015-01-30 DIAGNOSIS — R112 Nausea with vomiting, unspecified: Secondary | ICD-10-CM | POA: Diagnosis present

## 2015-01-30 DIAGNOSIS — E876 Hypokalemia: Secondary | ICD-10-CM | POA: Diagnosis present

## 2015-01-30 DIAGNOSIS — Z800 Family history of malignant neoplasm of digestive organs: Secondary | ICD-10-CM

## 2015-01-30 DIAGNOSIS — F32A Depression, unspecified: Secondary | ICD-10-CM | POA: Diagnosis present

## 2015-01-30 DIAGNOSIS — Z6835 Body mass index (BMI) 35.0-35.9, adult: Secondary | ICD-10-CM | POA: Diagnosis not present

## 2015-01-30 DIAGNOSIS — E119 Type 2 diabetes mellitus without complications: Secondary | ICD-10-CM | POA: Diagnosis not present

## 2015-01-30 DIAGNOSIS — E1143 Type 2 diabetes mellitus with diabetic autonomic (poly)neuropathy: Secondary | ICD-10-CM | POA: Diagnosis present

## 2015-01-30 DIAGNOSIS — K3184 Gastroparesis: Secondary | ICD-10-CM | POA: Diagnosis present

## 2015-01-30 DIAGNOSIS — Z9111 Patient's noncompliance with dietary regimen: Secondary | ICD-10-CM

## 2015-01-30 DIAGNOSIS — R519 Headache, unspecified: Secondary | ICD-10-CM

## 2015-01-30 DIAGNOSIS — Z794 Long term (current) use of insulin: Secondary | ICD-10-CM | POA: Diagnosis not present

## 2015-01-30 DIAGNOSIS — K210 Gastro-esophageal reflux disease with esophagitis: Secondary | ICD-10-CM | POA: Diagnosis present

## 2015-01-30 DIAGNOSIS — E860 Dehydration: Secondary | ICD-10-CM | POA: Diagnosis present

## 2015-01-30 DIAGNOSIS — K3189 Other diseases of stomach and duodenum: Secondary | ICD-10-CM | POA: Diagnosis not present

## 2015-01-30 DIAGNOSIS — E1110 Type 2 diabetes mellitus with ketoacidosis without coma: Secondary | ICD-10-CM

## 2015-01-30 DIAGNOSIS — Z803 Family history of malignant neoplasm of breast: Secondary | ICD-10-CM | POA: Diagnosis not present

## 2015-01-30 DIAGNOSIS — K208 Other esophagitis: Secondary | ICD-10-CM | POA: Diagnosis not present

## 2015-01-30 HISTORY — DX: Gastroparesis: K31.84

## 2015-01-30 LAB — COMPREHENSIVE METABOLIC PANEL
ALK PHOS: 118 U/L (ref 38–126)
ALT: 12 U/L — AB (ref 14–54)
AST: 20 U/L (ref 15–41)
Albumin: 3.9 g/dL (ref 3.5–5.0)
Anion gap: 17 — ABNORMAL HIGH (ref 5–15)
BUN: 13 mg/dL (ref 6–20)
CALCIUM: 9.4 mg/dL (ref 8.9–10.3)
CHLORIDE: 100 mmol/L — AB (ref 101–111)
CO2: 21 mmol/L — AB (ref 22–32)
CREATININE: 0.91 mg/dL (ref 0.44–1.00)
GFR calc Af Amer: >60 mL/min (ref >60)
GFR calc non Af Amer: >60 mL/min (ref >60)
GLUCOSE: 337 mg/dL — AB (ref 65–99)
Potassium: 3.2 mmol/L — ABNORMAL LOW (ref 3.5–5.1)
SODIUM: 138 mmol/L (ref 135–145)
Total Bilirubin: 0.9 mg/dL (ref 0.3–1.2)
Total Protein: 8.3 g/dL — ABNORMAL HIGH (ref 6.5–8.1)

## 2015-01-30 LAB — CBC WITH DIFFERENTIAL/PLATELET
BASOS ABS: 0 10*3/uL (ref 0.0–0.1)
Basophils Relative: 0 %
EOS ABS: 0 10*3/uL (ref 0.0–0.7)
EOS PCT: 0 %
HCT: 40.7 % (ref 36.0–46.0)
HEMOGLOBIN: 13.6 g/dL (ref 12.0–15.0)
LYMPHS ABS: 2.1 10*3/uL (ref 0.7–4.0)
LYMPHS PCT: 17 %
MCH: 29.1 pg (ref 26.0–34.0)
MCHC: 33.4 g/dL (ref 30.0–36.0)
MCV: 87 fL (ref 78.0–100.0)
Monocytes Absolute: 0.6 10*3/uL (ref 0.1–1.0)
Monocytes Relative: 5 %
NEUTROS PCT: 78 %
Neutro Abs: 9.5 10*3/uL — ABNORMAL HIGH (ref 1.7–7.7)
PLATELETS: 255 10*3/uL (ref 150–400)
RBC: 4.68 MIL/uL (ref 3.87–5.11)
RDW: 13.5 % (ref 11.5–15.5)
WBC: 12.3 10*3/uL — AB (ref 4.0–10.5)

## 2015-01-30 LAB — URINE MICROSCOPIC-ADD ON
Bacteria, UA: NONE SEEN
WBC UA: NONE SEEN WBC/hpf (ref 0–5)

## 2015-01-30 LAB — URINALYSIS, ROUTINE W REFLEX MICROSCOPIC
BILIRUBIN URINE: NEGATIVE
GLUCOSE, UA: 500 mg/dL — AB
Leukocytes, UA: NEGATIVE
Nitrite: NEGATIVE
PH: 6 (ref 5.0–8.0)
Protein, ur: 100 mg/dL — AB
SPECIFIC GRAVITY, URINE: 1.02 (ref 1.005–1.030)

## 2015-01-30 LAB — MRSA PCR SCREENING: MRSA BY PCR: NEGATIVE

## 2015-01-30 LAB — CBG MONITORING, ED
GLUCOSE-CAPILLARY: 241 mg/dL — AB (ref 65–99)
GLUCOSE-CAPILLARY: 283 mg/dL — AB (ref 65–99)

## 2015-01-30 LAB — GLUCOSE, CAPILLARY: Glucose-Capillary: 205 mg/dL — ABNORMAL HIGH (ref 65–99)

## 2015-01-30 MED ORDER — INSULIN ASPART 100 UNIT/ML ~~LOC~~ SOLN: 0.0000 [IU] | Freq: Every day | SUBCUTANEOUS | Status: DC

## 2015-01-30 MED ORDER — ASPIRIN EC 81 MG PO TBEC: 81.0000 mg | DELAYED_RELEASE_TABLET | Freq: Every day | ORAL | Status: DC

## 2015-01-30 MED ORDER — HYDRALAZINE HCL 20 MG/ML IJ SOLN
5.0000 mg | INTRAMUSCULAR | Status: DC | PRN
  Administered 2015-02-03 – 2015-02-04 (×5): 5 mg via INTRAVENOUS
  Filled 2015-01-30 (×5): qty 1

## 2015-01-30 MED ORDER — METOCLOPRAMIDE HCL 5 MG/ML IJ SOLN
10.0000 mg | Freq: Three times a day (TID) | INTRAMUSCULAR | Status: DC
  Administered 2015-01-30 – 2015-01-31 (×3): 10 mg via INTRAVENOUS
  Filled 2015-01-30 (×3): qty 2

## 2015-01-30 MED ORDER — PANTOPRAZOLE SODIUM 40 MG PO TBEC: 40.0000 mg | DELAYED_RELEASE_TABLET | Freq: Every day | ORAL | Status: DC

## 2015-01-30 MED ORDER — ONDANSETRON HCL 4 MG/2ML IJ SOLN
4.0000 mg | Freq: Four times a day (QID) | INTRAMUSCULAR | Status: DC | PRN
  Administered 2015-01-30 – 2015-01-31 (×3): 4 mg via INTRAVENOUS
  Filled 2015-01-30 (×5): qty 2

## 2015-01-30 MED ORDER — GLIPIZIDE 5 MG PO TABS: 10.0000 mg | ORAL_TABLET | Freq: Two times a day (BID) | ORAL | Status: DC

## 2015-01-30 MED ORDER — ENOXAPARIN SODIUM 40 MG/0.4ML ~~LOC~~ SOLN
40.0000 mg | SUBCUTANEOUS | Status: DC
Start: 2015-01-30 — End: 2015-01-30

## 2015-01-30 MED ORDER — ONDANSETRON HCL 4 MG/2ML IJ SOLN
4.0000 mg | Freq: Once | INTRAMUSCULAR | Status: AC
  Administered 2015-01-30: 4 mg via INTRAVENOUS
  Filled 2015-01-30: qty 2

## 2015-01-30 MED ORDER — INSULIN DETEMIR 100 UNIT/ML ~~LOC~~ SOLN
20.0000 [IU] | Freq: Every day | SUBCUTANEOUS | Status: DC
  Administered 2015-01-30: 20 [IU] via SUBCUTANEOUS
  Filled 2015-01-30: qty 0.2

## 2015-01-30 MED ORDER — SODIUM CHLORIDE 0.9 % IV SOLN: INTRAVENOUS | Status: DC

## 2015-01-30 MED ORDER — VERAPAMIL HCL ER 240 MG PO TBCR
240.0000 mg | EXTENDED_RELEASE_TABLET | Freq: Every day | ORAL | Status: DC
  Administered 2015-01-31 – 2015-02-04 (×5): 240 mg via ORAL
  Filled 2015-01-30 (×5): qty 1

## 2015-01-30 MED ORDER — METOCLOPRAMIDE HCL 5 MG/ML IJ SOLN
10.0000 mg | Freq: Once | INTRAMUSCULAR | Status: AC
  Administered 2015-01-30: 10 mg via INTRAVENOUS
  Filled 2015-01-30: qty 2

## 2015-01-30 MED ORDER — INSULIN ASPART 100 UNIT/ML ~~LOC~~ SOLN
10.0000 [IU] | Freq: Once | SUBCUTANEOUS | Status: AC
  Administered 2015-01-30: 10 [IU] via INTRAVENOUS
  Filled 2015-01-30: qty 1

## 2015-01-30 MED ORDER — ONDANSETRON HCL 4 MG PO TABS
4.0000 mg | ORAL_TABLET | Freq: Four times a day (QID) | ORAL | Status: DC | PRN
  Filled 2015-01-30: qty 1

## 2015-01-30 MED ORDER — PRAVASTATIN SODIUM 40 MG PO TABS: 40.0000 mg | ORAL_TABLET | Freq: Every day | ORAL | Status: DC

## 2015-01-30 MED ORDER — POTASSIUM CHLORIDE CRYS ER 20 MEQ PO TBCR
40.0000 meq | EXTENDED_RELEASE_TABLET | Freq: Once | ORAL | Status: AC
  Administered 2015-01-30: 40 meq via ORAL
  Filled 2015-01-30: qty 2

## 2015-01-30 MED ORDER — SODIUM CHLORIDE 0.9 % IV BOLUS (SEPSIS)
2000.0000 mL | Freq: Once | INTRAVENOUS | Status: AC
  Administered 2015-01-30: 1000 mL via INTRAVENOUS

## 2015-01-30 MED ORDER — ENOXAPARIN SODIUM 40 MG/0.4ML ~~LOC~~ SOLN
40.0000 mg | SUBCUTANEOUS | Status: DC
  Administered 2015-01-30 – 2015-02-03 (×5): 40 mg via SUBCUTANEOUS
  Filled 2015-01-30 (×5): qty 0.4

## 2015-01-30 MED ORDER — INSULIN ASPART 100 UNIT/ML ~~LOC~~ SOLN: 0.0000 [IU] | Freq: Three times a day (TID) | SUBCUTANEOUS | Status: DC

## 2015-01-30 MED ORDER — SODIUM CHLORIDE 0.9 % IV BOLUS (SEPSIS)
1000.0000 mL | Freq: Once | INTRAVENOUS | Status: AC
  Administered 2015-01-30: 1000 mL via INTRAVENOUS

## 2015-01-30 MED ORDER — GABAPENTIN 400 MG PO CAPS
1200.0000 mg | ORAL_CAPSULE | Freq: Every day | ORAL | Status: DC
  Filled 2015-01-30: qty 3

## 2015-01-30 MED ORDER — SODIUM CHLORIDE 0.9 % IJ SOLN
3.0000 mL | Freq: Two times a day (BID) | INTRAMUSCULAR | Status: DC
  Administered 2015-01-31 – 2015-02-04 (×6): 3 mL via INTRAVENOUS

## 2015-01-30 MED ORDER — SODIUM CHLORIDE 0.9 % IV SOLN
INTRAVENOUS | Status: DC
  Administered 2015-01-30: 1000 mL via INTRAVENOUS
  Administered 2015-01-31 – 2015-02-05 (×6): via INTRAVENOUS

## 2015-01-30 NOTE — H&P (Signed)
[facility] History and Physical  Lori Mahoney [id number] DOB: 27-Jan-1945 DOA: 01/30/2015  Referring physician: ER PCP: Alonza Bogus, MD   Chief Complaint: Vomiting, intractable.  HPI: Lori Mahoney is a 70 y.o. female  This is a 70 year old lady, diabetic, who is known to have gastroparesis, who presents with a 2 day history of vomiting and diarrhea. She has had constant vomiting in the last couple of days associated with 8 episodes of diarrhea. She apparently vomits every couple of hours. She denies any fever or chills. There is no significant abdominal pain. She was unable to keep some of her medications down including antihypertensive medications. Evaluation in the emergency room shows her to have uncontrolled diabetes and possibly mild DKA and she is now being admitted for her gastroparesis.   Review of Systems:  Apart from symptoms above, all systems are negative.   Past Medical History  Diagnosis Date  . Diabetes mellitus   . Hypertension   . Neuropathy ([place])   . GERD (gastroesophageal reflux disease)   . Complication of anesthesia   . PONV (postoperative nausea and vomiting)   . H/O exercise stress test     good result- 2003.  Also had Echocardiogram 10 yrs. ago, told that valve was thickening, but no need for changes , no need for f/u  . Shortness of breath   . H/O hiatal hernia   . Neuromuscular disorder (HCC)     neuropathy- legs & feet   . Arthritis     hands, hips, back   . Anemia     borderline anemia   . Cancer ([place])   . Stroke ([place])   . Gastroparesis    Past Surgical History  Procedure Laterality Date  . Knee surgery    . Abdominal hysterectomy    . Back surgery  02/2009    L3-S1 fusion  . Colonoscopy    . Upper gastrointestinal endoscopy    . Joint replacement  2005    left  . Eye surgery      cataracts removed- R eye    . Stapedectomy      bilateral, then a 2nd time to R ear   Social History:  reports that she has never smoked. She  has never used smokeless tobacco. She reports that she does not drink alcohol or use illicit drugs.  Allergies  Allergen Reactions  . Codeine Nausea And Vomiting  . Hydrocodone-Acetaminophen Nausea And Vomiting  . Meperidine Hcl Nausea And Vomiting  . Contrast Media [Iodinated Diagnostic Agents] Nausea And Vomiting  . Oxycodone Nausea And Vomiting  . Tetracyclines & Related Rash    Family History  Problem Relation Age of Onset  . Arthritis    . Cancer Brother     prostate  . Colon cancer Mother     age 68  . Breast cancer Sister   . Diabetes Brother     Prior to Admission medications   Medication Sig Start Date End Date Taking? Authorizing Provider  aspirin EC 81 MG tablet Take 81 mg by mouth daily.   Yes Historical Provider, MD  gabapentin (NEURONTIN) 300 MG capsule Take 1,200 mg by mouth at bedtime.  09/26/10  Yes Historical Provider, MD  glipiZIDE (GLUCOTROL) 10 MG tablet Take 10 mg by mouth 2 (two) times daily before a meal.  08/16/13  Yes Historical Provider, MD  Insulin Detemir (LEVEMIR FLEXPEN) 100 UNIT/ML Pen Inject 20 Units into the skin at bedtime.   Yes Historical Provider, MD  Multiple Vitamins-Minerals (CENTRUM SILVER PO) Take 1 tablet by mouth daily.   Yes Historical Provider, MD  naproxen sodium (ALEVE) 220 MG tablet Take 220 mg by mouth 2 (two) times daily as needed (pain).   Yes Historical Provider, MD  pantoprazole (PROTONIX) 40 MG tablet Take 40 mg by mouth daily.  08/13/13  Yes Historical Provider, MD  pravastatin (PRAVACHOL) 40 MG tablet Take 40 mg by mouth at bedtime.    Yes Historical Provider, MD  verapamil (CALAN-SR) 240 MG CR tablet Take 240 mg by mouth daily with breakfast.  09/23/10  Yes Historical Provider, MD  docusate sodium (COLACE) 100 MG capsule Take 1 capsule (100 mg total) by mouth daily. Patient not taking: Reported on 11/30/2014 08/21/13   [name], MD  insulin detemir (LEVEMIR) 100 UNIT/ML injection Inject 0.12 mLs (12 Units total) into the  skin daily. Patient not taking: Reported on 11/30/2014 06/24/13   [name], MD  lidocaine (LIDODERM) 5 % Place 1 patch onto the skin daily. Remove & Discard patch within 12 hours or as directed by MD Patient not taking: Reported on 11/30/2014 08/21/13   [name], MD  oxyCODONE-acetaminophen (PERCOCET/ROXICET) 5-325 MG tablet Take 1 tablet by mouth every 6 (six) hours as needed. Patient not taking: Reported on 01/30/2015 11/30/14   Milton Ferguson, MD   Physical Exam: Filed Vitals:   01/30/15 1700 01/30/15 1719 01/30/15 1730 01/30/15 1800  BP: 175/83 175/83 178/88 185/94  Pulse: 98 104 103 108  Temp:  99 F (37.2 C)    TempSrc:  Oral    Resp: 10 20 17 17   Height:      Weight:      SpO2: 99% 97% 95% 95%    Wt Readings from Last 3 Encounters:  01/30/15 90.719 kg (200 lb)  11/30/14 90.719 kg (200 lb)  08/21/13 87.862 kg (193 lb 11.2 oz)    General:  Appears clinically dehydrated. She is not toxic or septic. Blood pressure is elevated. Eyes: PERRL, normal lids, irises & conjunctiva ENT: grossly normal hearing, lips & tongue Neck: no LAD, masses or thyromegaly Cardiovascular: RRR, no m/r/g. No LE edema. Telemetry: SR, no arrhythmias  Respiratory: CTA bilaterally, no w/r/r. Normal respiratory effort. Abdomen: soft, ntnd Skin: no rash or induration seen on limited exam Musculoskeletal: grossly normal tone BUE/BLE Psychiatric: grossly normal mood and affect, speech fluent and appropriate Neurologic: grossly non-focal.          Labs on Admission:  Basic Metabolic Panel:  Recent Labs Lab 01/30/15 1055  NA 138  K 3.2*  CL 100*  CO2 21*  GLUCOSE 337*  BUN 13  CREATININE 0.91  CALCIUM 9.4   Liver Function Tests:  Recent Labs Lab 01/30/15 1055  AST 20  ALT 12*  ALKPHOS 118  BILITOT 0.9  PROT 8.3*  ALBUMIN 3.9   No results for input(s): LIPASE, AMYLASE in the last 168 hours. No results for input(s): AMMONIA in the last 168 hours. CBC:  Recent Labs Lab  01/30/15 1055  WBC 12.3*  NEUTROABS 9.5*  HGB 13.6  HCT 40.7  MCV 87.0  PLT 255   Cardiac Enzymes: No results for input(s): CKTOTAL, CKMB, CKMBINDEX, TROPONINI in the last 168 hours.  BNP (last 3 results) No results for input(s): BNP in the last 8760 hours.  ProBNP (last 3 results) No results for input(s): PROBNP in the last 8760 hours.  CBG:  Recent Labs Lab 01/30/15 1807  GLUCAP 241*    Radiological Exams on  Admission: Dg Abd Acute W/chest  01/30/2015  CLINICAL DATA:  Complaining of gradual onset, constant, nausea, vomiting, and diarrhea x 2 days. Pt reports 8 episodes of diarrhea since onset and vomiting every 1.5 hours. She also complains of , headache, cough, and pain underneath her right breast. EXAM: DG ABDOMEN ACUTE W/ 1V CHEST COMPARISON:  None. FINDINGS: There is no evidence of dilated bowel loops or free intraperitoneal air. No radiopaque calculi or other significant radiographic abnormality is seen. Heart size and mediastinal contours are within normal limits. Both lungs are clear. Posterior spinal fusion hardware. Bilateral shoulder arthroplasties. IMPRESSION: Negative abdominal radiographs.  No acute cardiopulmonary disease. Electronically Signed   By: Kathreen Devoid   On: 01/30/2015 12:48      Assessment/Plan   1. Gastroparesis. She'll be treated with IV fluids, intravenous metoclopramide on a schedule. 2. Uncontrolled diabetes. Looking at her electrolytes, she does have a slight increase in anion gap and she may actually have mild DKA. However, I think that she will do well without needing intravenous insulin. She will be given intravenous fluids and sliding scale of insulin for close monitoring. If she deteriorates, she will need IV insulin. 3. Hypertension. Her blood pressure is elevated. Use IV hydralazine when necessary.  I will admit patient to stepdown unit for close monitoring. Further recommendations will depend on patient's hospital progress.   Code  Status: Full code.  DVT Prophylaxis: Lovenox.  Family Communication: I discussed the plan with the patient at the bedside.   Disposition Plan: Home when medically stable.   Time spent: 60 minutes.  Doree Albee [facility] Pager [phone].

## 2015-01-30 NOTE — ED Provider Notes (Signed)
Patient with persistent vomiting since Tuesday has a history of gastroparesis usually fairly well controlled. Patient still with dry heaves. Patient with marginal electrolytes. Patient will get further dehydrated if discharged home because not able to keep anything down. We'll discuss with hospitalist regarding admission.  The patient was given oral potassium and she was able to keep that down.  Results for orders placed or performed during the hospital encounter of 01/30/15  CBC with Differential  Result Value Ref Range   WBC 12.3 (H) 4.0 - 10.5 K/uL   RBC 4.68 3.87 - 5.11 MIL/uL   Hemoglobin 13.6 12.0 - 15.0 g/dL   HCT 40.7 36.0 - 46.0 %   MCV 87.0 78.0 - 100.0 fL   MCH 29.1 26.0 - 34.0 pg   MCHC 33.4 30.0 - 36.0 g/dL   RDW 13.5 11.5 - 15.5 %   Platelets 255 150 - 400 K/uL   Neutrophils Relative % 78 %   Neutro Abs 9.5 (H) 1.7 - 7.7 K/uL   Lymphocytes Relative 17 %   Lymphs Abs 2.1 0.7 - 4.0 K/uL   Monocytes Relative 5 %   Monocytes Absolute 0.6 0.1 - 1.0 K/uL   Eosinophils Relative 0 %   Eosinophils Absolute 0.0 0.0 - 0.7 K/uL   Basophils Relative 0 %   Basophils Absolute 0.0 0.0 - 0.1 K/uL  Comprehensive metabolic panel  Result Value Ref Range   Sodium 138 135 - 145 mmol/L   Potassium 3.2 (L) 3.5 - 5.1 mmol/L   Chloride 100 (L) 101 - 111 mmol/L   CO2 21 (L) 22 - 32 mmol/L   Glucose, Bld 337 (H) 65 - 99 mg/dL   BUN 13 6 - 20 mg/dL   Creatinine, Ser 0.91 0.44 - 1.00 mg/dL   Calcium 9.4 8.9 - 10.3 mg/dL   Total Protein 8.3 (H) 6.5 - 8.1 g/dL   Albumin 3.9 3.5 - 5.0 g/dL   AST 20 15 - 41 U/L   ALT 12 (L) 14 - 54 U/L   Alkaline Phosphatase 118 38 - 126 U/L   Total Bilirubin 0.9 0.3 - 1.2 mg/dL   GFR calc non Af Amer >60 >60 mL/min   GFR calc Af Amer >60 >60 mL/min   Anion gap 17 (H) 5 - 15  Urinalysis, Routine w reflex microscopic (not at [facility])  Result Value Ref Range   Color, Urine YELLOW YELLOW   APPearance CLEAR CLEAR   Specific Gravity, Urine 1.020 1.005 - 1.030   pH 6.0 5.0 - 8.0   Glucose, UA 500 (A) NEGATIVE mg/dL   Hgb urine dipstick TRACE (A) NEGATIVE   Bilirubin Urine NEGATIVE NEGATIVE   Ketones, ur >80 (A) NEGATIVE mg/dL   Protein, ur 100 (A) NEGATIVE mg/dL   Nitrite NEGATIVE NEGATIVE   Leukocytes, UA NEGATIVE NEGATIVE  Urine microscopic-add on  Result Value Ref Range   Squamous Epithelial / LPF 0-5 (A) NONE SEEN   WBC, UA NONE SEEN 0 - 5 WBC/hpf   RBC / HPF 0-5 0 - 5 RBC/hpf   Bacteria, UA NONE SEEN NONE SEEN     Fredia Sorrow, MD 01/30/15 1745

## 2015-01-30 NOTE — ED Notes (Signed)
Pt's daughter reports pt has had n/v/d since Tuesday.  Pt unable to tolerate any food of fluids.  CBG at home was 297.

## 2015-01-30 NOTE — ED Provider Notes (Signed)
CSN: [id number]     Arrival date & time 01/30/15  [id number] History  By signing my name below, I, Lori Mahoney, attest that this documentation has been prepared under the direction and in the presence of Lori Dakin, MD. Electronically Signed: Eustaquio Mahoney, ED Scribe. 01/30/2015. 11:56 AM.   Chief Complaint  Patient presents with  . Emesis  . Diarrhea   The history is provided by the patient. No language interpreter was used.     HPI Comments: Lori Mahoney is a 70 y.o. female who presents to the Emergency Department complaining of gradual onset, constant, nausea, vomiting, and diarrhea x 2 days. Pt reports 8 episodes of diarrhea since onset and vomiting every 1.5 hours. She also complains of , headache, cough, and pain underneath her right breast. Denies fever, chills, abdominal pain, or any other associated symptoms. Pt did receive flu vaccine this year. PSHx hysterectomy. Pt is a non smoker and does not drink EtOH. . No treatment prior to coming here to nothing makes symptoms better or worse. No known fever and she reports  Past Medical History  Diagnosis Date  . Diabetes mellitus   . Hypertension   . Neuropathy ([place])   . GERD (gastroesophageal reflux disease)   . Complication of anesthesia   . PONV (postoperative nausea and vomiting)   . H/O exercise stress test     good result- 2003.  Also had Echocardiogram 10 yrs. ago, told that valve was thickening, but no need for changes , no need for f/u  . Shortness of breath   . H/O hiatal hernia   . Neuromuscular disorder (HCC)     neuropathy- legs & feet   . Arthritis     hands, hips, back   . Anemia     borderline anemia   . Cancer ([place])   . Stroke ([place])   . Gastroparesis    Past Surgical History  Procedure Laterality Date  . Knee surgery    . Abdominal hysterectomy    . Back surgery  02/2009    L3-S1 fusion  . Colonoscopy    . Upper gastrointestinal endoscopy    . Joint replacement  2005    left  . Eye surgery     cataracts removed- R eye    . Stapedectomy      bilateral, then a 2nd time to R ear   Family History  Problem Relation Age of Onset  . Arthritis    . Cancer Brother     prostate  . Colon cancer Mother     age 72  . Breast cancer Sister   . Diabetes Brother    Social History  Substance Use Topics  . Smoking status: Never Smoker   . Smokeless tobacco: Never Used  . Alcohol Use: No   OB History    No data available     Review of Systems  Constitutional: Negative.   Respiratory: Negative.   Cardiovascular: Negative.   Gastrointestinal: Positive for nausea, vomiting and diarrhea. Negative for abdominal pain.  Musculoskeletal: Positive for neck pain.  Skin: Negative.   Neurological: Positive for headaches.  Psychiatric/Behavioral: Negative.    Allergies  Codeine; Hydrocodone-acetaminophen; Meperidine hcl; Contrast media; Oxycodone; and Tetracyclines & related  Home Medications   Prior to Admission medications   Medication Sig Start Date End Date Taking? Authorizing Provider  aspirin EC 81 MG tablet Take 81 mg by mouth daily.   Yes Historical Provider, MD  gabapentin (NEURONTIN) 300 MG capsule Take 1,200  mg by mouth at bedtime.  09/26/10  Yes Historical Provider, MD  glipiZIDE (GLUCOTROL) 10 MG tablet Take 10 mg by mouth 2 (two) times daily before a meal.  08/16/13  Yes Historical Provider, MD  Insulin Detemir (LEVEMIR FLEXPEN) 100 UNIT/ML Pen Inject 20 Units into the skin at bedtime.   Yes Historical Provider, MD  Multiple Vitamins-Minerals (CENTRUM SILVER PO) Take 1 tablet by mouth daily.   Yes Historical Provider, MD  naproxen sodium (ALEVE) 220 MG tablet Take 220 mg by mouth 2 (two) times daily as needed (pain).   Yes Historical Provider, MD  pantoprazole (PROTONIX) 40 MG tablet Take 40 mg by mouth daily.  08/13/13  Yes Historical Provider, MD  pravastatin (PRAVACHOL) 40 MG tablet Take 40 mg by mouth at bedtime.    Yes Historical Provider, MD  verapamil (CALAN-SR) 240 MG CR  tablet Take 240 mg by mouth daily with breakfast.  09/23/10  Yes Historical Provider, MD  docusate sodium (COLACE) 100 MG capsule Take 1 capsule (100 mg total) by mouth daily. Patient not taking: Reported on 11/30/2014 08/21/13   [name], MD  insulin detemir (LEVEMIR) 100 UNIT/ML injection Inject 0.12 mLs (12 Units total) into the skin daily. Patient not taking: Reported on 11/30/2014 06/24/13   [name], MD  lidocaine (LIDODERM) 5 % Place 1 patch onto the skin daily. Remove & Discard patch within 12 hours or as directed by MD Patient not taking: Reported on 11/30/2014 08/21/13   [name], MD  oxyCODONE-acetaminophen (PERCOCET/ROXICET) 5-325 MG tablet Take 1 tablet by mouth every 6 (six) hours as needed. Patient not taking: Reported on 01/30/2015 11/30/14   Milton Ferguson, MD   Triage Vitals:  BP 193/97 mmHg  Pulse 95  Temp(Src) 98.4 F (36.9 C) (Oral)  Resp 18  Ht 5\' 3"  (1.6 m)  Wt 200 lb (90.719 kg)  BMI 35.44 kg/m2  SpO2 97%   Physical Exam  Constitutional: She appears well-developed and well-nourished.  HENT:  Head: Normocephalic and atraumatic.  Eyes: Conjunctivae are normal. Pupils are equal, round, and reactive to light.  Neck: Neck supple. No tracheal deviation present. No thyromegaly present.  Cardiovascular: Normal rate and regular rhythm.   No murmur heard. Pulmonary/Chest: Effort normal and breath sounds normal.  Abdominal: Soft. Bowel sounds are normal. She exhibits no distension. There is no tenderness.  Musculoskeletal: Normal range of motion. She exhibits no edema or tenderness.  Neurological: She is alert. Coordination normal.  Skin: Skin is warm and dry. No rash noted.  Psychiatric: She has a normal mood and affect.  Nursing note and vitals reviewed.   ED Course  Procedures (including critical care time)  DIAGNOSTIC STUDIES: Oxygen Saturation is 97% on RA, normal by my interpretation.    COORDINATION OF CARE: 11:56 AM-Discussed treatment plan  with pt at bedside and pt agreed to plan.   Labs Review Labs Reviewed  CBC WITH DIFFERENTIAL/PLATELET - Abnormal; Notable for the following:    WBC 12.3 (*)    Neutro Abs 9.5 (*)    All other components within normal limits  COMPREHENSIVE METABOLIC PANEL - Abnormal; Notable for the following:    Potassium 3.2 (*)    Chloride 100 (*)    CO2 21 (*)    Glucose, Bld 337 (*)    Total Protein 8.3 (*)    ALT 12 (*)    Anion gap 17 (*)    All other components within normal limits  URINALYSIS, ROUTINE W REFLEX MICROSCOPIC (NOT  AT [facility])    Imaging Review No results found. I have personally reviewed and evaluated these lab results as part of my medical decision-making.   EKG Interpretation None     3:55 PM patient reports that she vomited again after treatment with intravenous Reglan and intravenous fluids. Intravenous Zofran ordered. xrays viewed by me Results for orders placed or performed during the hospital encounter of 01/30/15  CBC with Differential  Result Value Ref Range   WBC 12.3 (H) 4.0 - 10.5 K/uL   RBC 4.68 3.87 - 5.11 MIL/uL   Hemoglobin 13.6 12.0 - 15.0 g/dL   HCT 40.7 36.0 - 46.0 %   MCV 87.0 78.0 - 100.0 fL   MCH 29.1 26.0 - 34.0 pg   MCHC 33.4 30.0 - 36.0 g/dL   RDW 13.5 11.5 - 15.5 %   Platelets 255 150 - 400 K/uL   Neutrophils Relative % 78 %   Neutro Abs 9.5 (H) 1.7 - 7.7 K/uL   Lymphocytes Relative 17 %   Lymphs Abs 2.1 0.7 - 4.0 K/uL   Monocytes Relative 5 %   Monocytes Absolute 0.6 0.1 - 1.0 K/uL   Eosinophils Relative 0 %   Eosinophils Absolute 0.0 0.0 - 0.7 K/uL   Basophils Relative 0 %   Basophils Absolute 0.0 0.0 - 0.1 K/uL  Comprehensive metabolic panel  Result Value Ref Range   Sodium 138 135 - 145 mmol/L   Potassium 3.2 (L) 3.5 - 5.1 mmol/L   Chloride 100 (L) 101 - 111 mmol/L   CO2 21 (L) 22 - 32 mmol/L   Glucose, Bld 337 (H) 65 - 99 mg/dL   BUN 13 6 - 20 mg/dL   Creatinine, Ser 0.91 0.44 - 1.00 mg/dL   Calcium 9.4 8.9 - 10.3 mg/dL    Total Protein 8.3 (H) 6.5 - 8.1 g/dL   Albumin 3.9 3.5 - 5.0 g/dL   AST 20 15 - 41 U/L   ALT 12 (L) 14 - 54 U/L   Alkaline Phosphatase 118 38 - 126 U/L   Total Bilirubin 0.9 0.3 - 1.2 mg/dL   GFR calc non Af Amer >60 >60 mL/min   GFR calc Af Amer >60 >60 mL/min   Anion gap 17 (H) 5 - 15  Urinalysis, Routine w reflex microscopic (not at [facility])  Result Value Ref Range   Color, Urine YELLOW YELLOW   APPearance CLEAR CLEAR   Specific Gravity, Urine 1.020 1.005 - 1.030   pH 6.0 5.0 - 8.0   Glucose, UA 500 (A) NEGATIVE mg/dL   Hgb urine dipstick TRACE (A) NEGATIVE   Bilirubin Urine NEGATIVE NEGATIVE   Ketones, ur >80 (A) NEGATIVE mg/dL   Protein, ur 100 (A) NEGATIVE mg/dL   Nitrite NEGATIVE NEGATIVE   Leukocytes, UA NEGATIVE NEGATIVE  Urine microscopic-add on  Result Value Ref Range   Squamous Epithelial / LPF 0-5 (A) NONE SEEN   WBC, UA NONE SEEN 0 - 5 WBC/hpf   RBC / HPF 0-5 0 - 5 RBC/hpf   Bacteria, UA NONE SEEN NONE SEEN   Dg Abd Acute W/chest  01/30/2015  CLINICAL DATA:  Complaining of gradual onset, constant, nausea, vomiting, and diarrhea x 2 days. Pt reports 8 episodes of diarrhea since onset and vomiting every 1.5 hours. She also complains of , headache, cough, and pain underneath her right breast. EXAM: DG ABDOMEN ACUTE W/ 1V CHEST COMPARISON:  None. FINDINGS: There is no evidence of dilated bowel loops or free intraperitoneal air. No  radiopaque calculi or other significant radiographic abnormality is seen. Heart size and mediastinal contours are within normal limits. Both lungs are clear. Posterior spinal fusion hardware. Bilateral shoulder arthroplasties. IMPRESSION: Negative abdominal radiographs.  No acute cardiopulmonary disease. Electronically Signed   By: Kathreen Devoid   On: 01/30/2015 12:48    MDM  Patient will require additional hydration and recheck electrolytes. She is clinically dehydrated. Electrolytes consistent with mild diabetic ketoacidosis. Patient signed out to  Dr.Zackowski at 4pm Final diagnoses:  None   Dx #1 nausea vomiting and diarrhea #2 dehydration #3 hyperglycemia  #4hypokalemia    Lori Dakin, MD 01/30/15 [phone]

## 2015-01-31 DIAGNOSIS — I10 Essential (primary) hypertension: Secondary | ICD-10-CM

## 2015-01-31 DIAGNOSIS — E1310 Other specified diabetes mellitus with ketoacidosis without coma: Secondary | ICD-10-CM

## 2015-01-31 DIAGNOSIS — K3184 Gastroparesis: Secondary | ICD-10-CM

## 2015-01-31 DIAGNOSIS — R112 Nausea with vomiting, unspecified: Secondary | ICD-10-CM

## 2015-01-31 DIAGNOSIS — E876 Hypokalemia: Secondary | ICD-10-CM

## 2015-01-31 DIAGNOSIS — F329 Major depressive disorder, single episode, unspecified: Secondary | ICD-10-CM

## 2015-01-31 LAB — COMPREHENSIVE METABOLIC PANEL
ALBUMIN: 3.4 g/dL — AB (ref 3.5–5.0)
ALK PHOS: 96 U/L (ref 38–126)
ALT: 11 U/L — AB (ref 14–54)
AST: 15 U/L (ref 15–41)
Anion gap: 9 (ref 5–15)
BUN: 14 mg/dL (ref 6–20)
CALCIUM: 8.8 mg/dL — AB (ref 8.9–10.3)
CHLORIDE: 108 mmol/L (ref 101–111)
CO2: 26 mmol/L (ref 22–32)
CREATININE: 0.66 mg/dL (ref 0.44–1.00)
GFR calc non Af Amer: >60 mL/min (ref >60)
GLUCOSE: 247 mg/dL — AB (ref 65–99)
Potassium: 3.2 mmol/L — ABNORMAL LOW (ref 3.5–5.1)
SODIUM: 143 mmol/L (ref 135–145)
Total Bilirubin: 0.5 mg/dL (ref 0.3–1.2)
Total Protein: 6.8 g/dL (ref 6.5–8.1)

## 2015-01-31 LAB — GLUCOSE, CAPILLARY
GLUCOSE-CAPILLARY: 202 mg/dL — AB (ref 65–99)
GLUCOSE-CAPILLARY: 198 mg/dL — AB (ref 65–99)
Glucose-Capillary: 178 mg/dL — ABNORMAL HIGH (ref 65–99)
Glucose-Capillary: 184 mg/dL — ABNORMAL HIGH (ref 65–99)

## 2015-01-31 LAB — CBC
HCT: 36.2 % (ref 36.0–46.0)
HEMOGLOBIN: 11.7 g/dL — AB (ref 12.0–15.0)
MCH: 28.7 pg (ref 26.0–34.0)
MCHC: 32.3 g/dL (ref 30.0–36.0)
MCV: 88.7 fL (ref 78.0–100.0)
PLATELETS: 221 10*3/uL (ref 150–400)
RBC: 4.08 MIL/uL (ref 3.87–5.11)
RDW: 13.4 % (ref 11.5–15.5)
WBC: 12.1 10*3/uL — ABNORMAL HIGH (ref 4.0–10.5)

## 2015-01-31 MED ORDER — INSULIN DETEMIR 100 UNIT/ML ~~LOC~~ SOLN
20.0000 [IU] | Freq: Every day | SUBCUTANEOUS | Status: DC
  Administered 2015-01-31: 20 [IU] via SUBCUTANEOUS
  Filled 2015-01-31 (×3): qty 0.2

## 2015-01-31 MED ORDER — METOCLOPRAMIDE HCL 5 MG/ML IJ SOLN
10.0000 mg | Freq: Three times a day (TID) | INTRAMUSCULAR | Status: DC
  Administered 2015-01-31 – 2015-02-06 (×22): 10 mg via INTRAVENOUS
  Filled 2015-01-31 (×30): qty 2

## 2015-01-31 MED ORDER — FLEET ENEMA 7-19 GM/118ML RE ENEM
1.0000 | ENEMA | Freq: Once | RECTAL | Status: AC
  Administered 2015-01-31: 1 via RECTAL

## 2015-01-31 MED ORDER — LORAZEPAM 2 MG/ML IJ SOLN
1.0000 mg | Freq: Four times a day (QID) | INTRAMUSCULAR | Status: AC
  Administered 2015-01-31 (×2): 1 mg via INTRAVENOUS
  Filled 2015-01-31 (×2): qty 1

## 2015-01-31 MED ORDER — POTASSIUM CHLORIDE 10 MEQ/100ML IV SOLN
10.0000 meq | INTRAVENOUS | Status: AC
  Administered 2015-01-31 (×4): 10 meq via INTRAVENOUS
  Filled 2015-01-31: qty 100

## 2015-01-31 MED ORDER — PANTOPRAZOLE SODIUM 40 MG IV SOLR
40.0000 mg | INTRAVENOUS | Status: DC
Start: 2015-01-31 — End: 2015-02-01
  Administered 2015-01-31 – 2015-02-01 (×2): 40 mg via INTRAVENOUS
  Filled 2015-01-31 (×2): qty 40

## 2015-01-31 MED ORDER — INSULIN ASPART 100 UNIT/ML ~~LOC~~ SOLN
0.0000 [IU] | Freq: Three times a day (TID) | SUBCUTANEOUS | Status: DC
  Administered 2015-01-31: 4 [IU] via SUBCUTANEOUS
  Administered 2015-01-31: 7 [IU] via SUBCUTANEOUS
  Administered 2015-01-31: 4 [IU] via SUBCUTANEOUS
  Administered 2015-02-01: 3 [IU] via SUBCUTANEOUS
  Administered 2015-02-01: 4 [IU] via SUBCUTANEOUS
  Administered 2015-02-01 – 2015-02-03 (×6): 3 [IU] via SUBCUTANEOUS
  Administered 2015-02-04 (×2): 4 [IU] via SUBCUTANEOUS
  Administered 2015-02-04: 3 [IU] via SUBCUTANEOUS
  Administered 2015-02-06: 7 [IU] via SUBCUTANEOUS
  Filled 2015-01-31 (×22): qty 0.2

## 2015-01-31 MED ORDER — INSULIN ASPART 100 UNIT/ML ~~LOC~~ SOLN
0.0000 [IU] | Freq: Every day | SUBCUTANEOUS | Status: DC
  Filled 2015-01-31 (×8): qty 0.05

## 2015-01-31 NOTE — Consult Note (Signed)
Referring Provider: Alonza Bogus, MD  Primary Care Physician:  [name], MD Primary Gastroenterologist:  Dr. Laural Golden  Reason for Consultation:    Nausea and vomiting.  HPI:   Patient is 70 year old Caucasian female was history of GERD and diabetic gastroparesis he is maintained on PPI was doing well until 3 days prior to admission when she developed nausea and vomiting. She describes her vomiting to be intractable. She did not 1 minute blood. Early on she vomited food debris within thereafter she vomited clear and bilious fluid. She did not experience fever chills abdominal pain melena or rectal bleeding dysuria or vaginal discharge. She was not even able to keep liquids down. She therefore came to emergency room yesterday. Acute abdominal series was unremarkable. She was hospitalized for further management. She was begun on IV pantoprazole and IV metoclopramide. She has been receiving ondansetron for nausea. And she vomited back. She denies headache. She admits to dietary indiscretion. She states her hemoglobin A1c was 8.9 about 6 months ago. She takes Aleve no more than 2 or 3 times a week for back pain. Percocet is listed as one of her when necessary medications but she states she does not take Percocet because it makes her sick. She states she had 2 days of diarrhea with she hasn't had a bowel movement for 4 days. She has not lost any weight recently. She had more or less similar presentation in February 2012 and again 18 months ago. She had upper abdominal ultrasound and HIDA scan with CCK every 2012 and the studies were within normal limits. During that admission she also had esophagogastroduodenoscopy and was also normal. She had upper abdominal ultrasound in May 2015 and was negative for cholelithiasis. She does not smoke cigarettes or drink alcohol. She is retired and lives alone. She lost her husband of lung carcinoma in March 2015 and according to her daughter has been somewhat  depressed. 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 Family history significant for pancreatic carcinoma in a sister who died at 24 and head and neck carcinoma in mother who is 23 but he died. 2 other brothers are disease.    Past Medical History  Diagnosis Date  . Diabetes mellitus   . Hypertension   . Neuropathy ([place])   . GERD (gastroesophageal reflux disease). she had normal EGD on 03/05/2010.    Marland Kitchen Complication of anesthesia   . PONV (postoperative nausea and vomiting)   . H/O exercise stress test     good result- 2003.  Also had Echocardiogram 10 yrs. ago, told that valve was thickening, but no need for changes , no need for f/u  . Shortness of breath   .  last colonoscopy was more than 5 years ago by Dr. Collene Mares in [place].   Marland Kitchen Neuromuscular disorder (HCC)     neuropathy- legs & feet   . Arthritis     hands, hips, back   . Anemia     borderline anemia   . Cancer ([place])   . Stroke ([place])   . Gastroparesis. she had normal gastric emptying study in [place] 2012 but abnormal study in May 2015.      Past Surgical History  Procedure Laterality Date  . Knee surgery    . Abdominal hysterectomy    . Back surgery  02/2009    L3-S1 fusion  . Colonoscopy    . Upper gastrointestinal endoscopy    . Joint replacement  2005    left  .  Eye surgery      cataracts removed- R eye    . Stapedectomy      bilateral, then a 2nd time to R ear    Prior to Admission medications   Medication Sig Start Date End Date Taking? Authorizing Provider  aspirin EC 81 MG tablet Take 81 mg by mouth daily.   Yes Historical Provider, MD  gabapentin (NEURONTIN) 300 MG capsule Take 1,200 mg by mouth at bedtime.  09/26/10  Yes Historical Provider, MD  glipiZIDE (GLUCOTROL) 10 MG tablet Take 10 mg by mouth 2 (two) times daily before a meal.  08/16/13  Yes Historical Provider, MD  Insulin Detemir (LEVEMIR FLEXPEN) 100 UNIT/ML Pen Inject 20 Units into the skin at bedtime.   Yes Historical  Provider, MD  Multiple Vitamins-Minerals (CENTRUM SILVER PO) Take 1 tablet by mouth daily.   Yes Historical Provider, MD  naproxen sodium (ALEVE) 220 MG tablet Take 220 mg by mouth 2 (two) times daily as needed (pain).   Yes Historical Provider, MD  pantoprazole (PROTONIX) 40 MG tablet Take 40 mg by mouth daily.  08/13/13  Yes Historical Provider, MD  pravastatin (PRAVACHOL) 40 MG tablet Take 40 mg by mouth at bedtime.    Yes Historical Provider, MD  verapamil (CALAN-SR) 240 MG CR tablet Take 240 mg by mouth daily with breakfast.  09/23/10  Yes Historical Provider, MD  docusate sodium (COLACE) 100 MG capsule Take 1 capsule (100 mg total) by mouth daily. Patient not taking: Reported on 11/30/2014 08/21/13   [name], MD  insulin detemir (LEVEMIR) 100 UNIT/ML injection Inject 0.12 mLs (12 Units total) into the skin daily. Patient not taking: Reported on 11/30/2014 06/24/13   [name], MD  lidocaine (LIDODERM) 5 % Place 1 patch onto the skin daily. Remove & Discard patch within 12 hours or as directed by MD Patient not taking: Reported on 11/30/2014 08/21/13   [name], MD  oxyCODONE-acetaminophen (PERCOCET/ROXICET) 5-325 MG tablet Take 1 tablet by mouth every 6 (six) hours as needed. Patient not taking: Reported on 01/30/2015 11/30/14   Milton Ferguson, MD    Current Facility-Administered Medications  Medication Dose Route Frequency Provider Last Rate Last Dose  . 0.9 %  sodium chloride infusion   Intravenous Continuous Doree Albee, MD 125 mL/hr at 01/31/15 1500    . enoxaparin (LOVENOX) injection 40 mg  40 mg Subcutaneous Q24H Nimish C Anastasio Champion, MD   40 mg at 01/30/15 2203  . hydrALAZINE (APRESOLINE) injection 5 mg  5 mg Intravenous Q4H PRN Nimish C Gosrani, MD      . insulin aspart (novoLOG) injection 0-20 Units  0-20 Units Subcutaneous TID WC [name], MD   4 Units at 01/31/15 1202  . insulin aspart (novoLOG) injection 0-5 Units  0-5 Units Subcutaneous QHS [name], MD       . insulin detemir (LEVEMIR) injection 20 Units  20 Units Subcutaneous QHS [name], MD      . LORazepam (ATIVAN) injection 1 mg  1 mg Intravenous Q6H [name], MD      . metoCLOPramide (REGLAN) injection 10 mg  10 mg Intravenous TID AC & HS [name], MD      . ondansetron (ZOFRAN) tablet 4 mg  4 mg Oral Q6H PRN Nimish Luther Parody, MD       Or  . ondansetron (ZOFRAN) injection 4 mg  4 mg Intravenous Q6H PRN Doree Albee, MD   4 mg at 01/31/15 1202  .  pantoprazole (PROTONIX) injection 40 mg  40 mg Intravenous Q24H [name], MD   40 mg at 01/31/15 0848  . sodium chloride 0.9 % injection 3 mL  3 mL Intravenous Q12H Doree Albee, MD   3 mL at 01/30/15 2205  . verapamil (CALAN-SR) CR tablet 240 mg  240 mg Oral Q breakfast Doree Albee, MD   240 mg at 01/31/15 0852    Allergies as of 01/30/2015 - Review Complete 01/30/2015  Allergen Reaction Noted  . Codeine Nausea And Vomiting   . Hydrocodone-acetaminophen Nausea And Vomiting 01/06/2009  . Meperidine hcl Nausea And Vomiting   . Contrast media [iodinated diagnostic agents] Nausea And Vomiting 09/02/2011  . Oxycodone Nausea And Vomiting 09/02/2011  . Tetracyclines & related Rash 06/21/2013    Family History  Problem Relation Age of Onset  . Arthritis    . Cancer Brother     prostate  . Colon cancer Mother     age 60  . Breast cancer Sister   . Diabetes Brother     Social History   Social History  . Marital Status: Married    Spouse Name: N/A  . Number of Children: N/A  . Years of Education: N/A   Occupational History  . retired    Social History Main Topics  . Smoking status: Never Smoker   . Smokeless tobacco: Never Used  . Alcohol Use: No  . Drug Use: No  . Sexual Activity: Not Currently   Other Topics Concern  . Not on file   Social History Narrative    Review of Systems: See HPI, otherwise normal ROS  Physical Exam: Temp:  [97.3 F (36.3 C)-99 F (37.2 C)] 98.1 F (36.7  C) (01/06 1600) Pulse Rate:  [51-108] 87 (01/06 1500) Resp:  [10-28] 22 (01/06 1500) BP: (148-189)/(65-97) 176/87 mmHg (01/06 1500) SpO2:  [87 %-99 %] 94 % (01/06 1500) Weight:  [191 lb 9.3 oz (86.9 kg)] 191 lb 9.3 oz (86.9 kg) (01/05 1900) Last BM Date: 01/29/15  Patient is alert and in no acute distress. Conjunctiva was pink. Sclerae nonicteric.  Oropharyngeal mucosa is normal. Neck is supple. Cardiac exam with regular rhythm. Normal S1 and S2. No murmur or gallop noted. Lungs are clear to auscultation. Abdomen is full. Bowel sounds are normal. On palpation abdomen is soft and nontender without organomegaly or masses. No peripheral edema or clubbing noted.    Lab Results:  Recent Labs  01/30/15 1055 01/31/15 0442  WBC 12.3* 12.1*  HGB 13.6 11.7*  HCT 40.7 36.2  PLT 255 221   BMET  Recent Labs  01/30/15 1055 01/31/15 0442  NA 138 143  K 3.2* 3.2*  CL 100* 108  CO2 21* 26  GLUCOSE 337* 247*  BUN 13 14  CREATININE 0.91 0.66  CALCIUM 9.4 8.8*   LFT  Recent Labs  01/31/15 0442  PROT 6.8  ALBUMIN 3.4*  AST 15  ALT 11*  ALKPHOS 96  BILITOT 0.5   PT/INR No results for input(s): LABPROT, INR in the last 72 hours. Hepatitis Panel No results for input(s): HEPBSAG, HCVAB, HEPAIGM, HEPBIGM in the last 72 hours.  Studies/Results: Dg Abd Acute W/chest  01/30/2015  CLINICAL DATA:  Complaining of gradual onset, constant, nausea, vomiting, and diarrhea x 2 days. Pt reports 8 episodes of diarrhea since onset and vomiting every 1.5 hours. She also complains of , headache, cough, and pain underneath her right breast. EXAM: DG ABDOMEN ACUTE W/ 1V CHEST COMPARISON:  None.  FINDINGS: There is no evidence of dilated bowel loops or free intraperitoneal air. No radiopaque calculi or other significant radiographic abnormality is seen. Heart size and mediastinal contours are within normal limits. Both lungs are clear. Posterior spinal fusion hardware. Bilateral shoulder  arthroplasties. IMPRESSION: Negative abdominal radiographs.  No acute cardiopulmonary disease. Electronically Signed   By: Kathreen Devoid   On: 01/30/2015 12:48    Assessment; Patient is 70 year old Caucasian female who presents with acute onset of nausea and vomiting. On admission she had positive ketones in her urine and elevated a non-gap consistent with mild DKA. Therefore her nausea and vomiting but appear to be excess ablation of her diabetic gastroparesis which was diagnosed on gastric emptying study of May 2015. She appears to be improving with current therapy. If symptoms persist would consider further evaluation with EGD and ultrasound of upper abdomen. It appears diabetes is poorly controlled and she may benefit from podiatry consultation.   Recommendations; Will give her 2 doses of lorazepam today 1 mg IV 6 hours apart. Change metoclopramide to 10 mg IV before meals and daily at bedtime. Hemoglobin A1c in a.m. Fleet enema tonight. Consider dietary consultation.  Dr. Gala Romney will be seen patient over the weekend during my absence.   LOS: 1 day   Hebe Merriwether U  01/31/2015, 4:34 PM

## 2015-01-31 NOTE — Progress Notes (Signed)
Patient transported to room 314 via wheelchair. No distress noted report given to receiving nurse on unit 300.

## 2015-01-31 NOTE — Care Management Important Message (Signed)
Important Message  Patient Details  Name: Theadora C Martinique MRN: [id number] Date of Birth: 27-Feb-1945   Medicare Important Message Given:  Yes    Joylene Draft, RN 01/31/2015, 4:01 PM

## 2015-01-31 NOTE — Progress Notes (Signed)
Subjective: She was admitted yesterday with intractable nausea and vomiting probably from gastroparesis. She is still having trouble. Her blood sugar was uncontrolled but is better.  Objective: Vital signs in last 24 hours: Temp:  [97.3 F (36.3 C)-99 F (37.2 C)] 97.3 F (36.3 C) (01/06 0600) Pulse Rate:  [46-115] 90 (01/06 0800) Resp:  [10-28] 21 (01/06 0800) BP: (158-194)/(64-102) 189/96 mmHg (01/06 0852) SpO2:  [89 %-99 %] 95 % (01/06 0800) Weight:  [86.9 kg (191 lb 9.3 oz)-90.719 kg (200 lb)] 86.9 kg (191 lb 9.3 oz) (01/05 1900) Weight change:  Last BM Date: 01/29/15  Intake/Output from previous day: 01/05 0701 - 01/06 0700 In: 3000 [I.V.:3000] Out: 207 [Urine:200; Emesis/NG output:7]  PHYSICAL EXAM General appearance: alert, cooperative and moderate distress Resp: clear to auscultation bilaterally Cardio: regular rate and rhythm, S1, S2 normal, no murmur, click, rub or gallop GI: Mildly diffusely tender and she is gagging during the exam Extremities: extremities normal, atraumatic, no cyanosis or edema  Lab Results:  Results for orders placed or performed during the hospital encounter of 01/30/15 (from the past 48 hour(s))  CBC with Differential     Status: Abnormal   Collection Time: 01/30/15 10:55 AM  Result Value Ref Range   WBC 12.3 (H) 4.0 - 10.5 K/uL   RBC 4.68 3.87 - 5.11 MIL/uL   Hemoglobin 13.6 12.0 - 15.0 g/dL   HCT 40.7 36.0 - 46.0 %   MCV 87.0 78.0 - 100.0 fL   MCH 29.1 26.0 - 34.0 pg   MCHC 33.4 30.0 - 36.0 g/dL   RDW 13.5 11.5 - 15.5 %   Platelets 255 150 - 400 K/uL   Neutrophils Relative % 78 %   Neutro Abs 9.5 (H) 1.7 - 7.7 K/uL   Lymphocytes Relative 17 %   Lymphs Abs 2.1 0.7 - 4.0 K/uL   Monocytes Relative 5 %   Monocytes Absolute 0.6 0.1 - 1.0 K/uL   Eosinophils Relative 0 %   Eosinophils Absolute 0.0 0.0 - 0.7 K/uL   Basophils Relative 0 %   Basophils Absolute 0.0 0.0 - 0.1 K/uL  Comprehensive metabolic panel     Status: Abnormal   Collection Time: 01/30/15 10:55 AM  Result Value Ref Range   Sodium 138 135 - 145 mmol/L   Potassium 3.2 (L) 3.5 - 5.1 mmol/L   Chloride 100 (L) 101 - 111 mmol/L   CO2 21 (L) 22 - 32 mmol/L   Glucose, Bld 337 (H) 65 - 99 mg/dL   BUN 13 6 - 20 mg/dL   Creatinine, Ser 0.91 0.44 - 1.00 mg/dL   Calcium 9.4 8.9 - 10.3 mg/dL   Total Protein 8.3 (H) 6.5 - 8.1 g/dL   Albumin 3.9 3.5 - 5.0 g/dL   AST 20 15 - 41 U/L   ALT 12 (L) 14 - 54 U/L   Alkaline Phosphatase 118 38 - 126 U/L   Total Bilirubin 0.9 0.3 - 1.2 mg/dL   GFR calc non Af Amer >60 >60 mL/min   GFR calc Af Amer >60 >60 mL/min    Comment: (NOTE) The eGFR has been calculated using the CKD EPI equation. This calculation has not been validated in all clinical situations. eGFR's persistently <60 mL/min signify possible Chronic Kidney Disease.    Anion gap 17 (H) 5 - 15  Urinalysis, Routine w reflex microscopic (not at [facility])     Status: Abnormal   Collection Time: 01/30/15 11:53 AM  Result Value Ref Range   Color,  Urine YELLOW YELLOW   APPearance CLEAR CLEAR   Specific Gravity, Urine 1.020 1.005 - 1.030   pH 6.0 5.0 - 8.0   Glucose, UA 500 (A) NEGATIVE mg/dL   Hgb urine dipstick TRACE (A) NEGATIVE   Bilirubin Urine NEGATIVE NEGATIVE   Ketones, ur >80 (A) NEGATIVE mg/dL   Protein, ur 100 (A) NEGATIVE mg/dL   Nitrite NEGATIVE NEGATIVE   Leukocytes, UA NEGATIVE NEGATIVE  Urine microscopic-add on     Status: Abnormal   Collection Time: 01/30/15 11:53 AM  Result Value Ref Range   Squamous Epithelial / LPF 0-5 (A) NONE SEEN   WBC, UA NONE SEEN 0 - 5 WBC/hpf   RBC / HPF 0-5 0 - 5 RBC/hpf   Bacteria, UA NONE SEEN NONE SEEN  CBG monitoring, ED     Status: Abnormal   Collection Time: 01/30/15  6:07 PM  Result Value Ref Range   Glucose-Capillary 241 (H) 65 - 99 mg/dL  CBG monitoring, ED     Status: Abnormal   Collection Time: 01/30/15  6:39 PM  Result Value Ref Range   Glucose-Capillary 283 (H) 65 - 99 mg/dL  MRSA PCR Screening      Status: None   Collection Time: 01/30/15  6:40 PM  Result Value Ref Range   MRSA by PCR NEGATIVE NEGATIVE    Comment:        The GeneXpert MRSA Assay (FDA approved for NASAL specimens only), is one component of a comprehensive MRSA colonization surveillance program. It is not intended to diagnose MRSA infection nor to guide or monitor treatment for MRSA infections.   Glucose, capillary     Status: Abnormal   Collection Time: 01/30/15  9:30 PM  Result Value Ref Range   Glucose-Capillary 205 (H) 65 - 99 mg/dL  Comprehensive metabolic panel     Status: Abnormal   Collection Time: 01/31/15  4:42 AM  Result Value Ref Range   Sodium 143 135 - 145 mmol/L   Potassium 3.2 (L) 3.5 - 5.1 mmol/L   Chloride 108 101 - 111 mmol/L   CO2 26 22 - 32 mmol/L   Glucose, Bld 247 (H) 65 - 99 mg/dL   BUN 14 6 - 20 mg/dL   Creatinine, Ser 0.66 0.44 - 1.00 mg/dL   Calcium 8.8 (L) 8.9 - 10.3 mg/dL   Total Protein 6.8 6.5 - 8.1 g/dL   Albumin 3.4 (L) 3.5 - 5.0 g/dL   AST 15 15 - 41 U/L   ALT 11 (L) 14 - 54 U/L   Alkaline Phosphatase 96 38 - 126 U/L   Total Bilirubin 0.5 0.3 - 1.2 mg/dL   GFR calc non Af Amer >60 >60 mL/min   GFR calc Af Amer >60 >60 mL/min    Comment: (NOTE) The eGFR has been calculated using the CKD EPI equation. This calculation has not been validated in all clinical situations. eGFR's persistently <60 mL/min signify possible Chronic Kidney Disease.    Anion gap 9 5 - 15  CBC     Status: Abnormal   Collection Time: 01/31/15  4:42 AM  Result Value Ref Range   WBC 12.1 (H) 4.0 - 10.5 K/uL   RBC 4.08 3.87 - 5.11 MIL/uL   Hemoglobin 11.7 (L) 12.0 - 15.0 g/dL   HCT 36.2 36.0 - 46.0 %   MCV 88.7 78.0 - 100.0 fL   MCH 28.7 26.0 - 34.0 pg   MCHC 32.3 30.0 - 36.0 g/dL   RDW 13.4 11.5 -  15.5 %   Platelets 221 150 - 400 K/uL  Glucose, capillary     Status: Abnormal   Collection Time: 01/31/15  7:29 AM  Result Value Ref Range   Glucose-Capillary 202 (H) 65 - 99 mg/dL    Comment 1 Notify RN     ABGS No results for input(s): PHART, PO2ART, TCO2, HCO3 in the last 72 hours.  Invalid input(s): PCO2 CULTURES Recent Results (from the past 240 hour(s))  MRSA PCR Screening     Status: None   Collection Time: 01/30/15  6:40 PM  Result Value Ref Range Status   MRSA by PCR NEGATIVE NEGATIVE Final    Comment:        The GeneXpert MRSA Assay (FDA approved for NASAL specimens only), is one component of a comprehensive MRSA colonization surveillance program. It is not intended to diagnose MRSA infection nor to guide or monitor treatment for MRSA infections.    Studies/Results: Dg Abd Acute W/chest  01/30/2015  CLINICAL DATA:  Complaining of gradual onset, constant, nausea, vomiting, and diarrhea x 2 days. Pt reports 8 episodes of diarrhea since onset and vomiting every 1.5 hours. She also complains of , headache, cough, and pain underneath her right breast. EXAM: DG ABDOMEN ACUTE W/ 1V CHEST COMPARISON:  None. FINDINGS: There is no evidence of dilated bowel loops or free intraperitoneal air. No radiopaque calculi or other significant radiographic abnormality is seen. Heart size and mediastinal contours are within normal limits. Both lungs are clear. Posterior spinal fusion hardware. Bilateral shoulder arthroplasties. IMPRESSION: Negative abdominal radiographs.  No acute cardiopulmonary disease. Electronically Signed   By: Kathreen Devoid   On: 01/30/2015 12:48    Medications:  Prior to Admission:  Prescriptions prior to admission  Medication Sig Dispense Refill Last Dose  . aspirin EC 81 MG tablet Take 81 mg by mouth daily.   01/29/2015 at Unknown time  . gabapentin (NEURONTIN) 300 MG capsule Take 1,200 mg by mouth at bedtime.    01/28/2015  . glipiZIDE (GLUCOTROL) 10 MG tablet Take 10 mg by mouth 2 (two) times daily before a meal.    01/29/2015 at Unknown time  . Insulin Detemir (LEVEMIR FLEXPEN) 100 UNIT/ML Pen Inject 20 Units into the skin at bedtime.   01/29/2015 at  Unknown time  . Multiple Vitamins-Minerals (CENTRUM SILVER PO) Take 1 tablet by mouth daily.   01/28/2015  . naproxen sodium (ALEVE) 220 MG tablet Take 220 mg by mouth 2 (two) times daily as needed (pain).   01/27/2015  . pantoprazole (PROTONIX) 40 MG tablet Take 40 mg by mouth daily.    01/29/2015 at Unknown time  . pravastatin (PRAVACHOL) 40 MG tablet Take 40 mg by mouth at bedtime.    01/28/2015  . verapamil (CALAN-SR) 240 MG CR tablet Take 240 mg by mouth daily with breakfast.    01/29/2015 at Unknown time  . docusate sodium (COLACE) 100 MG capsule Take 1 capsule (100 mg total) by mouth daily. (Patient not taking: Reported on 11/30/2014) 1 capsule 0   . insulin detemir (LEVEMIR) 100 UNIT/ML injection Inject 0.12 mLs (12 Units total) into the skin daily. (Patient not taking: Reported on 11/30/2014) 10 mL 11 Taking  . lidocaine (LIDODERM) 5 % Place 1 patch onto the skin daily. Remove & Discard patch within 12 hours or as directed by MD (Patient not taking: Reported on 11/30/2014) 30 patch 1   . oxyCODONE-acetaminophen (PERCOCET/ROXICET) 5-325 MG tablet Take 1 tablet by mouth every 6 (six) hours as needed. (  Patient not taking: Reported on 01/30/2015) 30 tablet 0 Not Taking at Unknown time   Scheduled: . enoxaparin (LOVENOX) injection  40 mg Subcutaneous Q24H  . insulin aspart  0-20 Units Subcutaneous TID WC  . insulin aspart  0-5 Units Subcutaneous QHS  . insulin detemir  20 Units Subcutaneous QHS  . metoCLOPramide (REGLAN) injection  10 mg Intravenous 3 times per day  . pantoprazole (PROTONIX) IV  40 mg Intravenous Q24H  . potassium chloride  10 mEq Intravenous Q1 Hr x 4  . sodium chloride  3 mL Intravenous Q12H  . verapamil  240 mg Oral Q breakfast   Continuous: . sodium chloride 1,000 mL (01/30/15 1700)   RFV:[id number], ondansetron **OR** ondansetron (ZOFRAN) IV  Assesment: She has intractable nausea and vomiting. There was concern that she might be developing diabetic ketoacidosis but that does  not seem to be the case. She still having trouble with nausea and vomiting and I think it's from gastroparesis. I don't think she needs to be in stepdown now Active Problems:   Diabetes [facility])   Essential hypertension   Gastroparesis    Plan: Transfer to floor continue treatment I have requested GI consultation    LOS: 1 day   Oceana Walthall L 01/31/2015, 9:07 AM

## 2015-02-01 DIAGNOSIS — K3184 Gastroparesis: Secondary | ICD-10-CM

## 2015-02-01 DIAGNOSIS — R1110 Vomiting, unspecified: Secondary | ICD-10-CM

## 2015-02-01 LAB — BASIC METABOLIC PANEL
Anion gap: 7 (ref 5–15)
BUN: 14 mg/dL (ref 6–20)
CHLORIDE: 107 mmol/L (ref 101–111)
CO2: 26 mmol/L (ref 22–32)
CREATININE: 0.71 mg/dL (ref 0.44–1.00)
Calcium: 8.5 mg/dL — ABNORMAL LOW (ref 8.9–10.3)
GFR calc Af Amer: >60 mL/min (ref >60)
GFR calc non Af Amer: >60 mL/min (ref >60)
Glucose, Bld: 139 mg/dL — ABNORMAL HIGH (ref 65–99)
Potassium: 2.5 mmol/L — CL (ref 3.5–5.1)
SODIUM: 140 mmol/L (ref 135–145)

## 2015-02-01 LAB — GLUCOSE, CAPILLARY
GLUCOSE-CAPILLARY: 179 mg/dL — AB (ref 65–99)
GLUCOSE-CAPILLARY: 136 mg/dL — AB (ref 65–99)
Glucose-Capillary: 129 mg/dL — ABNORMAL HIGH (ref 65–99)
Glucose-Capillary: 144 mg/dL — ABNORMAL HIGH (ref 65–99)

## 2015-02-01 MED ORDER — INSULIN DETEMIR 100 UNIT/ML ~~LOC~~ SOLN
25.0000 [IU] | Freq: Every day | SUBCUTANEOUS | Status: DC
  Administered 2015-02-01 – 2015-02-05 (×5): 25 [IU] via SUBCUTANEOUS
  Filled 2015-02-01 (×6): qty 0.25

## 2015-02-01 MED ORDER — ACETAMINOPHEN 160 MG/5ML PO SOLN
650.0000 mg | Freq: Four times a day (QID) | ORAL | Status: DC | PRN
  Administered 2015-02-01: 650 mg via ORAL
  Filled 2015-02-01 (×2): qty 20.3

## 2015-02-01 MED ORDER — POTASSIUM CHLORIDE CRYS ER 20 MEQ PO TBCR
40.0000 meq | EXTENDED_RELEASE_TABLET | Freq: Two times a day (BID) | ORAL | Status: DC
  Administered 2015-02-01 – 2015-02-02 (×3): 40 meq via ORAL
  Filled 2015-02-01 (×4): qty 2

## 2015-02-01 MED ORDER — PANTOPRAZOLE SODIUM 40 MG IV SOLR
40.0000 mg | Freq: Two times a day (BID) | INTRAVENOUS | Status: DC
  Administered 2015-02-01 – 2015-02-04 (×6): 40 mg via INTRAVENOUS
  Filled 2015-02-01 (×5): qty 40

## 2015-02-01 MED ORDER — POTASSIUM CHLORIDE 10 MEQ/100ML IV SOLN
10.0000 meq | INTRAVENOUS | Status: AC
  Administered 2015-02-01 (×4): 10 meq via INTRAVENOUS
  Filled 2015-02-01 (×4): qty 100

## 2015-02-01 NOTE — Progress Notes (Signed)
CRITICAL VALUE ALERT  Critical value received:  Potassium 2.5  Date of notification:  02/01/15  Time of notification:  0740  Critical value read back:Yes.    Nurse who received alert:  Tacey Heap RN  MD notified (1st page):  Paged Dr Luan Pulling  Time of first page:  [phone]  Awaiting call back at this time, will continue to monitor pt.

## 2015-02-01 NOTE — Progress Notes (Signed)
Remains nauseated but has not actually vomited. Denies any abdominal pain, whatsoever. Tolerating Reglan.   No results from fleets enema last evening  Vital signs in last 24 hours: Temp:  [98.1 F (36.7 C)-99.1 F (37.3 C)] 99.1 F (37.3 C) (01/07 0552) Pulse Rate:  [69-100] 79 (01/07 0552) Resp:  [16-22] 20 (01/07 0552) BP: (148-184)/(72-97) 161/74 mmHg (01/07 0552) SpO2:  [87 %-95 %] 92 % (01/07 0552) Last BM Date: 01/29/15 General:  Somewhat chronically ill-appearing pleasant  and cooperative in NAD Abdomen:  Nondistended. Positive bowel sounds. No succussion splash. Soft and nontender. Extremities:  Without clubbing or edema.    Intake/Output from previous day: 01/06 0701 - 01/07 0700 In: 3765 [P.O.:340; I.V.:3125; IV Piggyback:300] Out: 2400 [Urine:2400] Intake/Output this shift: Total I/O In: -  Out: 300 [Urine:300]  Lab Results:  Recent Labs  01/30/15 1055 01/31/15 0442  WBC 12.3* 12.1*  HGB 13.6 11.7*  HCT 40.7 36.2  PLT 255 221   BMET  Recent Labs  01/30/15 1055 01/31/15 0442 02/01/15 0604  NA 138 143 140  K 3.2* 3.2* 2.5*  CL 100* 108 107  CO2 21* 26 26  GLUCOSE 337* 247* 139*  BUN 13 14 14   CREATININE 0.91 0.66 0.71  CALCIUM 9.4 8.8* 8.5*   LFT  Recent Labs  01/31/15 0442  PROT 6.8  ALBUMIN 3.4*  AST 15  ALT 11*  ALKPHOS 96  BILITOT 0.5    Impression:   Nausea and vomiting secondary to gastroparesis secondary to poorly controlled diabetes. Likely has an element of reflux esophagitis as well. Hypokalemic  Recommendations:  Leave her on current diet for bnow. Continue IV Reglan. Increase PPI to twice a day.  Management of hypokalemia per attending.

## 2015-02-01 NOTE — Progress Notes (Signed)
Subjective: She still has some nausea but it is better. GI consultation noted and appreciated. Her daughter says that she thinks her mother needs placement because she is just not doing what she needs to do at home.  Objective: Vital signs in last 24 hours: Temp:  [98.1 F (36.7 C)-99.1 F (37.3 C)] 99.1 F (37.3 C) (01/07 0552) Pulse Rate:  [79-100] 79 (01/07 0552) Resp:  [16-22] 20 (01/07 0552) BP: (161-184)/(74-97) 161/74 mmHg (01/07 0552) SpO2:  [87 %-95 %] 92 % (01/07 0552) Weight change:  Last BM Date: 01/29/15  Intake/Output from previous day: 01/06 0701 - 01/07 0700 In: 3765 [P.O.:340; I.V.:3125; IV Piggyback:300] Out: 2400 [Urine:2400]  PHYSICAL EXAM General appearance: alert, cooperative and mild distress Resp: clear to auscultation bilaterally Cardio: regular rate and rhythm, S1, S2 normal, no murmur, click, rub or gallop GI: Mildly tender Extremities: extremities normal, atraumatic, no cyanosis or edema  Lab Results:  Results for orders placed or performed during the hospital encounter of 01/30/15 (from the past 48 hour(s))  Urinalysis, Routine w reflex microscopic (not at [facility])     Status: Abnormal   Collection Time: 01/30/15 11:53 AM  Result Value Ref Range   Color, Urine YELLOW YELLOW   APPearance CLEAR CLEAR   Specific Gravity, Urine 1.020 1.005 - 1.030   pH 6.0 5.0 - 8.0   Glucose, UA 500 (A) NEGATIVE mg/dL   Hgb urine dipstick TRACE (A) NEGATIVE   Bilirubin Urine NEGATIVE NEGATIVE   Ketones, ur >80 (A) NEGATIVE mg/dL   Protein, ur 100 (A) NEGATIVE mg/dL   Nitrite NEGATIVE NEGATIVE   Leukocytes, UA NEGATIVE NEGATIVE  Urine microscopic-add on     Status: Abnormal   Collection Time: 01/30/15 11:53 AM  Result Value Ref Range   Squamous Epithelial / LPF 0-5 (A) NONE SEEN   WBC, UA NONE SEEN 0 - 5 WBC/hpf   RBC / HPF 0-5 0 - 5 RBC/hpf   Bacteria, UA NONE SEEN NONE SEEN  CBG monitoring, ED     Status: Abnormal   Collection Time: 01/30/15  6:07 PM   Result Value Ref Range   Glucose-Capillary 241 (H) 65 - 99 mg/dL  CBG monitoring, ED     Status: Abnormal   Collection Time: 01/30/15  6:39 PM  Result Value Ref Range   Glucose-Capillary 283 (H) 65 - 99 mg/dL  MRSA PCR Screening     Status: None   Collection Time: 01/30/15  6:40 PM  Result Value Ref Range   MRSA by PCR NEGATIVE NEGATIVE    Comment:        The GeneXpert MRSA Assay (FDA approved for NASAL specimens only), is one component of a comprehensive MRSA colonization surveillance program. It is not intended to diagnose MRSA infection nor to guide or monitor treatment for MRSA infections.   Glucose, capillary     Status: Abnormal   Collection Time: 01/30/15  9:30 PM  Result Value Ref Range   Glucose-Capillary 205 (H) 65 - 99 mg/dL  Comprehensive metabolic panel     Status: Abnormal   Collection Time: 01/31/15  4:42 AM  Result Value Ref Range   Sodium 143 135 - 145 mmol/L   Potassium 3.2 (L) 3.5 - 5.1 mmol/L   Chloride 108 101 - 111 mmol/L   CO2 26 22 - 32 mmol/L   Glucose, Bld 247 (H) 65 - 99 mg/dL   BUN 14 6 - 20 mg/dL   Creatinine, Ser 0.66 0.44 - 1.00 mg/dL   Calcium  8.8 (L) 8.9 - 10.3 mg/dL   Total Protein 6.8 6.5 - 8.1 g/dL   Albumin 3.4 (L) 3.5 - 5.0 g/dL   AST 15 15 - 41 U/L   ALT 11 (L) 14 - 54 U/L   Alkaline Phosphatase 96 38 - 126 U/L   Total Bilirubin 0.5 0.3 - 1.2 mg/dL   GFR calc non Af Amer >60 >60 mL/min   GFR calc Af Amer >60 >60 mL/min    Comment: (NOTE) The eGFR has been calculated using the CKD EPI equation. This calculation has not been validated in all clinical situations. eGFR's persistently <60 mL/min signify possible Chronic Kidney Disease.    Anion gap 9 5 - 15  CBC     Status: Abnormal   Collection Time: 01/31/15  4:42 AM  Result Value Ref Range   WBC 12.1 (H) 4.0 - 10.5 K/uL   RBC 4.08 3.87 - 5.11 MIL/uL   Hemoglobin 11.7 (L) 12.0 - 15.0 g/dL   HCT 36.2 36.0 - 46.0 %   MCV 88.7 78.0 - 100.0 fL   MCH 28.7 26.0 - 34.0 pg    MCHC 32.3 30.0 - 36.0 g/dL   RDW 13.4 11.5 - 15.5 %   Platelets 221 150 - 400 K/uL  Glucose, capillary     Status: Abnormal   Collection Time: 01/31/15  7:29 AM  Result Value Ref Range   Glucose-Capillary 202 (H) 65 - 99 mg/dL   Comment 1 Notify RN   Glucose, capillary     Status: Abnormal   Collection Time: 01/31/15 11:37 AM  Result Value Ref Range   Glucose-Capillary 178 (H) 65 - 99 mg/dL  Glucose, capillary     Status: Abnormal   Collection Time: 01/31/15  4:47 PM  Result Value Ref Range   Glucose-Capillary 184 (H) 65 - 99 mg/dL  Glucose, capillary     Status: Abnormal   Collection Time: 01/31/15  8:25 PM  Result Value Ref Range   Glucose-Capillary 198 (H) 65 - 99 mg/dL   Comment 1 Notify RN    Comment 2 Document in Chart   Basic metabolic panel     Status: Abnormal   Collection Time: 02/01/15  6:04 AM  Result Value Ref Range   Sodium 140 135 - 145 mmol/L   Potassium 2.5 (LL) 3.5 - 5.1 mmol/L    Comment: DELTA CHECK NOTED CRITICAL RESULT CALLED TO, READ BACK BY AND VERIFIED WITH: BROWER B. AT 0740A ON [id number] BY THOMPSON S.    Chloride 107 101 - 111 mmol/L   CO2 26 22 - 32 mmol/L   Glucose, Bld 139 (H) 65 - 99 mg/dL   BUN 14 6 - 20 mg/dL   Creatinine, Ser 0.71 0.44 - 1.00 mg/dL   Calcium 8.5 (L) 8.9 - 10.3 mg/dL   GFR calc non Af Amer >60 >60 mL/min   GFR calc Af Amer >60 >60 mL/min    Comment: (NOTE) The eGFR has been calculated using the CKD EPI equation. This calculation has not been validated in all clinical situations. eGFR's persistently <60 mL/min signify possible Chronic Kidney Disease.    Anion gap 7 5 - 15  Glucose, capillary     Status: Abnormal   Collection Time: 02/01/15  7:44 AM  Result Value Ref Range   Glucose-Capillary 129 (H) 65 - 99 mg/dL    ABGS No results for input(s): PHART, PO2ART, TCO2, HCO3 in the last 72 hours.  Invalid input(s): PCO2 CULTURES Recent Results (from the  past 240 hour(s))  MRSA PCR Screening     Status: None    Collection Time: 01/30/15  6:40 PM  Result Value Ref Range Status   MRSA by PCR NEGATIVE NEGATIVE Final    Comment:        The GeneXpert MRSA Assay (FDA approved for NASAL specimens only), is one component of a comprehensive MRSA colonization surveillance program. It is not intended to diagnose MRSA infection nor to guide or monitor treatment for MRSA infections.    Studies/Results: Dg Abd Acute W/chest  01/30/2015  CLINICAL DATA:  Complaining of gradual onset, constant, nausea, vomiting, and diarrhea x 2 days. Pt reports 8 episodes of diarrhea since onset and vomiting every 1.5 hours. She also complains of , headache, cough, and pain underneath her right breast. EXAM: DG ABDOMEN ACUTE W/ 1V CHEST COMPARISON:  None. FINDINGS: There is no evidence of dilated bowel loops or free intraperitoneal air. No radiopaque calculi or other significant radiographic abnormality is seen. Heart size and mediastinal contours are within normal limits. Both lungs are clear. Posterior spinal fusion hardware. Bilateral shoulder arthroplasties. IMPRESSION: Negative abdominal radiographs.  No acute cardiopulmonary disease. Electronically Signed   By: Kathreen Devoid   On: 01/30/2015 12:48    Medications:  Prior to Admission:  Prescriptions prior to admission  Medication Sig Dispense Refill Last Dose  . aspirin EC 81 MG tablet Take 81 mg by mouth daily.   01/29/2015 at Unknown time  . gabapentin (NEURONTIN) 300 MG capsule Take 1,200 mg by mouth at bedtime.    01/28/2015  . glipiZIDE (GLUCOTROL) 10 MG tablet Take 10 mg by mouth 2 (two) times daily before a meal.    01/29/2015 at Unknown time  . Insulin Detemir (LEVEMIR FLEXPEN) 100 UNIT/ML Pen Inject 20 Units into the skin at bedtime.   01/29/2015 at Unknown time  . Multiple Vitamins-Minerals (CENTRUM SILVER PO) Take 1 tablet by mouth daily.   01/28/2015  . naproxen sodium (ALEVE) 220 MG tablet Take 220 mg by mouth 2 (two) times daily as needed (pain).   01/27/2015  .  pantoprazole (PROTONIX) 40 MG tablet Take 40 mg by mouth daily.    01/29/2015 at Unknown time  . pravastatin (PRAVACHOL) 40 MG tablet Take 40 mg by mouth at bedtime.    01/28/2015  . verapamil (CALAN-SR) 240 MG CR tablet Take 240 mg by mouth daily with breakfast.    01/29/2015 at Unknown time  . docusate sodium (COLACE) 100 MG capsule Take 1 capsule (100 mg total) by mouth daily. (Patient not taking: Reported on 11/30/2014) 1 capsule 0   . insulin detemir (LEVEMIR) 100 UNIT/ML injection Inject 0.12 mLs (12 Units total) into the skin daily. (Patient not taking: Reported on 11/30/2014) 10 mL 11 Taking  . lidocaine (LIDODERM) 5 % Place 1 patch onto the skin daily. Remove & Discard patch within 12 hours or as directed by MD (Patient not taking: Reported on 11/30/2014) 30 patch 1   . oxyCODONE-acetaminophen (PERCOCET/ROXICET) 5-325 MG tablet Take 1 tablet by mouth every 6 (six) hours as needed. (Patient not taking: Reported on 01/30/2015) 30 tablet 0 Not Taking at Unknown time   Scheduled: . enoxaparin (LOVENOX) injection  40 mg Subcutaneous Q24H  . insulin aspart  0-20 Units Subcutaneous TID WC  . insulin aspart  0-5 Units Subcutaneous QHS  . insulin detemir  25 Units Subcutaneous QHS  . metoCLOPramide (REGLAN) injection  10 mg Intravenous TID AC & HS  . pantoprazole (PROTONIX) IV  40  mg Intravenous Q24H  . potassium chloride  10 mEq Intravenous Q1 Hr x 4  . potassium chloride  40 mEq Oral BID  . sodium chloride  3 mL Intravenous Q12H  . verapamil  240 mg Oral Q breakfast   Continuous: . sodium chloride 75 mL/hr at 02/01/15 1047   JSU:NHRVACQPEAK, ondansetron **OR** ondansetron (ZOFRAN) IV  Assesment: She was admitted with nausea and vomiting probably related to gastroparesis. She is hypokalemic and this will be replaced both by mouth and IV. I'm not sure she'll be able to tolerate by mouth. She has diabetes which is doing a little bit better.  At baseline she has had a stroke several years ago and still  has residual defects.  At baseline she is severely depressed but adamantly refuses to take anything Active Problems:   Diabetes [facility])   Essential hypertension   Gastroparesis    Plan: Continue current treatments. Decrease her IV fluids. Replace her potassium. Social service consultation on 02/03/2015    LOS: 2 days   Kacy Conely L 02/01/2015, 11:12 AM

## 2015-02-01 NOTE — Progress Notes (Signed)
Enema given per MD order.  No results from patient as this time.

## 2015-02-02 DIAGNOSIS — R112 Nausea with vomiting, unspecified: Secondary | ICD-10-CM

## 2015-02-02 LAB — GLUCOSE, CAPILLARY
GLUCOSE-CAPILLARY: 119 mg/dL — AB (ref 65–99)
Glucose-Capillary: 124 mg/dL — ABNORMAL HIGH (ref 65–99)
Glucose-Capillary: 150 mg/dL — ABNORMAL HIGH (ref 65–99)
Glucose-Capillary: 129 mg/dL — ABNORMAL HIGH (ref 65–99)

## 2015-02-02 LAB — BASIC METABOLIC PANEL
Anion gap: 8 (ref 5–15)
BUN: 11 mg/dL (ref 6–20)
CHLORIDE: 103 mmol/L (ref 101–111)
CO2: 26 mmol/L (ref 22–32)
CREATININE: 0.69 mg/dL (ref 0.44–1.00)
Calcium: 8.5 mg/dL — ABNORMAL LOW (ref 8.9–10.3)
GLUCOSE: 135 mg/dL — AB (ref 65–99)
POTASSIUM: 3 mmol/L — AB (ref 3.5–5.1)
SODIUM: 137 mmol/L (ref 135–145)

## 2015-02-02 MED ORDER — SODIUM CHLORIDE 0.9 % IV SOLN
8.0000 mg | Freq: Four times a day (QID) | INTRAVENOUS | Status: DC
  Administered 2015-02-02 – 2015-02-06 (×15): 8 mg via INTRAVENOUS
  Filled 2015-02-02 (×17): qty 4

## 2015-02-02 MED ORDER — POTASSIUM CHLORIDE 10 MEQ/100ML IV SOLN
10.0000 meq | INTRAVENOUS | Status: AC
  Administered 2015-02-02 (×4): 10 meq via INTRAVENOUS
  Filled 2015-02-02 (×3): qty 100

## 2015-02-02 NOTE — Progress Notes (Signed)
Patient states nausea improved but not resolved. She reports a good BM this morning.  Tolerating clear liquid diet. Not interested in solid food. Zofran has been change to a scheduled agent.  No apparent side effects with Reglan.    Vital signs in last 24 hours: Temp:  [98.4 F (36.9 C)-99.4 F (37.4 C)] 98.4 F (36.9 C) (01/08 0515) Pulse Rate:  [76-78] 76 (01/08 0515) Resp:  [18-20] 20 (01/08 0515) BP: (161-195)/(78-95) 195/95 mmHg (01/08 0515) SpO2:  [92 %-95 %] 95 % (01/08 0515) Last BM Date: 01/29/15 General:   Alert,   pleasant and cooperative in NAD Abdomen:  Nondistended.  Normal bowel sounds, without guarding, and without rebound.  No mass or organomegaly.No succussion splash Extremities:  Without clubbing or edema.    Intake/Output from previous day: 01/07 0701 - 01/08 0700 In: 1547.9 [I.V.:1147.9; IV Piggyback:400] Out: 3100 [Urine:3100] Intake/Output this shift:    Lab Results:  Recent Labs  01/31/15 0442  WBC 12.1*  HGB 11.7*  HCT 36.2  PLT 221   BMET  Recent Labs  01/31/15 0442 02/01/15 0604 02/02/15 0551  NA 143 140 137  K 3.2* 2.5* 3.0*  CL 108 107 103  CO2 26 26 26   GLUCOSE 247* 139* 135*  BUN 14 14 11   CREATININE 0.66 0.71 0.69  CALCIUM 8.8* 8.5* 8.5*   LFT  Recent Labs  01/31/15 0442  PROT 6.8  ALBUMIN 3.4*  AST 15  ALT 11*  ALKPHOS 96  BILITOT 0.5   PT/INR No results for input(s): LABPROT, INR in the last 72 hours. Hepatitis Panel No results for input(s): HEPBSAG, HCVAB, HEPAIGM, HEPBIGM in the last 72 hours. C-Diff No results for input(s): CDIFFTOX in the last 72 hours.  Studies/Results: No results found.  Assessment: Active Problems:   Diabetes ([place])   Essential hypertension   Gastroparesis   Impression:   Diabetic gastroparesis slowly improving. Persistent hypokalemia.  Recommendations:   Continue present regimen. I would not advance diet today. Agree with Zofran as a scheduled agent. Dr. Laural Golden will reassess  tomorrow morning.

## 2015-02-02 NOTE — Progress Notes (Signed)
Subjective: She is still nauseated. She has not been able to eat much but has been able to keep Potassium down. Also today. I discussed with her daughters today a.m. with the patient about the possibility of assisted living facility.. The patient is quite against that. She is apparently not been bathing has fallen on multiple occasions in her family is concerned that the situation is not safe  Objective: Vital signs in last 24 hours: Temp:  [98.4 F (36.9 C)-99.4 F (37.4 C)] 98.4 F (36.9 C) (01/08 0515) Pulse Rate:  [76-78] 76 (01/08 0515) Resp:  [18-20] 20 (01/08 0515) BP: (161-195)/(78-95) 195/95 mmHg (01/08 0515) SpO2:  [92 %-95 %] 95 % (01/08 0515) Weight change:  Last BM Date: 01/29/15  Intake/Output from previous day: 01/07 0701 - 01/08 0700 In: 1547.9 [I.V.:1147.9; IV Piggyback:400] Out: 3100 [Urine:3100]  PHYSICAL EXAM General appearance: alert and moderate distress Resp: clear to auscultation bilaterally Cardio: regular rate and rhythm, S1, S2 normal, no murmur, click, rub or gallop GI: soft, non-tender; bowel sounds normal; no masses,  no organomegaly Extremities: extremities normal, atraumatic, no cyanosis or edema  Lab Results:  Results for orders placed or performed during the hospital encounter of 01/30/15 (from the past 48 hour(s))  Glucose, capillary     Status: Abnormal   Collection Time: 01/31/15 11:37 AM  Result Value Ref Range   Glucose-Capillary 178 (H) 65 - 99 mg/dL  Glucose, capillary     Status: Abnormal   Collection Time: 01/31/15  4:47 PM  Result Value Ref Range   Glucose-Capillary 184 (H) 65 - 99 mg/dL  Glucose, capillary     Status: Abnormal   Collection Time: 01/31/15  8:25 PM  Result Value Ref Range   Glucose-Capillary 198 (H) 65 - 99 mg/dL   Comment 1 Notify RN    Comment 2 Document in Chart   Basic metabolic panel     Status: Abnormal   Collection Time: 02/01/15  6:04 AM  Result Value Ref Range   Sodium 140 135 - 145 mmol/L   Potassium 2.5 (LL) 3.5 - 5.1 mmol/L    Comment: DELTA CHECK NOTED CRITICAL RESULT CALLED TO, READ BACK BY AND VERIFIED WITH: BROWER B. AT 0740A ON [id number] BY THOMPSON S.    Chloride 107 101 - 111 mmol/L   CO2 26 22 - 32 mmol/L   Glucose, Bld 139 (H) 65 - 99 mg/dL   BUN 14 6 - 20 mg/dL   Creatinine, Ser 0.71 0.44 - 1.00 mg/dL   Calcium 8.5 (L) 8.9 - 10.3 mg/dL   GFR calc non Af Amer >60 >60 mL/min   GFR calc Af Amer >60 >60 mL/min    Comment: (NOTE) The eGFR has been calculated using the CKD EPI equation. This calculation has not been validated in all clinical situations. eGFR's persistently <60 mL/min signify possible Chronic Kidney Disease.    Anion gap 7 5 - 15  Glucose, capillary     Status: Abnormal   Collection Time: 02/01/15  7:44 AM  Result Value Ref Range   Glucose-Capillary 129 (H) 65 - 99 mg/dL  Glucose, capillary     Status: Abnormal   Collection Time: 02/01/15 11:43 AM  Result Value Ref Range   Glucose-Capillary 144 (H) 65 - 99 mg/dL  Glucose, capillary     Status: Abnormal   Collection Time: 02/01/15  5:06 PM  Result Value Ref Range   Glucose-Capillary 179 (H) 65 - 99 mg/dL  Glucose, capillary     Status:  Abnormal   Collection Time: 02/01/15  8:45 PM  Result Value Ref Range   Glucose-Capillary 136 (H) 65 - 99 mg/dL   Comment 1 Notify RN    Comment 2 Document in Chart   Basic metabolic panel     Status: Abnormal   Collection Time: 02/02/15  5:51 AM  Result Value Ref Range   Sodium 137 135 - 145 mmol/L   Potassium 3.0 (L) 3.5 - 5.1 mmol/L   Chloride 103 101 - 111 mmol/L   CO2 26 22 - 32 mmol/L   Glucose, Bld 135 (H) 65 - 99 mg/dL   BUN 11 6 - 20 mg/dL   Creatinine, Ser 0.69 0.44 - 1.00 mg/dL   Calcium 8.5 (L) 8.9 - 10.3 mg/dL   GFR calc non Af Amer >60 >60 mL/min   GFR calc Af Amer >60 >60 mL/min    Comment: (NOTE) The eGFR has been calculated using the CKD EPI equation. This calculation has not been validated in all clinical situations. eGFR's  persistently <60 mL/min signify possible Chronic Kidney Disease.    Anion gap 8 5 - 15  Glucose, capillary     Status: Abnormal   Collection Time: 02/02/15  7:43 AM  Result Value Ref Range   Glucose-Capillary 124 (H) 65 - 99 mg/dL    ABGS No results for input(s): PHART, PO2ART, TCO2, HCO3 in the last 72 hours.  Invalid input(s): PCO2 CULTURES Recent Results (from the past 240 hour(s))  MRSA PCR Screening     Status: None   Collection Time: 01/30/15  6:40 PM  Result Value Ref Range Status   MRSA by PCR NEGATIVE NEGATIVE Final    Comment:        The GeneXpert MRSA Assay (FDA approved for NASAL specimens only), is one component of a comprehensive MRSA colonization surveillance program. It is not intended to diagnose MRSA infection nor to guide or monitor treatment for MRSA infections.    Studies/Results: No results found.  Medications:  Prior to Admission:  Prescriptions prior to admission  Medication Sig Dispense Refill Last Dose  . aspirin EC 81 MG tablet Take 81 mg by mouth daily.   01/29/2015 at Unknown time  . gabapentin (NEURONTIN) 300 MG capsule Take 1,200 mg by mouth at bedtime.    01/28/2015  . glipiZIDE (GLUCOTROL) 10 MG tablet Take 10 mg by mouth 2 (two) times daily before a meal.    01/29/2015 at Unknown time  . Insulin Detemir (LEVEMIR FLEXPEN) 100 UNIT/ML Pen Inject 20 Units into the skin at bedtime.   01/29/2015 at Unknown time  . Multiple Vitamins-Minerals (CENTRUM SILVER PO) Take 1 tablet by mouth daily.   01/28/2015  . naproxen sodium (ALEVE) 220 MG tablet Take 220 mg by mouth 2 (two) times daily as needed (pain).   01/27/2015  . pantoprazole (PROTONIX) 40 MG tablet Take 40 mg by mouth daily.    01/29/2015 at Unknown time  . pravastatin (PRAVACHOL) 40 MG tablet Take 40 mg by mouth at bedtime.    01/28/2015  . verapamil (CALAN-SR) 240 MG CR tablet Take 240 mg by mouth daily with breakfast.    01/29/2015 at Unknown time  . docusate sodium (COLACE) 100 MG capsule Take 1  capsule (100 mg total) by mouth daily. (Patient not taking: Reported on 11/30/2014) 1 capsule 0   . insulin detemir (LEVEMIR) 100 UNIT/ML injection Inject 0.12 mLs (12 Units total) into the skin daily. (Patient not taking: Reported on 11/30/2014) 10 mL 11 Taking  .  lidocaine (LIDODERM) 5 % Place 1 patch onto the skin daily. Remove & Discard patch within 12 hours or as directed by MD (Patient not taking: Reported on 11/30/2014) 30 patch 1   . oxyCODONE-acetaminophen (PERCOCET/ROXICET) 5-325 MG tablet Take 1 tablet by mouth every 6 (six) hours as needed. (Patient not taking: Reported on 01/30/2015) 30 tablet 0 Not Taking at Unknown time   Scheduled: . enoxaparin (LOVENOX) injection  40 mg Subcutaneous Q24H  . insulin aspart  0-20 Units Subcutaneous TID WC  . insulin aspart  0-5 Units Subcutaneous QHS  . insulin detemir  25 Units Subcutaneous QHS  . metoCLOPramide (REGLAN) injection  10 mg Intravenous TID AC & HS  . ondansetron (ZOFRAN) IV  8 mg Intravenous 4 times per day  . pantoprazole (PROTONIX) IV  40 mg Intravenous Q12H  . potassium chloride  10 mEq Intravenous Q1 Hr x 4  . potassium chloride  40 mEq Oral BID  . sodium chloride  3 mL Intravenous Q12H  . verapamil  240 mg Oral Q breakfast   Continuous: . sodium chloride 75 mL/hr at 02/01/15 1352   DJS:HFWYOVZCHYIFO (TYLENOL) oral liquid 160 mg/5 mL, hydrALAZINE, ondansetron **OR** ondansetron (ZOFRAN) IV  Assesment: She was admitted with gastroparesis and intractable nausea and vomiting. She has multiple other medical problems. She has diabetes and that's better. She is still gagging. She is still hypokalemic Active Problems:   Diabetes ([place])   Essential hypertension   Gastroparesis    Plan: I'm going to have her get more potassium. Have PT consultation. Social work [place]. I put her on Zofran around the clock    LOS: 3 days   Dietrick Barris L 02/02/2015, 10:53 AM

## 2015-02-03 DIAGNOSIS — E876 Hypokalemia: Secondary | ICD-10-CM | POA: Diagnosis present

## 2015-02-03 DIAGNOSIS — R112 Nausea with vomiting, unspecified: Secondary | ICD-10-CM | POA: Diagnosis present

## 2015-02-03 DIAGNOSIS — F32A Depression, unspecified: Secondary | ICD-10-CM | POA: Diagnosis present

## 2015-02-03 DIAGNOSIS — F329 Major depressive disorder, single episode, unspecified: Secondary | ICD-10-CM | POA: Diagnosis present

## 2015-02-03 LAB — BASIC METABOLIC PANEL
ANION GAP: 10 (ref 5–15)
BUN: 11 mg/dL (ref 6–20)
CHLORIDE: 102 mmol/L (ref 101–111)
CO2: 25 mmol/L (ref 22–32)
Calcium: 8.6 mg/dL — ABNORMAL LOW (ref 8.9–10.3)
Creatinine, Ser: 0.78 mg/dL (ref 0.44–1.00)
GFR calc Af Amer: >60 mL/min (ref >60)
GLUCOSE: 93 mg/dL (ref 65–99)
POTASSIUM: 2.9 mmol/L — AB (ref 3.5–5.1)
SODIUM: 137 mmol/L (ref 135–145)

## 2015-02-03 LAB — GLUCOSE, CAPILLARY
GLUCOSE-CAPILLARY: 146 mg/dL — AB (ref 65–99)
GLUCOSE-CAPILLARY: 127 mg/dL — AB (ref 65–99)
Glucose-Capillary: 95 mg/dL (ref 65–99)
Glucose-Capillary: 147 mg/dL — ABNORMAL HIGH (ref 65–99)

## 2015-02-03 LAB — HEMOGLOBIN A1C
Hgb A1c MFr Bld: 9.4 % — ABNORMAL HIGH (ref 4.8–5.6)
MEAN PLASMA GLUCOSE: 223 mg/dL

## 2015-02-03 LAB — MAGNESIUM: Magnesium: 1.6 mg/dL — ABNORMAL LOW (ref 1.7–2.4)

## 2015-02-03 MED ORDER — POTASSIUM CHLORIDE CRYS ER 20 MEQ PO TBCR
40.0000 meq | EXTENDED_RELEASE_TABLET | Freq: Three times a day (TID) | ORAL | Status: DC
  Administered 2015-02-03 – 2015-02-04 (×6): 40 meq via ORAL
  Filled 2015-02-03 (×15): qty 2

## 2015-02-03 MED ORDER — ACETAMINOPHEN 325 MG PO TABS
650.0000 mg | ORAL_TABLET | Freq: Four times a day (QID) | ORAL | Status: DC | PRN
  Administered 2015-02-03 (×2): 650 mg via ORAL
  Filled 2015-02-03 (×3): qty 2

## 2015-02-03 MED ORDER — CITALOPRAM HYDROBROMIDE 20 MG PO TABS
20.0000 mg | ORAL_TABLET | Freq: Every day | ORAL | Status: DC
  Administered 2015-02-03 – 2015-02-06 (×4): 20 mg via ORAL
  Filled 2015-02-03 (×6): qty 1

## 2015-02-03 MED ORDER — SERTRALINE HCL 50 MG PO TABS
50.0000 mg | ORAL_TABLET | Freq: Every day | ORAL | Status: DC
  Filled 2015-02-03: qty 1

## 2015-02-03 NOTE — Progress Notes (Signed)
PT Cancellation Note  Patient Details Name: Sayana C [place]ique MRN: [id number] DOB: [birth year]   Cancelled Treatment:    Reason Eval/Treat Not Completed: Patient declined, no reason specified. Chart reviewed, RN consulted. Pt refusing PT at this time due to not feeling well. Pt reports she has a very bad HA and is unable to attempt OOB assessment. Will reattempt at later date/time.    Buccola,Allan C 02/03/2015, 10:52 AM  10:53 AM  Etta Grandchild, PT, DPT [place] License # [id number]

## 2015-02-03 NOTE — Progress Notes (Signed)
  Subjective:  Patient continues to complain of nausea and remains with poor appetite. She Jell-O and orange juice down. She also took her pills. According to nursing staff she starts heaving as soon as she puts food in her mouth. She denies abdominal pain melena or rectal bleeding. Her daughter Altha Harm is very concerned about her illness and does not feel that she can manage herself at home by herself.  Objective: Blood pressure 176/92, pulse 102, temperature 98.5 F (36.9 C), temperature source Oral, resp. rate 18, height 5' 3.5" (1.613 m), weight 191 lb 9.3 oz (86.9 kg), SpO2 95 %. Patient is alert and in no acute distress. Abdomen is symmetrical. Bowel sounds are normal. On palpation abdomen is soft and nontender without organomegaly or masses. No LE edema or clubbing noted. No tremors noted.  Labs/studies Results:  BMET   Recent Labs  02/01/15 0604 02/02/15 0551 02/03/15 0609  NA 140 137 137  K 2.5* 3.0* 2.9*  CL 107 103 102  CO2 26 26 25   GLUCOSE 139* 135* 93  BUN 14 11 11   CREATININE 0.71 0.69 0.78  CALCIUM 8.5* 8.5* 8.6*     Assessment:  #1. Nausea and vomiting secondary to diabetic gastroparesis in the setting of poorly controlled diabetes mellitus. Response to IV metoclopramide and PPI as well as antiasthmatic very slow. Oral intake remains poor. Abdominal exam is benign. She is tolerating metoclopramide. Will consider diagnostic EGD unless oral intake improves. #2. Hypokalemia. She is on oral potassium and keeping it down. #3. Depression partly to account for nausea and anorexia. #4.  Diabetes mellitus. #5.  Hypertension.  Recommendations:  Will check serum magnesium. Will reevaluate patient in a.m. and consider diagnostic EGD if symptoms persist.

## 2015-02-03 NOTE — Progress Notes (Signed)
Subjective: Her nausea is better. She refused to take potassium last night and her potassium level is still low. If she won't take a pill this morning I will need to give her IV potassium  Objective: Vital signs in last 24 hours: Temp:  [98.3 F (36.8 C)-99.1 F (37.3 C)] 98.7 F (37.1 C) (01/09 0449) Pulse Rate:  [69-80] 80 (01/09 0449) Resp:  [18-20] 18 (01/09 0449) BP: (160-185)/(78-94) 176/79 mmHg (01/09 0633) SpO2:  [95 %] 95 % (01/09 0449) Weight change:  Last BM Date: 02/02/15  Intake/Output from previous day: 01/08 0701 - 01/09 0700 In: 222 [P.O.:222] Out: 1375 [Urine:1375]  PHYSICAL EXAM General appearance: alert and mild distress Resp: clear to auscultation bilaterally Cardio: regular rate and rhythm, S1, S2 normal, no murmur, click, rub or gallop GI: soft, non-tender; bowel sounds normal; no masses,  no organomegaly Extremities: extremities normal, atraumatic, no cyanosis or edema  Lab Results:  Results for orders placed or performed during the hospital encounter of 01/30/15 (from the past 48 hour(s))  Glucose, capillary     Status: Abnormal   Collection Time: 02/01/15 11:43 AM  Result Value Ref Range   Glucose-Capillary 144 (H) 65 - 99 mg/dL  Glucose, capillary     Status: Abnormal   Collection Time: 02/01/15  5:06 PM  Result Value Ref Range   Glucose-Capillary 179 (H) 65 - 99 mg/dL  Glucose, capillary     Status: Abnormal   Collection Time: 02/01/15  8:45 PM  Result Value Ref Range   Glucose-Capillary 136 (H) 65 - 99 mg/dL   Comment 1 Notify RN    Comment 2 Document in Chart   Basic metabolic panel     Status: Abnormal   Collection Time: 02/02/15  5:51 AM  Result Value Ref Range   Sodium 137 135 - 145 mmol/L   Potassium 3.0 (L) 3.5 - 5.1 mmol/L   Chloride 103 101 - 111 mmol/L   CO2 26 22 - 32 mmol/L   Glucose, Bld 135 (H) 65 - 99 mg/dL   BUN 11 6 - 20 mg/dL   Creatinine, Ser 0.69 0.44 - 1.00 mg/dL   Calcium 8.5 (L) 8.9 - 10.3 mg/dL   GFR calc non  Af Amer >60 >60 mL/min   GFR calc Af Amer >60 >60 mL/min    Comment: (NOTE) The eGFR has been calculated using the CKD EPI equation. This calculation has not been validated in all clinical situations. eGFR's persistently <60 mL/min signify possible Chronic Kidney Disease.    Anion gap 8 5 - 15  Glucose, capillary     Status: Abnormal   Collection Time: 02/02/15  7:43 AM  Result Value Ref Range   Glucose-Capillary 124 (H) 65 - 99 mg/dL  Glucose, capillary     Status: Abnormal   Collection Time: 02/02/15 11:37 AM  Result Value Ref Range   Glucose-Capillary 150 (H) 65 - 99 mg/dL  Glucose, capillary     Status: Abnormal   Collection Time: 02/02/15  4:35 PM  Result Value Ref Range   Glucose-Capillary 129 (H) 65 - 99 mg/dL  Glucose, capillary     Status: Abnormal   Collection Time: 02/02/15  8:01 PM  Result Value Ref Range   Glucose-Capillary 119 (H) 65 - 99 mg/dL  Basic metabolic panel     Status: Abnormal   Collection Time: 02/03/15  6:09 AM  Result Value Ref Range   Sodium 137 135 - 145 mmol/L   Potassium 2.9 (L) 3.5 - 5.1  mmol/L   Chloride 102 101 - 111 mmol/L   CO2 25 22 - 32 mmol/L   Glucose, Bld 93 65 - 99 mg/dL   BUN 11 6 - 20 mg/dL   Creatinine, Ser 0.78 0.44 - 1.00 mg/dL   Calcium 8.6 (L) 8.9 - 10.3 mg/dL   GFR calc non Af Amer >60 >60 mL/min   GFR calc Af Amer >60 >60 mL/min    Comment: (NOTE) The eGFR has been calculated using the CKD EPI equation. This calculation has not been validated in all clinical situations. eGFR's persistently <60 mL/min signify possible Chronic Kidney Disease.    Anion gap 10 5 - 15  Glucose, capillary     Status: None   Collection Time: 02/03/15  7:38 AM  Result Value Ref Range   Glucose-Capillary 95 65 - 99 mg/dL    ABGS No results for input(s): PHART, PO2ART, TCO2, HCO3 in the last 72 hours.  Invalid input(s): PCO2 CULTURES Recent Results (from the past 240 hour(s))  MRSA PCR Screening     Status: None   Collection Time:  01/30/15  6:40 PM  Result Value Ref Range Status   MRSA by PCR NEGATIVE NEGATIVE Final    Comment:        The GeneXpert MRSA Assay (FDA approved for NASAL specimens only), is one component of a comprehensive MRSA colonization surveillance program. It is not intended to diagnose MRSA infection nor to guide or monitor treatment for MRSA infections.    Studies/Results: No results found.  Medications:  Prior to Admission:  Prescriptions prior to admission  Medication Sig Dispense Refill Last Dose  . aspirin EC 81 MG tablet Take 81 mg by mouth daily.   01/29/2015 at Unknown time  . gabapentin (NEURONTIN) 300 MG capsule Take 1,200 mg by mouth at bedtime.    01/28/2015  . glipiZIDE (GLUCOTROL) 10 MG tablet Take 10 mg by mouth 2 (two) times daily before a meal.    01/29/2015 at Unknown time  . Insulin Detemir (LEVEMIR FLEXPEN) 100 UNIT/ML Pen Inject 20 Units into the skin at bedtime.   01/29/2015 at Unknown time  . Multiple Vitamins-Minerals (CENTRUM SILVER PO) Take 1 tablet by mouth daily.   01/28/2015  . naproxen sodium (ALEVE) 220 MG tablet Take 220 mg by mouth 2 (two) times daily as needed (pain).   01/27/2015  . pantoprazole (PROTONIX) 40 MG tablet Take 40 mg by mouth daily.    01/29/2015 at Unknown time  . pravastatin (PRAVACHOL) 40 MG tablet Take 40 mg by mouth at bedtime.    01/28/2015  . verapamil (CALAN-SR) 240 MG CR tablet Take 240 mg by mouth daily with breakfast.    01/29/2015 at Unknown time  . docusate sodium (COLACE) 100 MG capsule Take 1 capsule (100 mg total) by mouth daily. (Patient not taking: Reported on 11/30/2014) 1 capsule 0   . insulin detemir (LEVEMIR) 100 UNIT/ML injection Inject 0.12 mLs (12 Units total) into the skin daily. (Patient not taking: Reported on 11/30/2014) 10 mL 11 Taking  . lidocaine (LIDODERM) 5 % Place 1 patch onto the skin daily. Remove & Discard patch within 12 hours or as directed by MD (Patient not taking: Reported on 11/30/2014) 30 patch 1   .  oxyCODONE-acetaminophen (PERCOCET/ROXICET) 5-325 MG tablet Take 1 tablet by mouth every 6 (six) hours as needed. (Patient not taking: Reported on 01/30/2015) 30 tablet 0 Not Taking at Unknown time   Scheduled: . enoxaparin (LOVENOX) injection  40 mg Subcutaneous Q24H  .  insulin aspart  0-20 Units Subcutaneous TID WC  . insulin aspart  0-5 Units Subcutaneous QHS  . insulin detemir  25 Units Subcutaneous QHS  . metoCLOPramide (REGLAN) injection  10 mg Intravenous TID AC & HS  . ondansetron (ZOFRAN) IV  8 mg Intravenous 4 times per day  . pantoprazole (PROTONIX) IV  40 mg Intravenous Q12H  . potassium chloride  40 mEq Oral TID  . sertraline  50 mg Oral Daily  . sodium chloride  3 mL Intravenous Q12H  . verapamil  240 mg Oral Q breakfast   Continuous: . sodium chloride 75 mL/hr at 02/03/15 0004   WLN:[id number], hydrALAZINE, ondansetron **OR** ondansetron (ZOFRAN) IV  Assesment: She was admitted with intractable nausea and vomiting which is better but not resolved. She has diabetes which has not been very well controlled. Apparently she's not been following her diet at home. She has depression although she says she does not and has been intolerant of a number of antidepressants. She has gastroparesis. Active Problems:   Diabetes [facility])   Essential hypertension   Gastroparesis    Plan: Add antidepressant. Continue with potassium replacement. Social services and physical therapy consultation.    LOS: 4 days   Shenaya Lebo L 02/03/2015, 8:57 AM

## 2015-02-03 NOTE — Care Management Note (Signed)
Case Management Note  Patient Details  Name: Lori Mahoney MRN: [id number] Date of Birth: [birth year]  Subjective/Objective:                  Pt admitted from home with nausea and vomiting. Pt lives alone and will return home at discharge. Pt has had frequent falls at home. Pt has a quad cane, RW, and life alert button.  Action/Plan: Awaiting PT eval. Will continue to follow for discharge planning needs. Expected Discharge Date:                  Expected Discharge Plan:  Skilled Nursing Facility  In-House Referral:  Clinical Social Work  Discharge planning Services  CM Consult  Post Acute Care Choice:  NA Choice offered to:  NA  DME Arranged:    DME Agency:     HH Arranged:    [place] Agency:     Status of Service:  In process, will continue to follow  Medicare Important Message Given:  Yes Date Medicare IM Given:    Medicare IM give by:    Date Additional Medicare IM Given:    Additional Medicare Important Message give by:     If discussed at [place] of Stay Meetings, dates discussed:    Additional Comments:  Joylene Draft, RN 02/03/2015, 12:22 PM

## 2015-02-03 NOTE — Clinical Social Work Note (Signed)
Clinical Social Work Assessment  Patient Details  Name: Lori Mahoney MRN: [id number] Date of Birth: [birth year]  Date of referral:  02/03/15               Reason for consult:  Discharge Planning, Mental Health Concerns                Permission sought to share information with:  Family Supports Permission granted to share information::  Yes, Verbal Permission Granted  Name::     Diplomatic Services operational officer::     Relationship::  daughter  Contact Information:     Housing/Transportation Living arrangements for the past 2 months:  [place] of Information:  Patient, Adult Children Patient Interpreter Needed:  None Criminal Activity/Legal Involvement Pertinent to Current Situation/Hospitalization:  No - Comment as needed Significant Relationships:  Adult Children Lives with:  Self Do you feel safe going back to the place where you live?  Yes Need for family participation in patient care:  Yes (Comment)  Care giving concerns:  Pt lives alone.    Social Worker assessment / plan:  CSW met with pt and pt's daughter, Lori Mahoney at bedside. Pt alert and oriented and reports she lives alone and feels she manages okay on her own. Her 2 daughters live nearby and are involved and supportive. Pt admits to several falls at home and said her children are concerned about this. She has a Lif[employer] at home, but refuses to wear it. After discussion, pt was more agreeable to this and understands daughter's concerns. At baseline, pt ambulates using a cane or walker and still drives. She typically goes out to pick up food. CSW asked pt about depression. She admits for the past 2 years she has had feelings of depression "off and on" but her daughter said it has been longer. Pt has been on medication in the past, but stopped because it made her sick. She does not feel that therapy/psych follow up would benefit her in any way. Pt indicates no sleep or appetite changes recently. CSW asked pt about d/c plan and she  agrees to await PT recommendations. PT tried to assess pt this morning, but pt decline due to headache.   Employment status:  Retired Nurse, adult PT Recommendations:  Not assessed at this time Information / Referral to community resources:  Other (Comment Required) (to be determined)  Patient/Family's Response to care:  Pt agrees to see what PT recommends to discuss d/c planning further. She refuses outpatient follow up for depression.   Patient/Family's Understanding of and Emotional Response to Diagnosis, Current Treatment, and Prognosis:  Pt and family aware of admission diagnosis and treatment plan.   Emotional Assessment Appearance:  Appears stated age Attitude/Demeanor/Rapport:  Other (Cooperative) Affect (typically observed):  Flat Orientation:  Oriented to Self, Oriented to Place, Oriented to  Time, Oriented to Situation Alcohol / Substance use:  Not Applicable Psych involvement (Current and /or in the community):  No (Comment)  Discharge Needs  Concerns to be addressed:  Discharge Planning Concerns Readmission within the last 30 days:  No Current discharge risk:  Lives alone Barriers to Discharge:  Continued Medical Work up   [employer], [employer], [place] 02/03/2015, 12:26 PM [phone]

## 2015-02-04 LAB — BASIC METABOLIC PANEL
ANION GAP: 10 (ref 5–15)
BUN: 9 mg/dL (ref 6–20)
CHLORIDE: 103 mmol/L (ref 101–111)
CO2: 22 mmol/L (ref 22–32)
Calcium: 8.7 mg/dL — ABNORMAL LOW (ref 8.9–10.3)
Creatinine, Ser: 0.74 mg/dL (ref 0.44–1.00)
GFR calc Af Amer: >60 mL/min (ref >60)
GFR calc non Af Amer: >60 mL/min (ref >60)
Glucose, Bld: 191 mg/dL — ABNORMAL HIGH (ref 65–99)
POTASSIUM: 3.5 mmol/L (ref 3.5–5.1)
SODIUM: 135 mmol/L (ref 135–145)

## 2015-02-04 LAB — GLUCOSE, CAPILLARY
GLUCOSE-CAPILLARY: 138 mg/dL — AB (ref 65–99)
GLUCOSE-CAPILLARY: 134 mg/dL — AB (ref 65–99)
Glucose-Capillary: 158 mg/dL — ABNORMAL HIGH (ref 65–99)
Glucose-Capillary: 192 mg/dL — ABNORMAL HIGH (ref 65–99)

## 2015-02-04 MED ORDER — METOPROLOL TARTRATE 50 MG PO TABS
50.0000 mg | ORAL_TABLET | Freq: Two times a day (BID) | ORAL | Status: DC
  Administered 2015-02-04 – 2015-02-06 (×5): 50 mg via ORAL
  Filled 2015-02-04 (×9): qty 1

## 2015-02-04 MED ORDER — MAGNESIUM SULFATE IN D5W 10-5 MG/ML-% IV SOLN
1.0000 g | Freq: Once | INTRAVENOUS | Status: AC
  Administered 2015-02-04: 1 g via INTRAVENOUS
  Filled 2015-02-04: qty 100

## 2015-02-04 MED ORDER — PANTOPRAZOLE SODIUM 40 MG PO TBEC
40.0000 mg | DELAYED_RELEASE_TABLET | Freq: Two times a day (BID) | ORAL | Status: DC
  Administered 2015-02-04 – 2015-02-06 (×4): 40 mg via ORAL
  Filled 2015-02-04 (×8): qty 1

## 2015-02-04 MED ORDER — ENOXAPARIN SODIUM 40 MG/0.4ML ~~LOC~~ SOLN
40.0000 mg | SUBCUTANEOUS | Status: DC
  Administered 2015-02-05: 40 mg via SUBCUTANEOUS
  Filled 2015-02-04 (×2): qty 0.4

## 2015-02-04 NOTE — Evaluation (Signed)
Physical Therapy Evaluation Patient Details Name: Lori Mahoney MRN: [id number] DOB: Apr 19, 1945 Today's Date: 02/04/2015   History of Present Illness  70yo white female with PMH: CVA, L TKA, and R patella surgery, comes to [facility] after inretractable nausea, vomitting, and episodic diarrhea. Pt was admitted with gastroparesis. Since admission, pt with hypertension and tachycardia and irregular rhythm.   Clinical Impression  Pt is received seated at EOB upon entry, awake, alert, and willing to participate. No acute distress noted. Pt is A&Ox3 and pleasant, however daughters do add clarifying points to history taking. Pt reports 6 falls in the last 6 months, typically while trying to carry a load and walk throughout the house. Pt demonstrates generalized weakness in BLE, creating impairment of bed mobility, transfers, and ambulation, which all now require some physical assistance to perform, whereas at baseline they are performed with modified independence. Pt falls risk is high as evidenced by slow gait speed, poor forward reach (less than 4 inches), and altered posturing during transfers and mobility. Pt also demonstrated several LOB in room that required PT to correct. Last vitals reveal continued HTN, with SBP in the 160's, and continued tachycardia with irregular rhythm, in 130's for bed mobility/ambulation and resolving to 100's, 110's at rest (position dependent). Patient presenting with impairment of strength, balance, oxygen perfusion, and activity tolerance, limiting ability to perform ADL and mobility tasks at  baseline level of function. Patient will benefit from skilled intervention to address the above impairments and limitations, in order to restore to prior level of function, improve patient safety upon discharge, and to decrease falls risk.       Follow Up Recommendations SNF;Supervision for mobility/OOB    Equipment Recommendations  None recommended by PT    Recommendations for Other  Services       Precautions / Restrictions Precautions Precautions: Fall Restrictions Weight Bearing Restrictions: No      Mobility  Bed Mobility Overal bed mobility: Needs Assistance Bed Mobility: Supine to Sit;Sit to Supine     Supine to sit: Min assist Sit to supine: Min assist   General bed mobility comments: Requires heavy verbal cues and tactile cues to perform; multiple attmepts required for scotting in bed.   Transfers Overall transfer level: Needs assistance Equipment used: Rolling walker (2 wheeled) Transfers: Sit to/from Stand Sit to Stand: From elevated surface;Min assist         General transfer comment: Legs are very weak.   Ambulation/Gait Ambulation/Gait assistance: Min guard Ambulation Distance (Feet): 40 Feet Assistive device: Rolling walker (2 wheeled) Gait Pattern/deviations: Step-to pattern;Decreased dorsiflexion - left;Decreased weight shift to right;Decreased weight shift to left;Decreased step length - left;Decreased step length - right;Decreased dorsiflexion - right;Wide base of support   Gait velocity interpretation: <1.8 ft/sec, indicative of risk for recurrent falls General Gait Details: slow and unsteady; pt estimates ambulation is impaired by around 50% from baseline; HR goes into 140's, hence activity was limited.   Stairs            Wheelchair Mobility    Modified Rankin (Stroke Patients Only)       Balance Overall balance assessment: History of Falls;Needs assistance Sitting-balance support: Single extremity supported;Feet supported Sitting balance-Leahy Scale: Fair     Standing balance support: During functional activity;Bilateral upper extremity supported Standing balance-Leahy Scale: Poor Standing balance comment: Unable to tolerate or participate in full balance screening due to LOB; fear of falling. LOB encountered with hands free quiet stance.  High level balance activites: Backward walking                Pertinent Vitals/Pain      Home Living Family/patient expects to be discharged to:: Skilled nursing facility Living Arrangements: Alone Available Help at Discharge: Family;Other (Comment) (available intermittently thoughout the week. ) Type of Home: House Home Access: Stairs to enter   [employer] of Steps: 5 Home Layout: One level Home Equipment: Walker - 2 wheels;Walker - 4 wheels;Cane - quad;Cane - single point;Toilet riser Additional Comments: Pt uses only SPC and furniture for [place] ambulation and SPC for limited community distances.     Prior Function Level of Independence: Needs assistance      ADL's / Homemaking Assistance Needed: limited community amb with SPC; needs assistance with housework, cooking, cleaning, due to remaining impairments in RUE from CVA.         Hand Dominance        Extremity/Trunk Assessment   Upper Extremity Assessment: Defer to OT evaluation;RUE deficits/detail RUE Deficits / Details: Reports mild fine motor coordiantion deficis in Right hand from remote CVA, but is still able to sign her name.          Lower Extremity Assessment: Generalized weakness (difficulty with transferring sti to stand. )         Communication   Communication: No difficulties;HOH  Cognition Arousal/Alertness: Awake/alert Behavior During Therapy: WFL for tasks assessed/performed Overall Cognitive Status: Within Functional Limits for tasks assessed                      General Comments      Exercises        Assessment/Plan    PT Assessment Patient needs continued PT services  PT Diagnosis Difficulty walking;Abnormality of gait;Generalized weakness   PT Problem List Decreased strength;Decreased activity tolerance;Decreased balance;Decreased mobility;Decreased coordination;Cardiopulmonary status limiting activity;Decreased safety awareness  PT Treatment Interventions DME instruction;Gait training;Stair training;Functional  mobility training;Therapeutic activities;Therapeutic exercise;Balance training;Neuromuscular re-education;Patient/family education   PT Goals (Current goals can be found in the Care Plan section) Acute Rehab PT Goals Patient Stated Goal: Improve strength and regain indep in ADL/ IADL PT Goal Formulation: With patient/family Time For Goal Achievement: 02/18/15 Potential to Achieve Goals: Good    Frequency Min 3X/week   Barriers to discharge Inaccessible home environment;Decreased caregiver support      Co-evaluation               End of Session Equipment Utilized During Treatment: Gait belt Activity Tolerance: Patient tolerated treatment well;Patient limited by fatigue Patient left: in bed;with bed alarm set;with call bell/phone within reach;with family/visitor present Nurse Communication: Mobility status;Precautions         Time: [id number] PT Time Calculation (min) (ACUTE ONLY): 23 min   Charges:   PT Evaluation $PT Eval Moderate Complexity: 1 Procedure PT Treatments $Therapeutic Activity: 8-22 mins   PT G Codes:        Aashika Carta C 02/23/15, 12:19 PM  12:26 PM  Etta Grandchild, PT, DPT [place] License # [id number]

## 2015-02-04 NOTE — Clinical Social Work Placement (Signed)
   CLINICAL SOCIAL WORK PLACEMENT  NOTE  Date:  02/04/2015  Patient Details  Name: Lori Mahoney MRN: [id number] Date of Birth: [birth year]  Clinical Social Work is seeking post-discharge placement for this patient at the [place] level of care (*CSW will initial, date and re-position this form in  chart as items are completed):  Yes   Patient/family provided with [place] Work Department's list of facilities offering this level of care within the geographic area requested by the patient (or if unable, by the patient's family).  Yes   Patient/family informed of their freedom to choose among providers that offer the needed level of care, that participate in Medicare, Medicaid or managed care program needed by the patient, have an available bed and are willing to accept the patient.  Yes   Patient/family informed of [place]'s ownership interest in [facility] and [facility], as well as of the fact that they are under no obligation to receive care at these facilities.  PASRR submitted to EDS on       PASRR number received on       Existing PASRR number confirmed on 02/04/15     FL2 transmitted to all facilities in geographic area requested by pt/family on 02/04/15     FL2 transmitted to all facilities within larger geographic area on       Patient informed that his/her managed care company has contracts with or will negotiate with certain facilities, including the following:        Yes   Patient/family informed of bed offers received.  Patient chooses bed at [facility]     Physician recommends and patient chooses bed at      Patient to be transferred to [facility] on  .  Patient to be transferred to facility by       Patient family notified on   of transfer.  Name of family member notified:        PHYSICIAN       Additional Comment:    _______________________________________________ Salome Arnt, [place] 02/04/2015, 1:24 PM [phone]

## 2015-02-04 NOTE — Progress Notes (Signed)
Dr. Luan Pulling paged regarding pt's elevated HR with ambulation and elevated BP despite scheduled Verapamil and PRN Hydralazine.  MD ordered to d/c scheduled verapamil and start patient on BID PO Metoprolol 50 ml.  Will follow orders as prescribed.  Patient and patient's daughter updated at bedside.

## 2015-02-04 NOTE — NC FL2 (Signed)
[place] LEVEL OF CARE SCREENING TOOL     IDENTIFICATION  Patient Name: Lori Mahoney Birthdate: [birth year] Sex: female Admission Date (Current Location): 01/30/2015  [facility] and [place] Number:  [employer] and Address:  [facility] [address] [place]      Provider Number: [id number]  Attending Physician Name and Address:  [name], MD  Relative Name and Phone Number:       Current Level of Care: Hospital Recommended Level of Care: [place] Prior Approval Number:    Date Approved/Denied:   PASRR Number: [id number]   Discharge Plan: SNF    Current Diagnoses: Patient Active Problem List   Diagnosis Date Noted  . Intractable nausea and vomiting 02/03/2015  . Hypokalemia 02/03/2015  . Depression 02/03/2015  . Gastroparesis 01/30/2015  . 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 Vomiting 06/21/2013  . HTN (hypertension) 06/21/2013  . Dyslipidemia 06/21/2013  . Bursitis of shoulder, right 02/01/2012  . Shoulder pain 02/01/2012  . UTI (lower urinary tract infection) 10/09/2011  . Metabolic encephalopathy [id number]  . Nausea and vomiting 12/29/2010  . Hip pain 10/20/2010  . DDD (degenerative disc disease), lumbar 10/20/2010  . Diabetes ([place]) 12/23/2008  . SPINAL STENOSIS 12/23/2008  . LOW BACK PAIN 12/23/2008  . 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 Essential hypertension 12/18/2008    Orientation RESPIRATION BLADDER Height & Weight    Self, Time, Situation, Place  Normal Continent 5' 3.5" (161.3 cm) 191 lbs.  BEHAVIORAL SYMPTOMS/MOOD NEUROLOGICAL BOWEL NUTRITION STATUS  Other (Comment) (n/a)  (n/a) Continent Diet (carb modified)  AMBULATORY STATUS COMMUNICATION OF NEEDS Skin   Extensive Assist Verbally Normal                       Personal Care Assistance Level of Assistance  Bathing, Feeding, Dressing Bathing Assistance: Maximum assistance Feeding assistance: Limited  assistance Dressing Assistance: Maximum assistance     Functional Limitations Info  Hearing, Sight, Speech Sight Info: Adequate Hearing Info: Impaired Speech Info: Adequate    SPECIAL CARE FACTORS FREQUENCY  PT (By licensed PT)     PT Frequency: 5              Contractures Contractures Info: Not present    Additional Factors Info  Code Status, Allergies Code Status Info: Full code Allergies Info: Codeine, Hydrocodone-acetaminophen, Meperidine Hcl, Contrast Media, Oxycodone, Tetracyclines & Related           Current Medications (02/04/2015):  This is the current hospital active medication list Current Facility-Administered Medications  Medication Dose Route Frequency Provider Last Rate Last Dose  . 0.9 %  sodium chloride infusion   Intravenous Continuous [name], MD 75 mL/hr at 02/04/15 1148    . acetaminophen (TYLENOL) tablet 650 mg  650 mg Oral Q6H PRN [name], MD   650 mg at 02/03/15 1721  . citalopram (CELEXA) tablet 20 mg  20 mg Oral Daily [name], MD   20 mg at 02/04/15 0831  . enoxaparin (LOVENOX) injection 40 mg  40 mg Subcutaneous Q24H Nimish C Anastasio Champion, MD   40 mg at 02/03/15 2132  . hydrALAZINE (APRESOLINE) injection 5 mg  5 mg Intravenous Q4H PRN Doree Albee, MD   5 mg at 02/04/15 0717  . insulin aspart (novoLOG) injection 0-20 Units  0-20 Units Subcutaneous TID WC [name], MD   4 Units at 02/04/15 [phone]  . insulin aspart (novoLOG) injection 0-5 Units  0-5  Units Subcutaneous QHS [name], MD   0 Units at 01/31/15 2200  . insulin detemir (LEVEMIR) injection 25 Units  25 Units Subcutaneous QHS [name], MD   25 Units at 02/03/15 2131  . metoCLOPramide (REGLAN) injection 10 mg  10 mg Intravenous TID AC & HS [name], MD   10 mg at 02/04/15 0832  . ondansetron (ZOFRAN) 8 mg in sodium chloride 0.9 % 50 mL IVPB  8 mg Intravenous 4 times per day [name], MD   8 mg at 02/04/15 0546  . ondansetron (ZOFRAN) tablet 4  mg  4 mg Oral Q6H PRN Nimish C Gosrani, MD       Or  . ondansetron (ZOFRAN) injection 4 mg  4 mg Intravenous Q6H PRN Doree Albee, MD   4 mg at 01/31/15 1202  . pantoprazole (PROTONIX) EC tablet 40 mg  40 mg Oral BID [name], MD      . potassium chloride SA (K-DUR,KLOR-CON) CR tablet 40 mEq  40 mEq Oral TID [name], MD   40 mEq at 02/04/15 0831  . sodium chloride 0.9 % injection 3 mL  3 mL Intravenous Q12H Nimish Luther Parody, MD   3 mL at 02/04/15 0832  . verapamil (CALAN-SR) CR tablet 240 mg  240 mg Oral Q breakfast Doree Albee, MD   240 mg at 02/04/15 0831     Discharge Medications: Please see discharge summary for a list of discharge medications.  Relevant Imaging Results:  Relevant Lab Results:   Additional Information SS#: [id number]  Salome Arnt, [place]

## 2015-02-04 NOTE — Plan of Care (Signed)
Problem: Acute Rehab PT Goals(only PT should resolve) Goal: Patient Will Transfer Sit To/From Stand Pt will transfer sit to/from-stand with RW at [place] without loss-of-balance to demonstrate good safety awareness for independent mobility in home.     Goal: Pt Will Ambulate Pt will ambulate with RW at [place] using a step-through pattern and equal step length for a distances greater than 271ft and gait speed >0.45m/s to demonstrate the ability to perform safe household distance ambulation at discharge.    Goal: Pt Will Go Up/Down Stairs Pt will ascend/descend 10 stairs with LRAD and 1 HR at [facility] to demonstrate safe entry/exit of home.

## 2015-02-04 NOTE — Care Management Note (Signed)
Case Management Note  Patient Details  Name: Lori Mahoney MRN: [id number] Date of Birth: 12-10-1945  Subjective/Objective:                    Action/Plan:   Expected Discharge Date:                  Expected Discharge Plan:  Skilled Nursing Facility  In-House Referral:  Clinical Social Work  Discharge planning Services  CM Consult  Post Acute Care Choice:  NA Choice offered to:  NA  DME Arranged:    DME Agency:     HH Arranged:    [place] Agency:     Status of Service:  In process, will continue to follow  Medicare Important Message Given:  Yes Date Medicare IM Given:    Medicare IM give by:    Date Additional Medicare IM Given:    Additional Medicare Important Message give by:     If discussed at [place] of Stay Meetings, dates discussed: 02/04/15   Additional Comments:  Joylene Draft, RN 02/04/2015, 2:18 PM

## 2015-02-04 NOTE — Progress Notes (Signed)
Pt had two episodes of emesis after attempting to eat solid food for the first time.  Collected approximately 100 cc's of emesis, undigested food.  Dr. Laural Golden paged and made aware of the above.  Stated he would be in to see patient shortly.  Patient and patient's daughter updated at bedside.

## 2015-02-04 NOTE — Progress Notes (Signed)
Subjective: She says she feels better. She has no new complaints. Apparently she's done a little better with trying to eat but says she wants to try some solid food. She is still on scheduled Zofran and on IV Reglan. She did not have PT evaluation yesterday because of a headache  Objective: Vital signs in last 24 hours: Temp:  [98.2 F (36.8 C)-98.6 F (37 C)] 98.2 F (36.8 C) (01/10 0430) Pulse Rate:  [80-110] 110 (01/10 0430) Resp:  [18-20] 18 (01/10 0430) BP: (166-183)/(73-92) 183/89 mmHg (01/10 0716) SpO2:  [92 %-96 %] 96 % (01/10 0430) Weight change:  Last BM Date: 02/03/15  Intake/Output from previous day: 01/09 0701 - 01/10 0700 In: 2069.8 [P.O.:180; I.V.:1673.8; IV Piggyback:216] Out: 2550 [Urine:2550]  PHYSICAL EXAM General appearance: alert, mild distress and morbidly obese Resp: clear to auscultation bilaterally Cardio: regular rate and rhythm, S1, S2 normal, no murmur, click, rub or gallop GI: soft, non-tender; bowel sounds normal; no masses,  no organomegaly Extremities: extremities normal, atraumatic, no cyanosis or edema  Lab Results:  Results for orders placed or performed during the hospital encounter of 01/30/15 (from the past 48 hour(s))  Glucose, capillary     Status: Abnormal   Collection Time: 02/02/15 11:37 AM  Result Value Ref Range   Glucose-Capillary 150 (H) 65 - 99 mg/dL  Glucose, capillary     Status: Abnormal   Collection Time: 02/02/15  4:35 PM  Result Value Ref Range   Glucose-Capillary 129 (H) 65 - 99 mg/dL  Glucose, capillary     Status: Abnormal   Collection Time: 02/02/15  8:01 PM  Result Value Ref Range   Glucose-Capillary 119 (H) 65 - 99 mg/dL  Basic metabolic panel     Status: Abnormal   Collection Time: 02/03/15  6:09 AM  Result Value Ref Range   Sodium 137 135 - 145 mmol/L   Potassium 2.9 (L) 3.5 - 5.1 mmol/L   Chloride 102 101 - 111 mmol/L   CO2 25 22 - 32 mmol/L   Glucose, Bld 93 65 - 99 mg/dL   BUN 11 6 - 20 mg/dL    Creatinine, Ser 0.78 0.44 - 1.00 mg/dL   Calcium 8.6 (L) 8.9 - 10.3 mg/dL   GFR calc non Af Amer >60 >60 mL/min   GFR calc Af Amer >60 >60 mL/min    Comment: (NOTE) The eGFR has been calculated using the CKD EPI equation. This calculation has not been validated in all clinical situations. eGFR's persistently <60 mL/min signify possible Chronic Kidney Disease.    Anion gap 10 5 - 15  Magnesium     Status: Abnormal   Collection Time: 02/03/15  6:09 AM  Result Value Ref Range   Magnesium 1.6 (L) 1.7 - 2.4 mg/dL  Glucose, capillary     Status: None   Collection Time: 02/03/15  7:38 AM  Result Value Ref Range   Glucose-Capillary 95 65 - 99 mg/dL  Glucose, capillary     Status: Abnormal   Collection Time: 02/03/15 11:55 AM  Result Value Ref Range   Glucose-Capillary 146 (H) 65 - 99 mg/dL   Comment 1 Notify RN   Glucose, capillary     Status: Abnormal   Collection Time: 02/03/15  4:51 PM  Result Value Ref Range   Glucose-Capillary 147 (H) 65 - 99 mg/dL  Glucose, capillary     Status: Abnormal   Collection Time: 02/03/15  7:59 PM  Result Value Ref Range   Glucose-Capillary 127 (H) 65 -  99 mg/dL  Glucose, capillary     Status: Abnormal   Collection Time: 02/04/15  7:50 AM  Result Value Ref Range   Glucose-Capillary 158 (H) 65 - 99 mg/dL   Comment 1 Notify RN     ABGS No results for input(s): PHART, PO2ART, TCO2, HCO3 in the last 72 hours.  Invalid input(s): PCO2 CULTURES Recent Results (from the past 240 hour(s))  MRSA PCR Screening     Status: None   Collection Time: 01/30/15  6:40 PM  Result Value Ref Range Status   MRSA by PCR NEGATIVE NEGATIVE Final    Comment:        The GeneXpert MRSA Assay (FDA approved for NASAL specimens only), is one component of a comprehensive MRSA colonization surveillance program. It is not intended to diagnose MRSA infection nor to guide or monitor treatment for MRSA infections.    Studies/Results: No results found.  Medications:   Prior to Admission:  Prescriptions prior to admission  Medication Sig Dispense Refill Last Dose  . aspirin EC 81 MG tablet Take 81 mg by mouth daily.   01/29/2015 at Unknown time  . gabapentin (NEURONTIN) 300 MG capsule Take 1,200 mg by mouth at bedtime.    01/28/2015  . glipiZIDE (GLUCOTROL) 10 MG tablet Take 10 mg by mouth 2 (two) times daily before a meal.    01/29/2015 at Unknown time  . Insulin Detemir (LEVEMIR FLEXPEN) 100 UNIT/ML Pen Inject 20 Units into the skin at bedtime.   01/29/2015 at Unknown time  . Multiple Vitamins-Minerals (CENTRUM SILVER PO) Take 1 tablet by mouth daily.   01/28/2015  . naproxen sodium (ALEVE) 220 MG tablet Take 220 mg by mouth 2 (two) times daily as needed (pain).   01/27/2015  . pantoprazole (PROTONIX) 40 MG tablet Take 40 mg by mouth daily.    01/29/2015 at Unknown time  . pravastatin (PRAVACHOL) 40 MG tablet Take 40 mg by mouth at bedtime.    01/28/2015  . verapamil (CALAN-SR) 240 MG CR tablet Take 240 mg by mouth daily with breakfast.    01/29/2015 at Unknown time  . docusate sodium (COLACE) 100 MG capsule Take 1 capsule (100 mg total) by mouth daily. (Patient not taking: Reported on 11/30/2014) 1 capsule 0   . insulin detemir (LEVEMIR) 100 UNIT/ML injection Inject 0.12 mLs (12 Units total) into the skin daily. (Patient not taking: Reported on 11/30/2014) 10 mL 11 Taking  . lidocaine (LIDODERM) 5 % Place 1 patch onto the skin daily. Remove & Discard patch within 12 hours or as directed by MD (Patient not taking: Reported on 11/30/2014) 30 patch 1   . oxyCODONE-acetaminophen (PERCOCET/ROXICET) 5-325 MG tablet Take 1 tablet by mouth every 6 (six) hours as needed. (Patient not taking: Reported on 01/30/2015) 30 tablet 0 Not Taking at Unknown time   Scheduled: . citalopram  20 mg Oral Daily  . enoxaparin (LOVENOX) injection  40 mg Subcutaneous Q24H  . insulin aspart  0-20 Units Subcutaneous TID WC  . insulin aspart  0-5 Units Subcutaneous QHS  . insulin detemir  25 Units  Subcutaneous QHS  . metoCLOPramide (REGLAN) injection  10 mg Intravenous TID AC & HS  . ondansetron (ZOFRAN) IV  8 mg Intravenous 4 times per day  . pantoprazole (PROTONIX) IV  40 mg Intravenous Q12H  . potassium chloride  40 mEq Oral TID  . sodium chloride  3 mL Intravenous Q12H  . verapamil  240 mg Oral Q breakfast   Continuous: . sodium  chloride 75 mL/hr at 02/03/15 2132   [id number], hydrALAZINE, ondansetron **OR** ondansetron (ZOFRAN) IV  Assesment: She was admitted with intractable nausea and vomiting. She is better. She has gastroparesis at baseline and has poorly controlled diabetes. She has not been following her diet at home and has not been taking good care of herself at home.  She has been hypokalemic and potassium level is pending  She has depression which she denies but she did tolerate antidepressant yesterday Active Problems:   Diabetes ([place])   Essential hypertension   Gastroparesis   Intractable nausea and vomiting   Hypokalemia   Depression    Plan: PT evaluation to see if she is a candidate for skilled care facility rehabilitation which I think would be the best option if she is a candidate    LOS: 5 days   Anahid Eskelson L 02/04/2015, 8:34 AM

## 2015-02-04 NOTE — Progress Notes (Signed)
  Subjective:  Patient continues complain of nausea. She tried to eat lunch. She got sick after taking a few bites and vomited within 5 minutes. She denies heartburn or dysphagia or abdominal pain. No melena or rectal bleeding reported. She does complain of right frontal headache which she's had off and on since admission. She has not noted any change in her vision.  Objective: Blood pressure 178/93, pulse 142, temperature 98.2 F (36.8 C), temperature source Oral, resp. rate 18, height 5' 3.5" (1.613 m), weight 191 lb 9.3 oz (86.9 kg), SpO2 96 %. Patient appears to be comfortable in recliner. Abdomen is full. Bowel sounds are normal. On palpation abdomen is soft and nontender without organomegaly or masses.  No LE edema or clubbing noted.  Labs/studies Results:   Serum magnesium 1.6.   Recent Labs  02/02/15 0551 02/03/15 0609 02/04/15 0832  NA 137 137 135  K 3.0* 2.9* 3.5  CL 103 102 103  CO2 26 25 22   GLUCOSE 135* 93 191*  BUN 11 11 9   CREATININE 0.69 0.78 0.74  CALCIUM 8.5* 8.6* 8.7*     Assessment:  #1. Nausea and vomiting felt to be secondary to diabetic gastroparesis but patient not any better with IV PPI and promotility agent. Therefore will proceed with further workup starting with diagnostic esophagogastroduodenoscopy  She also complains of right frontal headache but does not have any visual symptoms.  #2. Diabetes mellitus. Diabetes has been poorly controlled because of dietary noncompliance. A1c 3 days ago was 9.4.  #3. Electrolyte abnormalities. Serum potassium is up to 3.5. Serum magnesium is low.    Recommendations:  Will give her 1 g of magnesium sulfate IV. Nonspecific esophagogastroduodenoscopy to be performed on 02/05/2015. Will hold Enoxaparin dose tonight.

## 2015-02-04 NOTE — Clinical Social Work Placement (Signed)
   CLINICAL SOCIAL WORK PLACEMENT  NOTE  Date:  02/04/2015  Patient Details  Name: Lori Mahoney MRN: [id number] Date of Birth: May 27, 1945  Clinical Social Work is seeking post-discharge placement for this patient at the [place] level of care (*CSW will initial, date and re-position this form in  chart as items are completed):  Yes   Patient/family provided with [place] Work Department's list of facilities offering this level of care within the geographic area requested by the patient (or if unable, by the patient's family).  Yes   Patient/family informed of their freedom to choose among providers that offer the needed level of care, that participate in Medicare, Medicaid or managed care program needed by the patient, have an available bed and are willing to accept the patient.  Yes   Patient/family informed of [facility]'s ownership interest in [facility] and [facility], as well as of the fact that they are under no obligation to receive care at these facilities.  PASRR submitted to EDS on       PASRR number received on       Existing PASRR number confirmed on 02/04/15     FL2 transmitted to all facilities in geographic area requested by pt/family on 02/04/15     FL2 transmitted to all facilities within larger geographic area on       Patient informed that his/her managed care company has contracts with or will negotiate with certain facilities, including the following:            Patient/family informed of bed offers received.  Patient chooses bed at       Physician recommends and patient chooses bed at      Patient to be transferred to   on  .  Patient to be transferred to facility by       Patient family notified on   of transfer.  Name of family member notified:        PHYSICIAN       Additional Comment:    _______________________________________________ Salome Arnt, [place] 02/04/2015, 11:52  AM [phone]

## 2015-02-05 ENCOUNTER — Encounter (HOSPITAL_COMMUNITY)
Admission: EM | Disposition: A | Discharge status: Skilled Nursing Facility | Payer: Self-pay | Source: Home / Self Care | Attending: Pulmonary Disease

## 2015-02-05 ENCOUNTER — Inpatient Hospital Stay (HOSPITAL_COMMUNITY): Payer: Medicare Other

## 2015-02-05 DIAGNOSIS — K3189 Other diseases of stomach and duodenum: Secondary | ICD-10-CM

## 2015-02-05 DIAGNOSIS — R112 Nausea with vomiting, unspecified: Secondary | ICD-10-CM

## 2015-02-05 DIAGNOSIS — K208 Other esophagitis: Secondary | ICD-10-CM

## 2015-02-05 HISTORY — PX: ESOPHAGOGASTRODUODENOSCOPY: SHX5428

## 2015-02-05 LAB — GLUCOSE, CAPILLARY
GLUCOSE-CAPILLARY: 100 mg/dL — AB (ref 65–99)
GLUCOSE-CAPILLARY: 119 mg/dL — AB (ref 65–99)
Glucose-Capillary: 151 mg/dL — ABNORMAL HIGH (ref 65–99)

## 2015-02-05 LAB — BASIC METABOLIC PANEL
Anion gap: 8 (ref 5–15)
BUN: 10 mg/dL (ref 6–20)
CHLORIDE: 105 mmol/L (ref 101–111)
CO2: 25 mmol/L (ref 22–32)
Calcium: 8.9 mg/dL (ref 8.9–10.3)
Creatinine, Ser: 0.81 mg/dL (ref 0.44–1.00)
GFR calc Af Amer: >60 mL/min (ref >60)
GFR calc non Af Amer: >60 mL/min (ref >60)
GLUCOSE: 107 mg/dL — AB (ref 65–99)
POTASSIUM: 3.9 mmol/L (ref 3.5–5.1)
SODIUM: 138 mmol/L (ref 135–145)

## 2015-02-05 SURGERY — EGD (ESOPHAGOGASTRODUODENOSCOPY)
Anesthesia: Moderate Sedation

## 2015-02-05 MED ORDER — FENTANYL CITRATE (PF) 100 MCG/2ML IJ SOLN
INTRAMUSCULAR | Status: AC
  Filled 2015-02-05: qty 2

## 2015-02-05 MED ORDER — STERILE WATER FOR IRRIGATION IR SOLN
Status: DC | PRN
  Administered 2015-02-05: 13:00:00

## 2015-02-05 MED ORDER — FENTANYL CITRATE (PF) 100 MCG/2ML IJ SOLN
INTRAMUSCULAR | Status: DC | PRN
  Administered 2015-02-05 (×2): 25 ug via INTRAVENOUS

## 2015-02-05 MED ORDER — SODIUM CHLORIDE 0.9 % IV SOLN
INTRAVENOUS | Status: DC
  Administered 2015-02-05: 20 mL/h via INTRAVENOUS

## 2015-02-05 MED ORDER — MEPERIDINE HCL 50 MG/ML IJ SOLN: INTRAMUSCULAR | Status: DC | PRN

## 2015-02-05 MED ORDER — MIDAZOLAM HCL 5 MG/5ML IJ SOLN
INTRAMUSCULAR | Status: DC | PRN
  Administered 2015-02-05: 1 mg via INTRAVENOUS
  Administered 2015-02-05: 2 mg via INTRAVENOUS

## 2015-02-05 MED ORDER — MIDAZOLAM HCL 5 MG/5ML IJ SOLN
INTRAMUSCULAR | Status: AC
Start: 2015-02-05 — End: 2015-02-06
  Filled 2015-02-05: qty 10

## 2015-02-05 MED ORDER — BUTAMBEN-TETRACAINE-BENZOCAINE 2-2-14 % EX AERO
INHALATION_SPRAY | CUTANEOUS | Status: DC | PRN
  Administered 2015-02-05: 2 via TOPICAL

## 2015-02-05 NOTE — Progress Notes (Signed)
Patient refuses potassium pills. K 3.9 today. Dr. Luan Pulling notified.

## 2015-02-05 NOTE — Progress Notes (Signed)
Subjective: She says she feels better. She has no new complaints. She is scheduled for EGD today  Objective: Vital signs in last 24 hours: Temp:  [98.1 F (36.7 C)-98.7 F (37.1 C)] 98.5 F (36.9 C) (01/11 0545) Pulse Rate:  [67-142] 67 (01/11 0545) Resp:  [18-20] 20 (01/11 0545) BP: (138-176)/(67-83) 156/83 mmHg (01/11 0545) SpO2:  [92 %-96 %] 93 % (01/11 0545) Weight change:  Last BM Date: 02/03/15  Intake/Output from previous day: 01/10 0701 - 01/11 0700 In: 1025.5 [P.O.:130; I.V.:787.5; IV Piggyback:108] Out: 2600 [Urine:2500; Emesis/NG output:100]  PHYSICAL EXAM General appearance: alert, cooperative and mild distress Resp: clear to auscultation bilaterally Cardio: regular rate and rhythm, S1, S2 normal, no murmur, click, rub or gallop GI: soft, non-tender; bowel sounds normal; no masses,  no organomegaly Extremities: extremities normal, atraumatic, no cyanosis or edema  Lab Results:  Results for orders placed or performed during the hospital encounter of 01/30/15 (from the past 48 hour(s))  Glucose, capillary     Status: Abnormal   Collection Time: 02/03/15 11:55 AM  Result Value Ref Range   Glucose-Capillary 146 (H) 65 - 99 mg/dL   Comment 1 Notify RN   Glucose, capillary     Status: Abnormal   Collection Time: 02/03/15  4:51 PM  Result Value Ref Range   Glucose-Capillary 147 (H) 65 - 99 mg/dL  Glucose, capillary     Status: Abnormal   Collection Time: 02/03/15  7:59 PM  Result Value Ref Range   Glucose-Capillary 127 (H) 65 - 99 mg/dL  Glucose, capillary     Status: Abnormal   Collection Time: 02/04/15  7:50 AM  Result Value Ref Range   Glucose-Capillary 158 (H) 65 - 99 mg/dL   Comment 1 Notify RN   Basic metabolic panel     Status: Abnormal   Collection Time: 02/04/15  8:32 AM  Result Value Ref Range   Sodium 135 135 - 145 mmol/L   Potassium 3.5 3.5 - 5.1 mmol/L    Comment: DELTA CHECK NOTED   Chloride 103 101 - 111 mmol/L   CO2 22 22 - 32 mmol/L   Glucose, Bld 191 (H) 65 - 99 mg/dL   BUN 9 6 - 20 mg/dL   Creatinine, Ser 0.74 0.44 - 1.00 mg/dL   Calcium 8.7 (L) 8.9 - 10.3 mg/dL   GFR calc non Af Amer >60 >60 mL/min   GFR calc Af Amer >60 >60 mL/min    Comment: (NOTE) The eGFR has been calculated using the CKD EPI equation. This calculation has not been validated in all clinical situations. eGFR's persistently <60 mL/min signify possible Chronic Kidney Disease.    Anion gap 10 5 - 15  Glucose, capillary     Status: Abnormal   Collection Time: 02/04/15 11:54 AM  Result Value Ref Range   Glucose-Capillary 138 (H) 65 - 99 mg/dL  Glucose, capillary     Status: Abnormal   Collection Time: 02/04/15  4:35 PM  Result Value Ref Range   Glucose-Capillary 192 (H) 65 - 99 mg/dL   Comment 1 Notify RN   Glucose, capillary     Status: Abnormal   Collection Time: 02/04/15  9:33 PM  Result Value Ref Range   Glucose-Capillary 134 (H) 65 - 99 mg/dL   Comment 1 Notify RN    Comment 2 Document in Chart   Basic metabolic panel     Status: Abnormal   Collection Time: 02/05/15  7:00 AM  Result Value Ref Range  Sodium 138 135 - 145 mmol/L   Potassium 3.9 3.5 - 5.1 mmol/L   Chloride 105 101 - 111 mmol/L   CO2 25 22 - 32 mmol/L   Glucose, Bld 107 (H) 65 - 99 mg/dL   BUN 10 6 - 20 mg/dL   Creatinine, Ser 0.81 0.44 - 1.00 mg/dL   Calcium 8.9 8.9 - 10.3 mg/dL   GFR calc non Af Amer >60 >60 mL/min   GFR calc Af Amer >60 >60 mL/min    Comment: (NOTE) The eGFR has been calculated using the CKD EPI equation. This calculation has not been validated in all clinical situations. eGFR's persistently <60 mL/min signify possible Chronic Kidney Disease.    Anion gap 8 5 - 15  Glucose, capillary     Status: Abnormal   Collection Time: 02/05/15  7:52 AM  Result Value Ref Range   Glucose-Capillary 100 (H) 65 - 99 mg/dL    ABGS No results for input(s): PHART, PO2ART, TCO2, HCO3 in the last 72 hours.  Invalid input(s): PCO2 CULTURES Recent Results  (from the past 240 hour(s))  MRSA PCR Screening     Status: None   Collection Time: 01/30/15  6:40 PM  Result Value Ref Range Status   MRSA by PCR NEGATIVE NEGATIVE Final    Comment:        The GeneXpert MRSA Assay (FDA approved for NASAL specimens only), is one component of a comprehensive MRSA colonization surveillance program. It is not intended to diagnose MRSA infection nor to guide or monitor treatment for MRSA infections.    Studies/Results: No results found.  Medications:  Prior to Admission:  Prescriptions prior to admission  Medication Sig Dispense Refill Last Dose  . aspirin EC 81 MG tablet Take 81 mg by mouth daily.   01/29/2015 at Unknown time  . gabapentin (NEURONTIN) 300 MG capsule Take 1,200 mg by mouth at bedtime.    01/28/2015  . glipiZIDE (GLUCOTROL) 10 MG tablet Take 10 mg by mouth 2 (two) times daily before a meal.    01/29/2015 at Unknown time  . Insulin Detemir (LEVEMIR FLEXPEN) 100 UNIT/ML Pen Inject 20 Units into the skin at bedtime.   01/29/2015 at Unknown time  . Multiple Vitamins-Minerals (CENTRUM SILVER PO) Take 1 tablet by mouth daily.   01/28/2015  . naproxen sodium (ALEVE) 220 MG tablet Take 220 mg by mouth 2 (two) times daily as needed (pain).   01/27/2015  . pantoprazole (PROTONIX) 40 MG tablet Take 40 mg by mouth daily.    01/29/2015 at Unknown time  . pravastatin (PRAVACHOL) 40 MG tablet Take 40 mg by mouth at bedtime.    01/28/2015  . verapamil (CALAN-SR) 240 MG CR tablet Take 240 mg by mouth daily with breakfast.    01/29/2015 at Unknown time  . docusate sodium (COLACE) 100 MG capsule Take 1 capsule (100 mg total) by mouth daily. (Patient not taking: Reported on 11/30/2014) 1 capsule 0   . insulin detemir (LEVEMIR) 100 UNIT/ML injection Inject 0.12 mLs (12 Units total) into the skin daily. (Patient not taking: Reported on 11/30/2014) 10 mL 11 Taking  . lidocaine (LIDODERM) 5 % Place 1 patch onto the skin daily. Remove & Discard patch within 12 hours or as directed  by MD (Patient not taking: Reported on 11/30/2014) 30 patch 1   . oxyCODONE-acetaminophen (PERCOCET/ROXICET) 5-325 MG tablet Take 1 tablet by mouth every 6 (six) hours as needed. (Patient not taking: Reported on 01/30/2015) 30 tablet 0 Not Taking at  Unknown time   Scheduled: . citalopram  20 mg Oral Daily  . enoxaparin (LOVENOX) injection  40 mg Subcutaneous Q24H  . insulin aspart  0-20 Units Subcutaneous TID WC  . insulin aspart  0-5 Units Subcutaneous QHS  . insulin detemir  25 Units Subcutaneous QHS  . metoCLOPramide (REGLAN) injection  10 mg Intravenous TID AC & HS  . metoprolol tartrate  50 mg Oral BID  . ondansetron (ZOFRAN) IV  8 mg Intravenous 4 times per day  . pantoprazole  40 mg Oral BID  . potassium chloride  40 mEq Oral TID  . sodium chloride  3 mL Intravenous Q12H   Continuous: . sodium chloride 75 mL/hr at 02/05/15 0510   WPY:[id number], hydrALAZINE, ondansetron **OR** ondansetron (ZOFRAN) IV  Assesment: She has intractable nausea and vomiting which is better. She does have gastroparesis at baseline. She is scheduled for EGD today. Her nausea and vomiting are better. She had hypokalemia on admission. This is improved. I think she was mildly dehydrated and that is better. She has depression at baseline and is on treatment. Active Problems:   Diabetes ([place])   Essential hypertension   Gastroparesis   Intractable nausea and vomiting   Hypokalemia   Depression    Plan: EGD today. It has been recommended that she go to skilled care facility for rehabilitation and that may be able to be done tomorrow    LOS: 6 days   Daylen Lipsky L 02/05/2015, 9:51 AM

## 2015-02-05 NOTE — Op Note (Addendum)
EGD PROCEDURE REPORT  PATIENT:  Lori Mahoney  MR#:  [id number] Birthdate:  [birth year], 70 y.o., female Endoscopist:  Dr. [name], MD Referred By:  Dr. Alonza Bogus, MD. Procedure Date: 02/05/2015  Procedure:   EGD  Indications:  Patient is 70 year old Caucasian female who was admitted 5 days ago with nausea and vomiting in the setting of mild DKA. She has history of diabetic gastroparesis but has not responded to therapy. She is undergoing diagnostic EGD.            Informed Consent:  The risks, benefits, alternatives & imponderables which include, but are not limited to, bleeding, infection, perforation, drug reaction and potential missed lesion have been reviewed.  The potential for biopsy, lesion removal, esophageal dilation, etc. have also been discussed.  Questions have been answered.  All parties agreeable.  Please see history & physical in medical record for more information.  Medications:  Demerol 50 mg IV Versed 3 mg IV Cetacaine spray topically for oropharyngeal anesthesia Moderate sedation started @12 :55 PM procedure ended-scope out @13 :07 PM.   Description of procedure:  The endoscope was introduced through the mouth and advanced to the second portion of the duodenum without difficulty or limitations. The mucosal surfaces were surveyed very carefully during advancement of the scope and upon withdrawal.  Findings:  Esophagus:  Mucosa of the esophagus was normal. Linear extrinsic compression noted at proximal esophagus without luminal narrowing at 21 cm from the incisors. Focal erythema noted to GE junction without erosions or ulceration. GEJ:  40 cm Stomach:  Stomach was empty and distended very well with insufflation. Folds in the proximal stomach were normal. Examination of mucosa at gastric body, antrum, pyloric channel angularis fundus and cardia was normal. Duodenum:  Normal bulbar and post bulbar mucosa.  Therapeutic/Diagnostic Maneuvers Performed:   None  Complications:  None  EBL: None  Impression: Extrinsic impression at proximal esophagus without luminal compromise or narrowing most likely due to blood vessel or osteophyte. Mild changes of reflux esophagitis limited to GE junction. No evidence of peptic ulcer disease or pyloric stenosis.  Comment: As discussed with Dr. Luan Pulling will proceed with MRI of brain without contrast because she's been complaining of right frontal headache and has had few falls recently. If MR of brain is negative and proceed with abdominopelvic CT with oral contrast.  Recommendations:  MR brain without contrast as discussed with Dr. Luan Pulling. Full liquid diet.  Costa Jha U  02/05/2015  1:20 PM  CC: Dr. [name], MD & Dr. Rayne Du ref. provider found

## 2015-02-05 NOTE — Progress Notes (Signed)
Patient has order for MRE of head. Patient is unable to have MRI due stapedectomy in the past. Dr. Laural Golden notified. New orders for CT head without contrast.

## 2015-02-06 ENCOUNTER — Inpatient Hospital Stay (HOSPITAL_COMMUNITY): Payer: Medicare Other

## 2015-02-06 ENCOUNTER — Inpatient Hospital Stay
Admission: RE | Admit: 2015-02-06 | Discharge: 2015-02-11 | Disposition: A | Discharge status: Nursing Home (Includes ICF) | Payer: Medicare Other | Source: Ambulatory Visit | Attending: Pulmonary Disease | Admitting: Pulmonary Disease

## 2015-02-06 DIAGNOSIS — E860 Dehydration: Secondary | ICD-10-CM

## 2015-02-06 DIAGNOSIS — K210 Gastro-esophageal reflux disease with esophagitis: Secondary | ICD-10-CM

## 2015-02-06 LAB — CREATININE, SERUM: Creatinine, Ser: 0.87 mg/dL (ref 0.44–1.00)

## 2015-02-06 LAB — GLUCOSE, CAPILLARY
GLUCOSE-CAPILLARY: 125 mg/dL — AB (ref 65–99)
GLUCOSE-CAPILLARY: 105 mg/dL — AB (ref 65–99)
GLUCOSE-CAPILLARY: 221 mg/dL — AB (ref 65–99)

## 2015-02-06 MED ORDER — INSULIN ASPART 100 UNIT/ML ~~LOC~~ SOLN: 0.0000 [IU] | Freq: Every day | SUBCUTANEOUS | Status: DC

## 2015-02-06 MED ORDER — METOCLOPRAMIDE HCL 10 MG PO TABS: 10.0000 mg | ORAL_TABLET | Freq: Three times a day (TID) | ORAL | Status: DC

## 2015-02-06 MED ORDER — METOPROLOL TARTRATE 50 MG PO TABS
50.0000 mg | ORAL_TABLET | Freq: Two times a day (BID) | ORAL | Status: AC
Start: 2015-02-06 — End: ?

## 2015-02-06 MED ORDER — POTASSIUM CHLORIDE CRYS ER 20 MEQ PO TBCR: 40.0000 meq | EXTENDED_RELEASE_TABLET | Freq: Every day | ORAL | Status: DC

## 2015-02-06 MED ORDER — CITALOPRAM HYDROBROMIDE 20 MG PO TABS: 20.0000 mg | ORAL_TABLET | Freq: Every day | ORAL | Status: DC

## 2015-02-06 MED ORDER — PANTOPRAZOLE SODIUM 40 MG PO TBEC: 40.0000 mg | DELAYED_RELEASE_TABLET | Freq: Two times a day (BID) | ORAL | Status: AC

## 2015-02-06 MED ORDER — ONDANSETRON HCL 4 MG PO TABS
8.0000 mg | ORAL_TABLET | Freq: Three times a day (TID) | ORAL | Status: DC
  Administered 2015-02-06: 8 mg via ORAL
  Filled 2015-02-06: qty 2

## 2015-02-06 MED ORDER — ACETAMINOPHEN 325 MG PO TABS: 650.0000 mg | ORAL_TABLET | Freq: Four times a day (QID) | ORAL | Status: AC | PRN

## 2015-02-06 MED ORDER — ONDANSETRON HCL 4 MG PO TABS: 4.0000 mg | ORAL_TABLET | Freq: Four times a day (QID) | ORAL | Status: DC | PRN

## 2015-02-06 MED ORDER — INSULIN DETEMIR 100 UNIT/ML ~~LOC~~ SOLN: 25.0000 [IU] | Freq: Every day | SUBCUTANEOUS | Status: AC

## 2015-02-06 MED ORDER — METOCLOPRAMIDE HCL 10 MG PO TABS
10.0000 mg | ORAL_TABLET | Freq: Three times a day (TID) | ORAL | Status: DC
  Administered 2015-02-06: 10 mg via ORAL
  Filled 2015-02-06: qty 1

## 2015-02-06 MED ORDER — DIATRIZOATE MEGLUMINE & SODIUM 66-10 % PO SOLN
30.0000 mL | Freq: Once | ORAL | Status: AC
Start: 2015-02-06 — End: 2015-02-06
  Administered 2015-02-06: 30 mL via ORAL

## 2015-02-06 MED ORDER — INSULIN ASPART 100 UNIT/ML ~~LOC~~ SOLN: 0.0000 [IU] | Freq: Three times a day (TID) | SUBCUTANEOUS | Status: DC

## 2015-02-06 MED ORDER — ONDANSETRON HCL 8 MG PO TABS: 8.0000 mg | ORAL_TABLET | Freq: Three times a day (TID) | ORAL | Status: DC

## 2015-02-06 NOTE — Progress Notes (Signed)
Patient ID: Lori Mahoney, female   DOB: [birth year], 70 y.o.   MRN: [id number] More alert today. Feels better. Kept her diet down last night.  Daughter in room with patient. No BM in 2 days. She denies any N or V. Blood pressure 176/86, pulse 61, temperature 99.4 F (37.4 C), temperature source Oral, resp. rate 20, height 5' 3.5" (1.613 m), weight 191 lb 9.3 oz (86.9 kg), SpO2 95 %. Gastroparesis. She is doing better. Has bed ready at [facility] for Rehab. Plan CT abdomen/pelvis with oral contrast.

## 2015-02-06 NOTE — Progress Notes (Signed)
Patient with orders to be discharge to [facility]. Report called to [facility], nurse. Patient stable. Patient transported via staff.

## 2015-02-06 NOTE — Care Management Note (Signed)
Case Management Note  Patient Details  Name: Lori Mahoney MRN: [id number] Date of Birth: Jan 23, 1946  Expected Discharge Date:                  Expected Discharge Plan:  Skilled Nursing Facility  In-House Referral:  Clinical Social Work  Discharge planning Services  CM Consult  Post Acute Care Choice:  NA Choice offered to:  NA  DME Arranged:    DME Agency:     HH Arranged:    [place] Agency:     Status of Service:  Completed, signed off  Medicare Important Message Given:  Yes Date Medicare IM Given:    Medicare IM give by:    Date Additional Medicare IM Given:    Additional Medicare Important Message give by:     If discussed at [place] of Stay Meetings, dates discussed:  02/06/2015  Additional Comments: Pt discharging today to SNF. CSW has arranged for placement. No CM needs.   Sherald Barge, RN 02/06/2015, 9:30 AM

## 2015-02-06 NOTE — Care Management Important Message (Signed)
Important Message  Patient Details  Name: Lori Mahoney MRN: [id number] Date of Birth: March 17, 1945   Medicare Important Message Given:  Yes    Sherald Barge, RN 02/06/2015, 9:30 AM

## 2015-02-06 NOTE — Clinical Social Work Placement (Signed)
   CLINICAL SOCIAL WORK PLACEMENT  NOTE  Date:  02/06/2015  Patient Details  Name: Lori Mahoney MRN: [id number] Date of Birth: [birth year]  Clinical Social Work is seeking post-discharge placement for this patient at the [place] level of care (*CSW will initial, date and re-position this form in  chart as items are completed):  Yes   Patient/family provided with [place] Work Department's list of facilities offering this level of care within the geographic area requested by the patient (or if unable, by the patient's family).  Yes   Patient/family informed of their freedom to choose among providers that offer the needed level of care, that participate in Medicare, Medicaid or managed care program needed by the patient, have an available bed and are willing to accept the patient.  Yes   Patient/family informed of [place]'s ownership interest in [facility] and [facility], as well as of the fact that they are under no obligation to receive care at these facilities.  PASRR submitted to EDS on       PASRR number received on       Existing PASRR number confirmed on 02/04/15     FL2 transmitted to all facilities in geographic area requested by pt/family on 02/04/15     FL2 transmitted to all facilities within larger geographic area on       Patient informed that his/her managed care company has contracts with or will negotiate with certain facilities, including the following:        Yes   Patient/family informed of bed offers received.  Patient chooses bed at [facility]     Physician recommends and patient chooses bed at      Patient to be transferred to [facility] on 02/06/15.  Patient to be transferred to facility by staff     Patient family notified on 02/06/15 of transfer.  Name of family member notified:  pt states her daughters are already aware of d/c and no need to call     PHYSICIAN       Additional  Comment:    _______________________________________________ Salome Arnt, [place] 02/06/2015, 11:04 AM [phone]

## 2015-02-06 NOTE — Discharge Summary (Addendum)
Physician Discharge Summary  Patient ID: Lori Mahoney MRN: [id number] DOB/AGE: [birth year] 70 y.o. Primary Care Physician:REHMAN,NAJEEB U, MD Admit date: 01/30/2015 Discharge date: 02/06/2015    Discharge Diagnoses:   Active Problems:   Diabetes ([place])   Essential hypertension   Gastroparesis   Intractable nausea and vomiting   Hypokalemia   Depression   Dehydration     Discharged Condition: Improved    Consults: GI  Significant Diagnostic Studies: Ct Head Wo Contrast  02/05/2015  CLINICAL DATA:  Headaches since 01/30/2015, history hypertension, diabetes mellitus, stroke EXAM: CT HEAD WITHOUT CONTRAST TECHNIQUE: Contiguous axial images were obtained from the base of the skull through the vertex without intravenous contrast. COMPARISON:  01/12/2012 FINDINGS: Minimal atrophy. Normal ventricular morphology. No midline shift or mass effect. Small vessel chronic ischemic changes of deep cerebral white matter. Small old lacunar infarct RIGHT thalamus. No intracranial hemorrhage, mass lesion, or evidence acute infarction. No extra-axial fluid collections. Visualized paranasal sinuses and mastoid air cells clear. No acute osseous findings. IMPRESSION: Progressive small vessel chronic ischemic changes of deep cerebral white matter. Small old lacunar infarct RIGHT thalamus. No acute intracranial abnormalities. Electronically Signed   By: Lavonia Dana M.D.   On: 02/05/2015 17:32   Dg Abd Acute W/chest  01/30/2015  CLINICAL DATA:  Complaining of gradual onset, constant, nausea, vomiting, and diarrhea x 2 days. Pt reports 8 episodes of diarrhea since onset and vomiting every 1.5 hours. She also complains of , headache, cough, and pain underneath her right breast. EXAM: DG ABDOMEN ACUTE W/ 1V CHEST COMPARISON:  None. FINDINGS: There is no evidence of dilated bowel loops or free intraperitoneal air. No radiopaque calculi or other significant radiographic abnormality is seen. Heart size and mediastinal  contours are within normal limits. Both lungs are clear. Posterior spinal fusion hardware. Bilateral shoulder arthroplasties. IMPRESSION: Negative abdominal radiographs.  No acute cardiopulmonary disease. Electronically Signed   By: Kathreen Devoid   On: 01/30/2015 12:48    Lab Results: Basic Metabolic Panel:  Recent Labs  02/04/15 0832 02/05/15 0700 02/06/15 0611  NA 135 138  --   K 3.5 3.9  --   CL 103 105  --   CO2 22 25  --   GLUCOSE 191* 107*  --   BUN 9 10  --   CREATININE 0.74 0.81 0.87  CALCIUM 8.7* 8.9  --    Liver Function Tests: No results for input(s): AST, ALT, ALKPHOS, BILITOT, PROT, ALBUMIN in the last 72 hours.   CBC: No results for input(s): WBC, NEUTROABS, HGB, HCT, MCV, PLT in the last 72 hours.  Recent Results (from the past 240 hour(s))  MRSA PCR Screening     Status: None   Collection Time: 01/30/15  6:40 PM  Result Value Ref Range Status   MRSA by PCR NEGATIVE NEGATIVE Final    Comment:        The GeneXpert MRSA Assay (FDA approved for NASAL specimens only), is one component of a comprehensive MRSA colonization surveillance program. It is not intended to diagnose MRSA infection nor to guide or monitor treatment for MRSA infections.      Hospital Course: This is a 70 year old who had been at home with significant problems with nausea and intractable vomiting. She is known to have gastroparesis. Her blood sugar had been poorly controlled. According to her family she has been neglecting herself. She was treated with Reglan and Zofran and improved. She had CT of the brain to make sure she didn't  have a new brain lesion and this did not show anything new. She had CT of the abdomen and pelvis with oral contrast. Her blood sugar was under much better control. She was able to eat and keep food down. She had evaluation it was felt that she needed skilled care facility for rehabilitation which I think is appropriate  Discharge Exam: Blood pressure 176/86,  pulse 61, temperature 99.4 F (37.4 C), temperature source Oral, resp. rate 20, height 5' 3.5" (1.613 m), weight 86.9 kg (191 lb 9.3 oz), SpO2 95 %. She is awake and alert. She looks more comfortable. Her chest is clear. Abdomen is soft  Disposition: To skilled care facility for rehabilitation. She will need PT OT and speech if needed. She will be on sliding scale insulin. She will be on 10 mg of Reglan before meals and at bedtime for 7 days and then 5 mg before meals and at bedtime after that. She will need basic metabolic profile on [id number]. She will be on diabetic diet with soft foods       Medication List    STOP taking these medications        ALEVE 220 MG tablet  Generic drug:  naproxen sodium     docusate sodium 100 MG capsule  Commonly known as:  COLACE     glipiZIDE 10 MG tablet  Commonly known as:  GLUCOTROL      TAKE these medications        acetaminophen 325 MG tablet  Commonly known as:  TYLENOL  Take 2 tablets (650 mg total) by mouth every 6 (six) hours as needed for mild pain, moderate pain, fever or headache.     aspirin EC 81 MG tablet  Take 81 mg by mouth daily.     CENTRUM SILVER PO  Take 1 tablet by mouth daily.     citalopram 20 MG tablet  Commonly known as:  CELEXA  Take 1 tablet (20 mg total) by mouth daily.     gabapentin 300 MG capsule  Commonly known as:  NEURONTIN  Take 1,200 mg by mouth at bedtime.     insulin aspart 100 UNIT/ML injection  Commonly known as:  novoLOG  Inject 0-20 Units into the skin 3 (three) times daily with meals.     insulin aspart 100 UNIT/ML injection  Commonly known as:  novoLOG  Inject 0-5 Units into the skin at bedtime.     insulin detemir 100 UNIT/ML injection  Commonly known as:  LEVEMIR  Inject 0.25 mLs (25 Units total) into the skin at bedtime.     lidocaine 5 %  Commonly known as:  LIDODERM  Place 1 patch onto the skin daily. Remove & Discard patch within 12 hours or as directed by MD      metoCLOPramide 10 MG tablet  Commonly known as:  REGLAN  Take 1 tablet (10 mg total) by mouth 4 (four) times daily -  before meals and at bedtime.     metoprolol 50 MG tablet  Commonly known as:  LOPRESSOR  Take 1 tablet (50 mg total) by mouth 2 (two) times daily.     ondansetron 4 MG tablet  Commonly known as:  ZOFRAN  Take 1 tablet (4 mg total) by mouth every 6 (six) hours as needed for nausea.     ondansetron 8 MG tablet  Commonly known as:  ZOFRAN  Take 1 tablet (8 mg total) by mouth 4 (four) times daily -  before meals and  at bedtime.     oxyCODONE-acetaminophen 5-325 MG tablet  Commonly known as:  PERCOCET/ROXICET  Take 1 tablet by mouth every 6 (six) hours as needed.     pantoprazole 40 MG tablet  Commonly known as:  PROTONIX  Take 1 tablet (40 mg total) by mouth 2 (two) times daily.     potassium chloride SA 20 MEQ tablet  Commonly known as:  K-DUR,KLOR-CON  Take 2 tablets (40 mEq total) by mouth daily.     pravastatin 40 MG tablet  Commonly known as:  PRAVACHOL  Take 40 mg by mouth at bedtime.     verapamil 240 MG CR tablet  Commonly known as:  CALAN-SR  Take 240 mg by mouth daily with breakfast.          Signed: Myreon Wimer L   02/06/2015, 9:02 AM

## 2015-02-06 NOTE — Progress Notes (Signed)
Subjective: She feels better. She was able to keep her food down yesterday. CT brain did not show anything obvious except an old stroke. She looks much better today.  Objective: Vital signs in last 24 hours: Temp:  [98.8 F (37.1 C)-99.5 F (37.5 C)] 99.4 F (37.4 C) (01/12 0600) Pulse Rate:  [56-77] 61 (01/12 0600) Resp:  [15-22] 20 (01/12 0600) BP: (124-187)/(74-97) 176/86 mmHg (01/12 0600) SpO2:  [90 %-97 %] 95 % (01/12 0600) Weight change:  Last BM Date: 02/03/15  Intake/Output from previous day: 01/11 0701 - 01/12 0700 In: 120 [P.O.:120] Out: 1050 [Urine:1050]  PHYSICAL EXAM General appearance: alert, cooperative and no distress Resp: clear to auscultation bilaterally Cardio: regular rate and rhythm, S1, S2 normal, no murmur, click, rub or gallop GI: soft, non-tender; bowel sounds normal; no masses,  no organomegaly Extremities: extremities normal, atraumatic, no cyanosis or edema  Lab Results:  Results for orders placed or performed during the hospital encounter of 01/30/15 (from the past 48 hour(s))  Glucose, capillary     Status: Abnormal   Collection Time: 02/04/15 11:54 AM  Result Value Ref Range   Glucose-Capillary 138 (H) 65 - 99 mg/dL  Glucose, capillary     Status: Abnormal   Collection Time: 02/04/15  4:35 PM  Result Value Ref Range   Glucose-Capillary 192 (H) 65 - 99 mg/dL   Comment 1 Notify RN   Glucose, capillary     Status: Abnormal   Collection Time: 02/04/15  9:33 PM  Result Value Ref Range   Glucose-Capillary 134 (H) 65 - 99 mg/dL   Comment 1 Notify RN    Comment 2 Document in Chart   Basic metabolic panel     Status: Abnormal   Collection Time: 02/05/15  7:00 AM  Result Value Ref Range   Sodium 138 135 - 145 mmol/L   Potassium 3.9 3.5 - 5.1 mmol/L   Chloride 105 101 - 111 mmol/L   CO2 25 22 - 32 mmol/L   Glucose, Bld 107 (H) 65 - 99 mg/dL   BUN 10 6 - 20 mg/dL   Creatinine, Ser 0.81 0.44 - 1.00 mg/dL   Calcium 8.9 8.9 - 10.3 mg/dL   GFR  calc non Af Amer >60 >60 mL/min   GFR calc Af Amer >60 >60 mL/min    Comment: (NOTE) The eGFR has been calculated using the CKD EPI equation. This calculation has not been validated in all clinical situations. eGFR's persistently <60 mL/min signify possible Chronic Kidney Disease.    Anion gap 8 5 - 15  Glucose, capillary     Status: Abnormal   Collection Time: 02/05/15  7:52 AM  Result Value Ref Range   Glucose-Capillary 100 (H) 65 - 99 mg/dL  Glucose, capillary     Status: Abnormal   Collection Time: 02/05/15  4:46 PM  Result Value Ref Range   Glucose-Capillary 119 (H) 65 - 99 mg/dL  Glucose, capillary     Status: Abnormal   Collection Time: 02/05/15 10:16 PM  Result Value Ref Range   Glucose-Capillary 151 (H) 65 - 99 mg/dL   Comment 1 Notify RN    Comment 2 Document in Chart   Creatinine, serum     Status: None   Collection Time: 02/06/15  6:11 AM  Result Value Ref Range   Creatinine, Ser 0.87 0.44 - 1.00 mg/dL   GFR calc non Af Amer >60 >60 mL/min   GFR calc Af Amer >60 >60 mL/min    Comment: (  NOTE) The eGFR has been calculated using the CKD EPI equation. This calculation has not been validated in all clinical situations. eGFR's persistently <60 mL/min signify possible Chronic Kidney Disease.   Glucose, capillary     Status: Abnormal   Collection Time: 02/06/15  7:26 AM  Result Value Ref Range   Glucose-Capillary 105 (H) 65 - 99 mg/dL    ABGS No results for input(s): PHART, PO2ART, TCO2, HCO3 in the last 72 hours.  Invalid input(s): PCO2 CULTURES Recent Results (from the past 240 hour(s))  MRSA PCR Screening     Status: None   Collection Time: 01/30/15  6:40 PM  Result Value Ref Range Status   MRSA by PCR NEGATIVE NEGATIVE Final    Comment:        The GeneXpert MRSA Assay (FDA approved for NASAL specimens only), is one component of a comprehensive MRSA colonization surveillance program. It is not intended to diagnose MRSA infection nor to guide  or monitor treatment for MRSA infections.    Studies/Results: Ct Head Wo Contrast  02/05/2015  CLINICAL DATA:  Headaches since 01/30/2015, history hypertension, diabetes mellitus, stroke EXAM: CT HEAD WITHOUT CONTRAST TECHNIQUE: Contiguous axial images were obtained from the base of the skull through the vertex without intravenous contrast. COMPARISON:  01/12/2012 FINDINGS: Minimal atrophy. Normal ventricular morphology. No midline shift or mass effect. Small vessel chronic ischemic changes of deep cerebral white matter. Small old lacunar infarct RIGHT thalamus. No intracranial hemorrhage, mass lesion, or evidence acute infarction. No extra-axial fluid collections. Visualized paranasal sinuses and mastoid air cells clear. No acute osseous findings. IMPRESSION: Progressive small vessel chronic ischemic changes of deep cerebral white matter. Small old lacunar infarct RIGHT thalamus. No acute intracranial abnormalities. Electronically Signed   By: Lavonia Dana M.D.   On: 02/05/2015 17:32    Medications:  Prior to Admission:  Prescriptions prior to admission  Medication Sig Dispense Refill Last Dose  . aspirin EC 81 MG tablet Take 81 mg by mouth daily.   01/29/2015 at Unknown time  . gabapentin (NEURONTIN) 300 MG capsule Take 1,200 mg by mouth at bedtime.    01/28/2015  . glipiZIDE (GLUCOTROL) 10 MG tablet Take 10 mg by mouth 2 (two) times daily before a meal.    01/29/2015 at Unknown time  . Insulin Detemir (LEVEMIR FLEXPEN) 100 UNIT/ML Pen Inject 20 Units into the skin at bedtime.   01/29/2015 at Unknown time  . Multiple Vitamins-Minerals (CENTRUM SILVER PO) Take 1 tablet by mouth daily.   01/28/2015  . naproxen sodium (ALEVE) 220 MG tablet Take 220 mg by mouth 2 (two) times daily as needed (pain).   01/27/2015  . pantoprazole (PROTONIX) 40 MG tablet Take 40 mg by mouth daily.    01/29/2015 at Unknown time  . pravastatin (PRAVACHOL) 40 MG tablet Take 40 mg by mouth at bedtime.    01/28/2015  . verapamil  (CALAN-SR) 240 MG CR tablet Take 240 mg by mouth daily with breakfast.    01/29/2015 at Unknown time  . docusate sodium (COLACE) 100 MG capsule Take 1 capsule (100 mg total) by mouth daily. (Patient not taking: Reported on 11/30/2014) 1 capsule 0   . insulin detemir (LEVEMIR) 100 UNIT/ML injection Inject 0.12 mLs (12 Units total) into the skin daily. (Patient not taking: Reported on 11/30/2014) 10 mL 11 Taking  . lidocaine (LIDODERM) 5 % Place 1 patch onto the skin daily. Remove & Discard patch within 12 hours or as directed by MD (Patient not taking: Reported  on 11/30/2014) 30 patch 1   . oxyCODONE-acetaminophen (PERCOCET/ROXICET) 5-325 MG tablet Take 1 tablet by mouth every 6 (six) hours as needed. (Patient not taking: Reported on 01/30/2015) 30 tablet 0 Not Taking at Unknown time   Scheduled: . citalopram  20 mg Oral Daily  . enoxaparin (LOVENOX) injection  40 mg Subcutaneous Q24H  . insulin aspart  0-20 Units Subcutaneous TID WC  . insulin aspart  0-5 Units Subcutaneous QHS  . insulin detemir  25 Units Subcutaneous QHS  . metoCLOPramide (REGLAN) injection  10 mg Intravenous TID AC & HS  . metoprolol tartrate  50 mg Oral BID  . ondansetron (ZOFRAN) IV  8 mg Intravenous 4 times per day  . pantoprazole  40 mg Oral BID  . potassium chloride  40 mEq Oral TID  . sodium chloride  3 mL Intravenous Q12H   Continuous: . sodium chloride 75 mL/hr at 02/05/15 0510  . sodium chloride 20 mL/hr (02/05/15 1234)   K[facility]:[id number], hydrALAZINE, ondansetron **OR** ondansetron (ZOFRAN) IV  Assesment: She was admitted with intractable nausea and vomiting probably related to gastroparesis. She is treated and doing well. She has no new complaints. She was able to keep her food down yesterday. It has been recommended that she go to skilled care facility which I think is appropriate. Active Problems:   Diabetes ([place])   Essential hypertension   Gastroparesis   Intractable nausea and vomiting   Hypokalemia    Depression    Plan: Switch to oral meds. Transfer to skilled care facility today if oral contrasted CT abdomen and pelvis does not show anything that needs hospital treatment    LOS: 7 days   Nas Wafer L 02/06/2015, 8:54 AM

## 2015-02-07 ENCOUNTER — Encounter (HOSPITAL_COMMUNITY): Payer: Self-pay | Admitting: Internal Medicine

## 2015-02-08 ENCOUNTER — Encounter (HOSPITAL_COMMUNITY)
Admission: AD | Admit: 2015-02-08 | Discharge: 2015-02-08 | Disposition: A | Discharge status: Home / Self Care | Payer: Medicare Other | Source: Skilled Nursing Facility | Attending: Pulmonary Disease | Admitting: Pulmonary Disease

## 2015-02-08 DIAGNOSIS — I10 Essential (primary) hypertension: Secondary | ICD-10-CM | POA: Insufficient documentation

## 2015-02-08 LAB — BASIC METABOLIC PANEL
ANION GAP: 7 (ref 5–15)
BUN: 16 mg/dL (ref 6–20)
CALCIUM: 8.5 mg/dL — AB (ref 8.9–10.3)
CHLORIDE: 103 mmol/L (ref 101–111)
CO2: 27 mmol/L (ref 22–32)
CREATININE: 1.04 mg/dL — AB (ref 0.44–1.00)
GFR calc non Af Amer: 53 mL/min — ABNORMAL LOW (ref >60)
GLUCOSE: 178 mg/dL — AB (ref 65–99)
Potassium: 3.7 mmol/L (ref 3.5–5.1)
Sodium: 137 mmol/L (ref 135–145)

## 2015-02-11 ENCOUNTER — Encounter (HOSPITAL_COMMUNITY): Payer: Self-pay

## 2015-02-11 ENCOUNTER — Other Ambulatory Visit (HOSPITAL_COMMUNITY): Payer: Self-pay | Admitting: Pulmonary Disease

## 2015-02-11 ENCOUNTER — Emergency Department (HOSPITAL_COMMUNITY)
Admission: EM | Admit: 2015-02-11 | Discharge: 2015-02-11 | Disposition: A | Discharge status: Home / Self Care | Payer: Medicare Other | Attending: Emergency Medicine | Admitting: Emergency Medicine

## 2015-02-11 ENCOUNTER — Ambulatory Visit (HOSPITAL_COMMUNITY): Payer: Medicare Other

## 2015-02-11 ENCOUNTER — Inpatient Hospital Stay
Admission: RE | Admit: 2015-02-11 | Discharge: 2015-03-18 | Disposition: A | Discharge status: Other Acute Inpatient Hospital | Payer: Medicare Other | Source: Ambulatory Visit | Attending: Internal Medicine | Admitting: Internal Medicine

## 2015-02-11 ENCOUNTER — Emergency Department (HOSPITAL_COMMUNITY): Payer: Medicare Other

## 2015-02-11 DIAGNOSIS — Z7982 Long term (current) use of aspirin: Secondary | ICD-10-CM | POA: Insufficient documentation

## 2015-02-11 DIAGNOSIS — E860 Dehydration: Secondary | ICD-10-CM | POA: Insufficient documentation

## 2015-02-11 DIAGNOSIS — E119 Type 2 diabetes mellitus without complications: Secondary | ICD-10-CM | POA: Insufficient documentation

## 2015-02-11 DIAGNOSIS — Z862 Personal history of diseases of the blood and blood-forming organs and certain disorders involving the immune mechanism: Secondary | ICD-10-CM | POA: Insufficient documentation

## 2015-02-11 DIAGNOSIS — K219 Gastro-esophageal reflux disease without esophagitis: Secondary | ICD-10-CM | POA: Diagnosis not present

## 2015-02-11 DIAGNOSIS — Z8673 Personal history of transient ischemic attack (TIA), and cerebral infarction without residual deficits: Secondary | ICD-10-CM | POA: Insufficient documentation

## 2015-02-11 DIAGNOSIS — R4781 Slurred speech: Secondary | ICD-10-CM | POA: Diagnosis not present

## 2015-02-11 DIAGNOSIS — M1990 Unspecified osteoarthritis, unspecified site: Secondary | ICD-10-CM | POA: Diagnosis not present

## 2015-02-11 DIAGNOSIS — N390 Urinary tract infection, site not specified: Secondary | ICD-10-CM | POA: Diagnosis not present

## 2015-02-11 DIAGNOSIS — Z794 Long term (current) use of insulin: Secondary | ICD-10-CM | POA: Diagnosis not present

## 2015-02-11 DIAGNOSIS — R4182 Altered mental status, unspecified: Secondary | ICD-10-CM | POA: Diagnosis present

## 2015-02-11 DIAGNOSIS — I10 Essential (primary) hypertension: Secondary | ICD-10-CM | POA: Diagnosis not present

## 2015-02-11 DIAGNOSIS — G629 Polyneuropathy, unspecified: Secondary | ICD-10-CM | POA: Insufficient documentation

## 2015-02-11 LAB — CBC WITH DIFFERENTIAL/PLATELET
BASOS PCT: 0 %
Basophils Absolute: 0 10*3/uL (ref 0.0–0.1)
EOS ABS: 0.2 10*3/uL (ref 0.0–0.7)
EOS PCT: 2 %
HCT: 39.8 % (ref 36.0–46.0)
HEMOGLOBIN: 12.4 g/dL (ref 12.0–15.0)
LYMPHS ABS: 1.9 10*3/uL (ref 0.7–4.0)
Lymphocytes Relative: 18 %
MCH: 28.2 pg (ref 26.0–34.0)
MCHC: 31.2 g/dL (ref 30.0–36.0)
MCV: 90.7 fL (ref 78.0–100.0)
Monocytes Absolute: 0.7 10*3/uL (ref 0.1–1.0)
Monocytes Relative: 7 %
NEUTROS PCT: 73 %
Neutro Abs: 7.7 10*3/uL (ref 1.7–7.7)
PLATELETS: 239 10*3/uL (ref 150–400)
RBC: 4.39 MIL/uL (ref 3.87–5.11)
RDW: 13.8 % (ref 11.5–15.5)
WBC: 10.5 10*3/uL (ref 4.0–10.5)

## 2015-02-11 LAB — RAPID URINE DRUG SCREEN, HOSP PERFORMED
Amphetamines: NOT DETECTED
BARBITURATES: NOT DETECTED
BENZODIAZEPINES: NOT DETECTED
COCAINE: NOT DETECTED
Opiates: NOT DETECTED
TETRAHYDROCANNABINOL: NOT DETECTED

## 2015-02-11 LAB — COMPREHENSIVE METABOLIC PANEL
ALBUMIN: 3.2 g/dL — AB (ref 3.5–5.0)
ALK PHOS: 97 U/L (ref 38–126)
ALT: 17 U/L (ref 14–54)
ANION GAP: 7 (ref 5–15)
AST: 18 U/L (ref 15–41)
BUN: 24 mg/dL — ABNORMAL HIGH (ref 6–20)
CHLORIDE: 104 mmol/L (ref 101–111)
CO2: 26 mmol/L (ref 22–32)
CREATININE: 1.15 mg/dL — AB (ref 0.44–1.00)
Calcium: 9 mg/dL (ref 8.9–10.3)
GFR calc non Af Amer: 47 mL/min — ABNORMAL LOW (ref >60)
GFR, EST AFRICAN AMERICAN: 55 mL/min — AB (ref >60)
GLUCOSE: 216 mg/dL — AB (ref 65–99)
Potassium: 4.9 mmol/L (ref 3.5–5.1)
SODIUM: 137 mmol/L (ref 135–145)
Total Bilirubin: 0.4 mg/dL (ref 0.3–1.2)
Total Protein: 6.6 g/dL (ref 6.5–8.1)

## 2015-02-11 LAB — URINALYSIS, ROUTINE W REFLEX MICROSCOPIC
Bilirubin Urine: NEGATIVE
GLUCOSE, UA: NEGATIVE mg/dL
Hgb urine dipstick: NEGATIVE
Ketones, ur: NEGATIVE mg/dL
NITRITE: POSITIVE — AB
PH: 6 (ref 5.0–8.0)
Protein, ur: NEGATIVE mg/dL
SPECIFIC GRAVITY, URINE: 1.025 (ref 1.005–1.030)

## 2015-02-11 LAB — URINE MICROSCOPIC-ADD ON: RBC / HPF: NONE SEEN RBC/hpf (ref 0–5)

## 2015-02-11 MED ORDER — SODIUM CHLORIDE 0.9 % IV BOLUS (SEPSIS)
500.0000 mL | Freq: Once | INTRAVENOUS | Status: AC
  Administered 2015-02-11: 500 mL via INTRAVENOUS

## 2015-02-11 MED ORDER — DEXTROSE 5 % IV SOLN
1.0000 g | Freq: Once | INTRAVENOUS | Status: AC
  Administered 2015-02-11: 1 g via INTRAVENOUS
  Filled 2015-02-11: qty 10

## 2015-02-11 MED ORDER — CEPHALEXIN 500 MG PO CAPS: 500.0000 mg | ORAL_CAPSULE | Freq: Four times a day (QID) | ORAL | Status: DC

## 2015-02-11 NOTE — ED Notes (Signed)
[facility] notified that patient was ready for discharge.

## 2015-02-11 NOTE — ED Provider Notes (Addendum)
CSN: [id number]     Arrival date & time 02/11/15  1443 History   First MD Initiated Contact with Patient 02/11/15 1456     Chief Complaint  Patient presents with  . Altered Mental Status     (Consider location/radiation/quality/duration/timing/severity/associated sxs/prior Treatment) Patient is a 70 y.o. female presenting with altered mental status. The history is provided by a relative (According to the daughter the patient has been more sluggish than normal her speech is slightly slurred. Family member thinks maybe the new Celexa starting could be causing some of this).  Altered Mental Status Presenting symptoms: behavior changes   Severity:  Mild Most recent episode:  Today Episode history:  Single Timing:  Constant Progression:  Unchanged Chronicity:  Recurrent Context: alcohol use   Associated symptoms: no abdominal pain, no hallucinations, no headaches, no rash and no seizures     Past Medical History  Diagnosis Date  . Diabetes mellitus   . Hypertension   . Neuropathy ([place])   . GERD (gastroesophageal reflux disease)   . Complication of anesthesia   . PONV (postoperative nausea and vomiting)   . H/O exercise stress test     good result- 2003.  Also had Echocardiogram 10 yrs. ago, told that valve was thickening, but no need for changes , no need for f/u  . Shortness of breath   . H/O hiatal hernia   . Neuromuscular disorder ([facility])     neuropathy- legs & feet   . Arthritis     hands, hips, back   . Anemia     borderline anemia   . Cancer ([place])   . Stroke ([place])   . Gastroparesis    Past Surgical History  Procedure Laterality Date  . Knee surgery    . Abdominal hysterectomy    . Back surgery  02/2009    L3-S1 fusion  . Colonoscopy    . Upper gastrointestinal endoscopy    . Joint replacement  2005    left  . Eye surgery      cataracts removed- R eye    . Stapedectomy      bilateral, then a 2nd time to R ear  . Esophagogastroduodenoscopy N/A 02/05/2015     Procedure: ESOPHAGOGASTRODUODENOSCOPY (EGD);  Surgeon: [name], MD;  Location: [facility];  Service: Endoscopy;  Laterality: N/A;   Family History  Problem Relation Age of Onset  . Arthritis    . Cancer Brother     prostate  . Colon cancer Mother     age 34  . Breast cancer Sister   . Diabetes Brother    Social History  Substance Use Topics  . Smoking status: Never Smoker   . Smokeless tobacco: Never Used  . Alcohol Use: No   OB History    No data available     Review of Systems  Constitutional: Negative for appetite change and fatigue.  HENT: Negative for congestion, ear discharge and sinus pressure.   Eyes: Negative for discharge.  Respiratory: Negative for cough.   Cardiovascular: Negative for chest pain.  Gastrointestinal: Negative for abdominal pain and diarrhea.  Genitourinary: Negative for frequency and hematuria.  Musculoskeletal: Negative for back pain.  Skin: Negative for rash.  Neurological: Negative for seizures and headaches.  Psychiatric/Behavioral: Negative for hallucinations.      Allergies  Codeine; Hydrocodone-acetaminophen; Meperidine hcl; Contrast media; Oxycodone; and Tetracyclines & related  Home Medications   Prior to Admission medications   Medication Sig Start Date End Date Taking?  Authorizing Provider  acetaminophen (TYLENOL) 325 MG tablet Take 2 tablets (650 mg total) by mouth every 6 (six) hours as needed for mild pain, moderate pain, fever or headache. 02/06/15  Yes [name], MD  aspirin EC 81 MG tablet Take 81 mg by mouth daily.   Yes Historical Provider, MD  gabapentin (NEURONTIN) 300 MG capsule Take 1,200 mg by mouth at bedtime.  09/26/10  Yes Historical Provider, MD  insulin aspart (NOVOLOG) 100 UNIT/ML injection Inject 0-20 Units into the skin 3 (three) times daily with meals. Patient taking differently: Inject 0-20 Units into the skin 3 (three) times daily with meals. Sliding Scale: <60--Call MD 121-150= 3  units 151-200= 4 units 201-250= 7 units 251-300=11units 301-350=15units 351-400=20units >400= Call MD 02/06/15  Yes [name], MD  insulin detemir (LEVEMIR) 100 UNIT/ML injection Inject 0.25 mLs (25 Units total) into the skin at bedtime. 02/06/15  Yes [name], MD  metoCLOPramide (REGLAN) 10 MG tablet Take 1 tablet (10 mg total) by mouth 4 (four) times daily -  before meals and at bedtime. Patient taking differently: Take 5 mg by mouth 4 (four) times daily -  before meals and at bedtime.  02/06/15  Yes [name], MD  metoprolol (LOPRESSOR) 50 MG tablet Take 1 tablet (50 mg total) by mouth 2 (two) times daily. 02/06/15  Yes [name], MD  Multiple Vitamins-Minerals (CENTRUM SILVER PO) Take 1 tablet by mouth daily.   Yes Historical Provider, MD  ondansetron (ZOFRAN) 4 MG tablet Take 1 tablet (4 mg total) by mouth every 6 (six) hours as needed for nausea. 02/06/15  Yes [name], MD  ondansetron (ZOFRAN) 8 MG tablet Take 1 tablet (8 mg total) by mouth 4 (four) times daily -  before meals and at bedtime. 02/06/15  Yes [name], MD  oxyCODONE-acetaminophen (PERCOCET/ROXICET) 5-325 MG tablet Take 1 tablet by mouth every 6 (six) hours as needed. Patient taking differently: Take 1 tablet by mouth every 6 (six) hours as needed. For pain 11/30/14  Yes Milton Ferguson, MD  pantoprazole (PROTONIX) 40 MG tablet Take 1 tablet (40 mg total) by mouth 2 (two) times daily. 02/06/15  Yes [name], MD  potassium chloride SA (K-DUR,KLOR-CON) 20 MEQ tablet Take 2 tablets (40 mEq total) by mouth daily. 02/06/15  Yes [name], MD  pravastatin (PRAVACHOL) 40 MG tablet Take 40 mg by mouth at bedtime.    Yes Historical Provider, MD  verapamil (CALAN-SR) 240 MG CR tablet Take 240 mg by mouth daily with breakfast.  09/23/10  Yes Historical Provider, MD  cephALEXin (KEFLEX) 500 MG capsule Take 1 capsule (500 mg total) by mouth 4 (four) times daily. 02/11/15   Milton Ferguson, MD  citalopram  (CELEXA) 20 MG tablet Take 1 tablet (20 mg total) by mouth daily. Patient not taking: Reported on 02/11/2015 02/06/15   [name], MD  insulin aspart (NOVOLOG) 100 UNIT/ML injection Inject 0-5 Units into the skin at bedtime. Patient not taking: Reported on 02/11/2015 02/06/15   [name], MD  lidocaine (LIDODERM) 5 % Place 1 patch onto the skin daily. Remove & Discard patch within 12 hours or as directed by MD Patient not taking: Reported on 11/30/2014 08/21/13   [name], MD   BP 173/63 mmHg  Pulse 50  Resp 17  Wt 191 lb (86.637 kg)  SpO2 94% Physical Exam  Constitutional: She is oriented to person, place, and time. She appears well-developed.  HENT:  Head: Normocephalic.  Eyes: Conjunctivae and EOM are  normal. No scleral icterus.  Neck: Neck supple. No thyromegaly present.  Cardiovascular: Normal rate and regular rhythm.  Exam reveals no gallop and no friction rub.   No murmur heard. Pulmonary/Chest: No stridor. She has no wheezes. She has no rales. She exhibits no tenderness.  Abdominal: She exhibits no distension. There is no tenderness. There is no rebound.  Musculoskeletal: Normal range of motion. She exhibits no edema.  Lymphadenopathy:    She has no cervical adenopathy.  Neurological: She is oriented to person, place, and time. She exhibits normal muscle tone. Coordination normal.  Patient initially was mildly lethargic but at discharge was fully alert. Her speech according to the daughter is slower than normal.  Skin: No rash noted. No erythema.  Psychiatric: She has a normal mood and affect. Her behavior is normal.    ED Course  Procedures (including critical care time) Labs Review Labs Reviewed  COMPREHENSIVE METABOLIC PANEL - Abnormal; Notable for the following:    Glucose, Bld 216 (*)    BUN 24 (*)    Creatinine, Ser 1.15 (*)    Albumin 3.2 (*)    GFR calc non Af Amer 47 (*)    GFR calc Af Amer 55 (*)    All other components within normal limits   URINALYSIS, ROUTINE W REFLEX MICROSCOPIC (NOT AT [facility]) - Abnormal; Notable for the following:    Nitrite POSITIVE (*)    Leukocytes, UA SMALL (*)    All other components within normal limits  URINE MICROSCOPIC-ADD ON - Abnormal; Notable for the following:    Squamous Epithelial / LPF 0-5 (*)    Bacteria, UA MANY (*)    All other components within normal limits  URINE CULTURE  CBC WITH DIFFERENTIAL/PLATELET  URINE RAPID DRUG SCREEN, HOSP PERFORMED    Imaging Review Ct Head Wo Contrast  02/11/2015  CLINICAL DATA:  Headaches since 01/30/2015. EXAM: CT HEAD WITHOUT CONTRAST TECHNIQUE: Contiguous axial images were obtained from the base of the skull through the vertex without intravenous contrast. COMPARISON:  02/05/2015 FINDINGS: There is no evidence of mass effect, midline shift, or extra-axial fluid collections. There is no evidence of a space-occupying lesion or intracranial hemorrhage. There is no evidence of a cortical-based area of acute infarction. There is generalized cerebral atrophy. There is periventricular white matter low attenuation likely secondary to microangiopathy. The ventricles and sulci are appropriate for the patient's age. The basal cisterns are patent. Visualized portions of the orbits are unremarkable. The visualized portions of the paranasal sinuses and mastoid air cells are unremarkable. Cerebrovascular atherosclerotic calcifications are noted. The osseous structures are unremarkable. IMPRESSION: 1. No acute intracranial pathology. 2. Chronic microvascular disease and cerebral atrophy. Electronically Signed   By: Kathreen Devoid   On: 02/11/2015 16:07   Dg Chest Portable 1 View  02/11/2015  CLINICAL DATA:  Cough, congestion and weakness.  Initial encounter. EXAM: PORTABLE CHEST 1 VIEW COMPARISON:  Single view of the chest 01/30/2015. PA and lateral chest 07/09/2013. FINDINGS: The lungs are clear. There is cardiomegaly. No pneumothorax or pleural effusion. IMPRESSION:  Cardiomegaly without acute disease. Electronically Signed   By: Inge Rise M.D.   On: 02/11/2015 15:25   I have personally reviewed and evaluated these images and lab results as part of my medical decision-making.   EKG Interpretation   Date/Time:  Tuesday February 11 2015 14:53:37 EST Ventricular Rate:  53 PR Interval:  160 QRS Duration: 153 QT Interval:  509 QTC Calculation: 478 R Axis:   0  Text Interpretation:  Sinus rhythm Right bundle branch block Confirmed by  Browning Southwood  MD, Danyal Adorno [phone]) on 02/11/2015 7:54:49 PM      MDM   Final diagnoses:  UTI (lower urinary tract infection)   Patient CT scan that was normal,  blood work shows mild dehydration urinalysis shows urinary tract infection. We are unable to get an MRI because of her ear implant. Suspect patient is having symptoms unless Celexa and urinary tract infection. Possible new stroke but symptoms seem to be improving. She will be discharged back to the nursing home for follow-up with PCP    Milton Ferguson, MD 02/11/15 2031  Milton Ferguson, MD 02/11/15 2031

## 2015-02-11 NOTE — Discharge Instructions (Signed)
Contact dr. Luan Pulling tomorrow and let him know she is doing and that she was seen in the er

## 2015-02-11 NOTE — ED Notes (Signed)
Pt comes from [facility]. Per EMS, pt has headache since 01/30/15. Pt was scheduled a head CT here at 1600. Family facility and wanted to expedite care. Pt is alert and oriented.  Per Nursing staff at [facility] they noticed pt had worsening slurred speech, worsening left sided weakness, and decreased responsiveness at 0730 this morning.  Staff unable to give pt's LKW time.    Staff had  Been discussing treatment plan with Dr. Luan Pulling and they decided to send pt to er for evaluation.

## 2015-02-12 NOTE — H&P (Signed)
NAME:  Lori Mahoney, Lori Mahoney                      ACCOUNT NO.:  MEDICAL RECORD NO.:  [id number]  LOCATION:                                 FACILITY:  PHYSICIAN:  Taylor Levick L. Luan Pulling, M.D.DATE OF BIRTH:  1945/03/30  DATE OF ADMISSION: DATE OF DISCHARGE:  [place]                             HISTORY & PHYSICAL   Patient at the [facility].  REASON FOR ADMISSION:  Rehabilitation.  HISTORY:  This is a 70 year old, who had been in the hospital with intractable nausea and vomiting and dehydration.  She was treated and was felt to have gastroparesis causing some of her problem.  She eventually improved, but was very immobile off balance, and it was felt that she would be best served by doing rehabilitation.  This was arranged.  In addition to the previously mentioned problems, she had also been quite depressed and she was started on Celexa.  After she got to the nursing home, she became more confused, sluggish.  Plans were made for her to undergo multiple evaluations, but it appeared that her situation was more acute and she was taken to the emergency department. She had a CT scan laboratory work, and was found to have a urinary tract infection which is being treated.  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  PAST SURGICAL HISTORY:  Surgically, she has had knee surgery and abdominal hysterectomy, back surgery with an L3-S1 fusion, colonoscopy, EGD, left knee replacement, cataract removal in the right eye, stapedectomy bilaterally.  FAMILY HISTORY:  Her brother had prostate cancer.  Mother had colon cancer.  A sister had breast cancer and a brother had diabetes.  SOCIAL HISTORY:  She is a widow.  She has 2 daughters who live in town and who help provide her care.  She is a nonsmoker lifelong.  Does not use any alcohol.  REVIEW OF SYSTEMS:   Except as mentioned she has been sluggish and has had some questionably slurred speech.  Her nausea and vomiting are better.  She has no abdominal pain.  No definite symptoms of UTI except for the confusion.  ALLERGIES:  She is allergic to CODEINE, HYDROCODONE, DEMEROL, CONTRAST, OXYCODONE, TETRACYCLINE.  MEDICATIONS:  Reviewed and appear to be accurate based on the [facility] at the skilled care facility.  PHYSICAL EXAMINATION:  GENERAL:  She is awake, but she is less alert than usual. HEENT:  Her pupils are reactive.  Nose and throat are clear.  Mucous membranes are moist. NECK:  Supple without masses. CHEST:  Relatively clear. HEART:  Regular without gallop. ABDOMEN:  Soft. EXTREMITIES:  No edema. CENTRAL NERVOUS SYSTEM:  Grossly intact.  She does seem to be a little more sluggish than usual.  ASSESSMENT:  Then she had failure to thrive, difficulty with walking, intractable nausea and vomiting, and now has a urinary tract infection. She is improving and I do not think there is anything to add at this time.  She will continue her rehab efforts.     Lori Mahoney L. Luan Pulling, M.D.  ELH/MEDQ  D:  02/12/2015  T:  02/12/2015  Job:  [id number]

## 2015-02-14 LAB — URINE CULTURE

## 2015-02-15 ENCOUNTER — Telehealth (HOSPITAL_BASED_OUTPATIENT_CLINIC_OR_DEPARTMENT_OTHER): Payer: Self-pay | Admitting: Emergency Medicine

## 2015-02-15 NOTE — Telephone Encounter (Signed)
Post ED Visit - Positive Culture Follow-up  Culture report reviewed by antimicrobial stewardship pharmacist:  []  Elenor Quinones, Pharm.D. []  Heide Guile, Pharm.D., BCPS []  Parks Neptune, Pharm.D. []  Alycia Rossetti, Pharm.D., BCPS []  [address], Pharm.D., BCPS, AAHIVP []  Legrand Como, Pharm.D., BCPS, AAHIVP [x]  Milus Glazier, Pharm.D. []  Stephens November, [place].D.  Positive urine culture E. coli Treated with cephalexin, organism sensitive to the same and no further patient follow-up is required at this time.  Hazle Nordmann 02/15/2015, 11:24 AM

## 2015-02-18 ENCOUNTER — Ambulatory Visit (HOSPITAL_COMMUNITY)
Admission: RE | Admit: 2015-02-18 | Discharge: 2015-02-18 | Disposition: A | Discharge status: Home / Self Care | Payer: Medicare Other | Source: Ambulatory Visit | Attending: Pulmonary Disease | Admitting: Pulmonary Disease

## 2015-02-18 ENCOUNTER — Other Ambulatory Visit (HOSPITAL_COMMUNITY): Payer: Self-pay | Admitting: Pulmonary Disease

## 2015-02-18 ENCOUNTER — Ambulatory Visit (HOSPITAL_COMMUNITY): Payer: Medicare Other

## 2015-02-18 DIAGNOSIS — R4182 Altered mental status, unspecified: Secondary | ICD-10-CM | POA: Diagnosis not present

## 2015-02-18 DIAGNOSIS — G319 Degenerative disease of nervous system, unspecified: Secondary | ICD-10-CM | POA: Diagnosis not present

## 2015-02-18 DIAGNOSIS — R410 Disorientation, unspecified: Secondary | ICD-10-CM

## 2015-02-18 DIAGNOSIS — R4781 Slurred speech: Secondary | ICD-10-CM | POA: Diagnosis not present

## 2015-03-06 ENCOUNTER — Other Ambulatory Visit (HOSPITAL_COMMUNITY)
Admission: AD | Admit: 2015-03-06 | Discharge: 2015-03-06 | Disposition: A | Discharge status: Home / Self Care | Payer: Medicare Other | Source: Skilled Nursing Facility | Attending: Pulmonary Disease | Admitting: Pulmonary Disease

## 2015-03-06 LAB — URINE MICROSCOPIC-ADD ON

## 2015-03-06 LAB — URINALYSIS, ROUTINE W REFLEX MICROSCOPIC
Bilirubin Urine: NEGATIVE
Glucose, UA: NEGATIVE mg/dL
KETONES UR: NEGATIVE mg/dL
NITRITE: POSITIVE — AB
PH: 7 (ref 5.0–8.0)
Protein, ur: 100 mg/dL — AB
Specific Gravity, Urine: 1.015 (ref 1.005–1.030)

## 2015-03-08 LAB — URINE CULTURE

## 2015-03-18 ENCOUNTER — Inpatient Hospital Stay
Admission: RE | Admit: 2015-03-18 | Discharge: 2015-08-22 | Disposition: A | Discharge status: Nursing Home (Includes ICF) | Payer: Medicare Other | Source: Ambulatory Visit | Attending: Internal Medicine | Admitting: Internal Medicine

## 2015-03-18 ENCOUNTER — Emergency Department (HOSPITAL_COMMUNITY)
Admission: EM | Admit: 2015-03-18 | Discharge: 2015-03-18 | Disposition: A | Discharge status: Home / Self Care | Payer: Medicare Other | Attending: Emergency Medicine | Admitting: Emergency Medicine

## 2015-03-18 ENCOUNTER — Encounter (HOSPITAL_COMMUNITY): Payer: Self-pay

## 2015-03-18 ENCOUNTER — Emergency Department (HOSPITAL_COMMUNITY): Payer: Medicare Other

## 2015-03-18 DIAGNOSIS — E785 Hyperlipidemia, unspecified: Secondary | ICD-10-CM | POA: Diagnosis not present

## 2015-03-18 DIAGNOSIS — K3184 Gastroparesis: Secondary | ICD-10-CM | POA: Diagnosis not present

## 2015-03-18 DIAGNOSIS — Z8673 Personal history of transient ischemic attack (TIA), and cerebral infarction without residual deficits: Secondary | ICD-10-CM | POA: Insufficient documentation

## 2015-03-18 DIAGNOSIS — R109 Unspecified abdominal pain: Secondary | ICD-10-CM | POA: Insufficient documentation

## 2015-03-18 DIAGNOSIS — Z794 Long term (current) use of insulin: Secondary | ICD-10-CM | POA: Insufficient documentation

## 2015-03-18 DIAGNOSIS — F329 Major depressive disorder, single episode, unspecified: Secondary | ICD-10-CM | POA: Diagnosis not present

## 2015-03-18 DIAGNOSIS — Z859 Personal history of malignant neoplasm, unspecified: Secondary | ICD-10-CM | POA: Insufficient documentation

## 2015-03-18 DIAGNOSIS — M1990 Unspecified osteoarthritis, unspecified site: Secondary | ICD-10-CM | POA: Diagnosis not present

## 2015-03-18 DIAGNOSIS — R600 Localized edema: Secondary | ICD-10-CM | POA: Insufficient documentation

## 2015-03-18 DIAGNOSIS — G629 Polyneuropathy, unspecified: Secondary | ICD-10-CM | POA: Insufficient documentation

## 2015-03-18 DIAGNOSIS — Z862 Personal history of diseases of the blood and blood-forming organs and certain disorders involving the immune mechanism: Secondary | ICD-10-CM | POA: Diagnosis not present

## 2015-03-18 DIAGNOSIS — Z7982 Long term (current) use of aspirin: Secondary | ICD-10-CM | POA: Insufficient documentation

## 2015-03-18 DIAGNOSIS — E119 Type 2 diabetes mellitus without complications: Secondary | ICD-10-CM | POA: Diagnosis not present

## 2015-03-18 DIAGNOSIS — R112 Nausea with vomiting, unspecified: Secondary | ICD-10-CM | POA: Diagnosis not present

## 2015-03-18 DIAGNOSIS — I10 Essential (primary) hypertension: Secondary | ICD-10-CM | POA: Diagnosis not present

## 2015-03-18 DIAGNOSIS — Z79899 Other long term (current) drug therapy: Secondary | ICD-10-CM | POA: Insufficient documentation

## 2015-03-18 DIAGNOSIS — K219 Gastro-esophageal reflux disease without esophagitis: Secondary | ICD-10-CM | POA: Diagnosis not present

## 2015-03-18 HISTORY — DX: Difficulty in walking, not elsewhere classified: R26.2

## 2015-03-18 HISTORY — DX: Headache, unspecified: R51.9

## 2015-03-18 HISTORY — DX: Major depressive disorder, single episode, unspecified: F32.9

## 2015-03-18 HISTORY — DX: Depression, unspecified: F32.A

## 2015-03-18 HISTORY — DX: Hyperlipidemia, unspecified: E78.5

## 2015-03-18 HISTORY — DX: Headache: R51

## 2015-03-18 LAB — CBC
HCT: 41.4 % (ref 36.0–46.0)
Hemoglobin: 13.7 g/dL (ref 12.0–15.0)
MCH: 28.9 pg (ref 26.0–34.0)
MCHC: 33.1 g/dL (ref 30.0–36.0)
MCV: 87.3 fL (ref 78.0–100.0)
PLATELETS: 310 10*3/uL (ref 150–400)
RBC: 4.74 MIL/uL (ref 3.87–5.11)
RDW: 13.7 % (ref 11.5–15.5)
WBC: 12.9 10*3/uL — AB (ref 4.0–10.5)

## 2015-03-18 LAB — URINALYSIS, ROUTINE W REFLEX MICROSCOPIC
Bilirubin Urine: NEGATIVE
Glucose, UA: NEGATIVE mg/dL
HGB URINE DIPSTICK: NEGATIVE
Ketones, ur: NEGATIVE mg/dL
Leukocytes, UA: NEGATIVE
NITRITE: NEGATIVE
Specific Gravity, Urine: 1.015 (ref 1.005–1.030)
pH: 7 (ref 5.0–8.0)

## 2015-03-18 LAB — URINE MICROSCOPIC-ADD ON
RBC / HPF: NONE SEEN RBC/hpf (ref 0–5)
SQUAMOUS EPITHELIAL / LPF: NONE SEEN

## 2015-03-18 LAB — COMPREHENSIVE METABOLIC PANEL
ALK PHOS: 88 U/L (ref 38–126)
ALT: 20 U/L (ref 14–54)
ANION GAP: 12 (ref 5–15)
AST: 18 U/L (ref 15–41)
Albumin: 3.5 g/dL (ref 3.5–5.0)
BILIRUBIN TOTAL: 0.8 mg/dL (ref 0.3–1.2)
BUN: 16 mg/dL (ref 6–20)
CALCIUM: 9.4 mg/dL (ref 8.9–10.3)
CO2: 25 mmol/L (ref 22–32)
CREATININE: 0.93 mg/dL (ref 0.44–1.00)
Chloride: 100 mmol/L — ABNORMAL LOW (ref 101–111)
Glucose, Bld: 265 mg/dL — ABNORMAL HIGH (ref 65–99)
Potassium: 4 mmol/L (ref 3.5–5.1)
Sodium: 137 mmol/L (ref 135–145)
TOTAL PROTEIN: 7.7 g/dL (ref 6.5–8.1)

## 2015-03-18 LAB — CBG MONITORING, ED: GLUCOSE-CAPILLARY: 230 mg/dL — AB (ref 65–99)

## 2015-03-18 LAB — LIPASE, BLOOD: Lipase: 20 U/L (ref 11–51)

## 2015-03-18 MED ORDER — METOCLOPRAMIDE HCL 5 MG/ML IJ SOLN
10.0000 mg | Freq: Once | INTRAMUSCULAR | Status: AC
  Administered 2015-03-18: 10 mg via INTRAVENOUS
  Filled 2015-03-18: qty 2

## 2015-03-18 MED ORDER — SODIUM CHLORIDE 0.9 % IV BOLUS (SEPSIS)
500.0000 mL | Freq: Once | INTRAVENOUS | Status: AC
  Administered 2015-03-18: 500 mL via INTRAVENOUS

## 2015-03-18 MED ORDER — ONDANSETRON HCL 4 MG/2ML IJ SOLN
4.0000 mg | Freq: Once | INTRAMUSCULAR | Status: AC
  Administered 2015-03-18: 4 mg via INTRAVENOUS
  Filled 2015-03-18: qty 2

## 2015-03-18 MED ORDER — SODIUM CHLORIDE 0.9 % IV SOLN
INTRAVENOUS | Status: DC
  Administered 2015-03-18: 100 mL/h via INTRAVENOUS

## 2015-03-18 NOTE — ED Notes (Signed)
Pt reports r sided abd pain, n/v since yesterday.  Pt is a resident of [facility].  Staff reports pt also hypertensive.  Pt had zofran 8mg  po at 0615.  LBM was around 1200 yesterday and was normal per pt.  Pt also had phenergan during the night.

## 2015-03-18 NOTE — ED Notes (Signed)
Pt sleeping at this time.  No vomiting since arriving to ED.  Daughter at bedside.

## 2015-03-18 NOTE — ED Provider Notes (Signed)
CSN: [id number]     Arrival date & time 03/18/15  [id number] History  By signing my name below, I, Chere Heiken, attest that this documentation has been prepared under the direction and in the presence of Fredia Sorrow, MD. Electronically Signed: Stephania Fragmin, ED Scribe. 03/18/2015. 10:49 AM.   Chief Complaint  Patient presents with  . Abdominal Pain  . Hypertension   The history is provided by a relative and medical records (patient's daughter). The history is limited by the condition of the patient. No language interpreter was used.   LEVEL 5 CAVEAT DUE TO CONDITION OF PATIENT  HPI Comments: Lori Mahoney is a 70 y.o. female with a history of gastroparesis, DM, HTN, GERD, CVA, and cancer, who presents to the Emergency Department brought in by ambulance from the [facility], complaining of 9/10, aching right-sided abdominal pain that began yesterday. She also complains of associated nausea and vomiting that began yesterday as well; her daughter states she last vomited this morning. [facility] staff reports patient is hypertensive, per nursing notes. Her daughter states patient has been at the [facility] since being discharged from the hospital on 02/06/15 for gastroparesis. Patient has not been able to ambulate since, according to her daughter, and her neurologist is trying to rule out whether she might have had a stroke. Her daughter states they are waiting on EEG results. Patient has a history of stapedectomy, so she is unable to have an MRI. Her daughter denies any known diarrhea.   Past Medical History  Diagnosis Date  . Diabetes mellitus   . Hypertension   . Neuropathy ([place])   . GERD (gastroesophageal reflux disease)   . Complication of anesthesia   . PONV (postoperative nausea and vomiting)   . H/O exercise stress test     good result- 2003.  Also had Echocardiogram 10 yrs. ago, told that valve was thickening, but no need for changes , no need for f/u  . Shortness of breath   . H/O hiatal  hernia   . Neuromuscular disorder (HCC)     neuropathy- legs & feet   . Arthritis     hands, hips, back   . Anemia     borderline anemia   . Cancer ([place])   . Stroke ([place])   . Gastroparesis   . Depression   . Hyperlipidemia   . Difficulty walking   . Headache    Past Surgical History  Procedure Laterality Date  . Knee surgery    . Abdominal hysterectomy    . Back surgery  02/2009    L3-S1 fusion  . Colonoscopy    . Upper gastrointestinal endoscopy    . Joint replacement  2005    left  . Eye surgery      cataracts removed- R eye    . Stapedectomy      bilateral, then a 2nd time to R ear  . Esophagogastroduodenoscopy N/A 02/05/2015    Procedure: ESOPHAGOGASTRODUODENOSCOPY (EGD);  Surgeon: [name], MD;  Location: [facility];  Service: Endoscopy;  Laterality: N/A;   Family History  Problem Relation Age of Onset  . Arthritis    . Cancer Brother     prostate  . Colon cancer Mother     age 68  . Breast cancer Sister   . Diabetes Brother    Social History  Substance Use Topics  . Smoking status: Never Smoker   . Smokeless tobacco: Never Used  . Alcohol Use: No  OB History    No data available     Review of Systems  Unable to perform ROS: Acuity of condition      Allergies  Codeine; Hydrocodone-acetaminophen; Meperidine hcl; Contrast media; Oxycodone; and Tetracyclines & related  Home Medications   Prior to Admission medications   Medication Sig Start Date End Date Taking? Authorizing Provider  acetaminophen (TYLENOL) 325 MG tablet Take 2 tablets (650 mg total) by mouth every 6 (six) hours as needed for mild pain, moderate pain, fever or headache. 02/06/15  Yes [name], MD  aspirin EC 81 MG tablet Take 81 mg by mouth daily.   Yes Historical Provider, MD  buPROPion (WELLBUTRIN XL) 150 MG 24 hr tablet Take 150 mg by mouth daily.   Yes Historical Provider, MD  docusate sodium (COLACE) 100 MG capsule Take 100 mg by mouth 2 (two) times daily.   Yes  Historical Provider, MD  gabapentin (NEURONTIN) 300 MG capsule Take 1,200 mg by mouth at bedtime.  09/26/10  Yes Historical Provider, MD  insulin aspart (NOVOLOG) 100 UNIT/ML injection Inject 0-20 Units into the skin 3 (three) times daily with meals. Patient taking differently: Inject 0-20 Units into the skin 3 (three) times daily with meals. Sliding Scale: <60--Call MD 121-150= 3 units 151-200= 4 units 201-250= 7 units 251-300=11units 301-350=15units 351-400=20units >400= Call MD 02/06/15  Yes [name], MD  insulin detemir (LEVEMIR) 100 UNIT/ML injection Inject 0.25 mLs (25 Units total) into the skin at bedtime. 02/06/15  Yes [name], MD  metoprolol (LOPRESSOR) 50 MG tablet Take 1 tablet (50 mg total) by mouth 2 (two) times daily. 02/06/15  Yes [name], MD  Multiple Vitamins-Minerals (CENTRUM SILVER PO) Take 1 tablet by mouth daily.   Yes Historical Provider, MD  ondansetron (ZOFRAN) 4 MG tablet Take 1 tablet (4 mg total) by mouth every 6 (six) hours as needed for nausea. 02/06/15  Yes [name], MD  ondansetron (ZOFRAN) 8 MG tablet Take 1 tablet (8 mg total) by mouth 4 (four) times daily -  before meals and at bedtime. 02/06/15  Yes [name], MD  pantoprazole (PROTONIX) 40 MG tablet Take 1 tablet (40 mg total) by mouth 2 (two) times daily. 02/06/15  Yes [name], MD  potassium chloride SA (K-DUR,KLOR-CON) 20 MEQ tablet Take 2 tablets (40 mEq total) by mouth daily. 02/06/15  Yes [name], MD  pravastatin (PRAVACHOL) 40 MG tablet Take 40 mg by mouth at bedtime.    Yes Historical Provider, MD  verapamil (CALAN-SR) 240 MG CR tablet Take 240 mg by mouth daily with breakfast.  09/23/10  Yes Historical Provider, MD  cephALEXin (KEFLEX) 500 MG capsule Take 1 capsule (500 mg total) by mouth 4 (four) times daily. Patient not taking: Reported on 03/18/2015 02/11/15   Milton Ferguson, MD  citalopram (CELEXA) 20 MG tablet Take 1 tablet (20 mg total) by mouth daily. Patient  not taking: Reported on 02/11/2015 02/06/15   [name], MD  lidocaine (LIDODERM) 5 % Place 1 patch onto the skin daily. Remove & Discard patch within 12 hours or as directed by MD Patient not taking: Reported on 11/30/2014 08/21/13   [name], MD  metoCLOPramide (REGLAN) 10 MG tablet Take 1 tablet (10 mg total) by mouth 4 (four) times daily -  before meals and at bedtime. Patient not taking: Reported on 03/18/2015 02/06/15   [name], MD  oxyCODONE-acetaminophen (PERCOCET/ROXICET) 5-325 MG tablet Take 1 tablet by mouth every 6 (six) hours as needed. Patient  not taking: Reported on 03/18/2015 11/30/14   Milton Ferguson, MD   BP 149/92 mmHg  Pulse 87  Temp(Src) 97.6 F (36.4 C) (Oral)  Resp 21  Ht 5\' 3"  (1.6 m)  Wt 84.369 kg  BMI 32.96 kg/m2  SpO2 96% Physical Exam  Constitutional: She is oriented to person, place, and time. She appears well-developed and well-nourished. No distress.  HENT:  Head: Normocephalic and atraumatic.  Mucous membranes are dry.   Eyes: Conjunctivae and EOM are normal. Pupils are equal, round, and reactive to light.  Pupils are normal. Sclera is clear. Eyes track normal.   Neck: Neck supple. No tracheal deviation present.  Cardiovascular: Normal rate, regular rhythm and normal heart sounds.   Pulmonary/Chest: Effort normal and breath sounds normal. No respiratory distress. She has no wheezes. She has no rales.  Lungs are clear to auscultation bilaterally.   Abdominal: Soft. Bowel sounds are normal. She exhibits no distension. There is no tenderness.  Abdomen is nontender to palpation.  Musculoskeletal: Normal range of motion. She exhibits edema.  Trace edema in BLE.  Neurological: She is alert and oriented to person, place, and time.  Skin: Skin is warm and dry.  Psychiatric: She has a normal mood and affect. Her behavior is normal.  Nursing note and vitals reviewed.   ED Course  Procedures (including critical care time)  DIAGNOSTIC  STUDIES: Oxygen Saturation is 96% on RA, normal by my interpretation.    COORDINATION OF CARE: 9:13 AM - Discussed treatment plan with pt's daughter at bedside which includes diagnostic testing. Pt's daughter verbalized understanding and agreed to plan.   Labs Review Labs Reviewed  COMPREHENSIVE METABOLIC PANEL - Abnormal; Notable for the following:    Chloride 100 (*)    Glucose, Bld 265 (*)    All other components within normal limits  CBC - Abnormal; Notable for the following:    WBC 12.9 (*)    All other components within normal limits  URINALYSIS, ROUTINE W REFLEX MICROSCOPIC (NOT AT [facility]) - Abnormal; Notable for the following:    Protein, ur TRACE (*)    All other components within normal limits  URINE MICROSCOPIC-ADD ON - Abnormal; Notable for the following:    Bacteria, UA MANY (*)    All other components within normal limits  CBG MONITORING, ED - Abnormal; Notable for the following:    Glucose-Capillary 230 (*)    All other components within normal limits  URINE CULTURE  LIPASE, BLOOD   Results for orders placed or performed during the hospital encounter of 03/18/15  Lipase, blood  Result Value Ref Range   Lipase 20 11 - 51 U/L  Comprehensive metabolic panel  Result Value Ref Range   Sodium 137 135 - 145 mmol/L   Potassium 4.0 3.5 - 5.1 mmol/L   Chloride 100 (L) 101 - 111 mmol/L   CO2 25 22 - 32 mmol/L   Glucose, Bld 265 (H) 65 - 99 mg/dL   BUN 16 6 - 20 mg/dL   Creatinine, Ser 0.93 0.44 - 1.00 mg/dL   Calcium 9.4 8.9 - 10.3 mg/dL   Total Protein 7.7 6.5 - 8.1 g/dL   Albumin 3.5 3.5 - 5.0 g/dL   AST 18 15 - 41 U/L   ALT 20 14 - 54 U/L   Alkaline Phosphatase 88 38 - 126 U/L   Total Bilirubin 0.8 0.3 - 1.2 mg/dL   GFR calc non Af Amer >60 >60 mL/min   GFR calc Af Amer >60 >  60 mL/min   Anion gap 12 5 - 15  CBC  Result Value Ref Range   WBC 12.9 (H) 4.0 - 10.5 K/uL   RBC 4.74 3.87 - 5.11 MIL/uL   Hemoglobin 13.7 12.0 - 15.0 g/dL   HCT 41.4 36.0 - 46.0 %    MCV 87.3 78.0 - 100.0 fL   MCH 28.9 26.0 - 34.0 pg   MCHC 33.1 30.0 - 36.0 g/dL   RDW 13.7 11.5 - 15.5 %   Platelets 310 150 - 400 K/uL  Urinalysis, Routine w reflex microscopic (not at [facility])  Result Value Ref Range   Color, Urine YELLOW YELLOW   APPearance CLEAR CLEAR   Specific Gravity, Urine 1.015 1.005 - 1.030   pH 7.0 5.0 - 8.0   Glucose, UA NEGATIVE NEGATIVE mg/dL   Hgb urine dipstick NEGATIVE NEGATIVE   Bilirubin Urine NEGATIVE NEGATIVE   Ketones, ur NEGATIVE NEGATIVE mg/dL   Protein, ur TRACE (A) NEGATIVE mg/dL   Nitrite NEGATIVE NEGATIVE   Leukocytes, UA NEGATIVE NEGATIVE  Urine microscopic-add on  Result Value Ref Range   Squamous Epithelial / LPF NONE SEEN NONE SEEN   WBC, UA 0-5 0 - 5 WBC/hpf   RBC / HPF NONE SEEN 0 - 5 RBC/hpf   Bacteria, UA MANY (A) NONE SEEN  POC CBG, ED  Result Value Ref Range   Glucose-Capillary 230 (H) 65 - 99 mg/dL     Imaging Review Dg Chest Port 1 View  03/18/2015  CLINICAL DATA:  Right-sided abdominal pain.  Shortness of breath. EXAM: PORTABLE CHEST 1 VIEW COMPARISON:  02/11/2015 . FINDINGS: Mediastinum hilar structures normal. Cardiomegaly with normal pulmonary vascularity. Low lung volumes. No pleural effusion or pneumothorax. No acute bony abnormality IMPRESSION: Cardiomegaly.  Low lung volumes with mild basilar atelectasis. Electronically Signed   By: Marcello Moores  Register   On: 03/18/2015 09:34   I have personally reviewed and evaluated these images and lab results as part of my medical decision-making.   EKG Interpretation   Date/Time:  Tuesday March 18 2015 08:40:28 EST Ventricular Rate:  78 PR Interval:  159 QRS Duration: 147 QT Interval:  463 QTC Calculation: 527 R Axis:   21 Text Interpretation:  Sinus rhythm Right bundle branch block Baseline  wander in lead(s) V2 Confirmed by Kairie Vangieson  MD, Cuyler Vandyken ([id number]) on  03/18/2015 9:10:09 AM      MDM   Final diagnoses:  Non-intractable vomiting with nausea, vomiting of  unspecified type   Range family members patient with onset of nausea and vomiting yesterday. Sent over from [facility]. Patient with the evidence of some mild mild hypertension. Patient was given Zofran there without any particular help. Patient treated here with Reglan and Zofran vomiting controlled. Patient clinically appeared dehydrated. Labs without evidence of significant dehydration. Patient did have mild elevation in her blood sugar to 65 no evidence of diabetic ketoacidosis. No evidence of urinary tract infection. Chest x-ray also negative for pneumonia or any acute findings.  Patient improved with fluid hydration here patient stable for discharge back to [facility].  I personally performed the services described in this documentation, which was scribed in my presence. The recorded information has been reviewed and is accurate.       Fredia Sorrow, MD 03/18/15 1243

## 2015-03-18 NOTE — ED Notes (Signed)
Report given to [facility] and d/c instructions given to daughters.

## 2015-03-18 NOTE — ED Notes (Signed)
Given po fluids and pt tolerated well at this time.

## 2015-03-18 NOTE — Discharge Instructions (Signed)
The patient's nausea and vomiting controlled here with Zofran and Reglan. Patient clinically appeared dry but labs without evidence of significant dehydration no evidence of any diabetic ketoacidosis. No evidence urinary tract infection. Check x-ray without evidence of any infection.  Patient stable for discharge back to [facility].

## 2015-03-20 LAB — URINE CULTURE: Culture: >=100000

## 2015-03-21 ENCOUNTER — Telehealth (HOSPITAL_COMMUNITY): Payer: Self-pay

## 2015-03-21 NOTE — Progress Notes (Signed)
ED Antimicrobial Stewardship Positive Culture Follow Up   Lori Mahoney is an 70 y.o. female who presented to [facility] on 03/18/2015 with a chief complaint of  Chief Complaint  Patient presents with  . Abdominal Pain  . Hypertension    Recent Results (from the past 720 hour(s))  Culture, Urine     Status: None   Collection Time: 03/06/15  3:00 PM  Result Value Ref Range Status   Specimen Description URINE, CATHETERIZED  Final   Special Requests NONE  Final   Culture   Final    >=100,000 COLONIES/mL ESCHERICHIA COLI Performed at [facility]    Report Status 03/08/2015 FINAL  Final   Organism ID, Bacteria ESCHERICHIA COLI  Final      Susceptibility   Escherichia coli - MIC*    AMPICILLIN >=32 RESISTANT Resistant     CEFAZOLIN <=4 SENSITIVE Sensitive     CEFTRIAXONE <=1 SENSITIVE Sensitive     CIPROFLOXACIN <=0.25 SENSITIVE Sensitive     GENTAMICIN <=1 SENSITIVE Sensitive     IMIPENEM <=0.25 SENSITIVE Sensitive     NITROFURANTOIN <=16 SENSITIVE Sensitive     TRIMETH/SULFA <=20 SENSITIVE Sensitive     AMPICILLIN/SULBACTAM 16 INTERMEDIATE Intermediate     PIP/TAZO <=4 SENSITIVE Sensitive     * >=100,000 COLONIES/mL ESCHERICHIA COLI  Urine culture     Status: None   Collection Time: 03/18/15 10:25 AM  Result Value Ref Range Status   Specimen Description URINE, CATHETERIZED  Final   Special Requests NONE  Final   Culture   Final    >=100,000 COLONIES/mL ESCHERICHIA COLI Performed at [facility]    Report Status 03/20/2015 FINAL  Final   Organism ID, Bacteria ESCHERICHIA COLI  Final      Susceptibility   Escherichia coli - MIC*    AMPICILLIN >=32 RESISTANT Resistant     CEFAZOLIN <=4 SENSITIVE Sensitive     CEFTRIAXONE <=1 SENSITIVE Sensitive     CIPROFLOXACIN <=0.25 SENSITIVE Sensitive     GENTAMICIN <=1 SENSITIVE Sensitive     IMIPENEM <=0.25 SENSITIVE Sensitive     NITROFURANTOIN <=16 SENSITIVE Sensitive     TRIMETH/SULFA <=20 SENSITIVE Sensitive     AMPICILLIN/SULBACTAM 16 INTERMEDIATE Intermediate     PIP/TAZO <=4 SENSITIVE Sensitive     * >=100,000 COLONIES/mL ESCHERICHIA COLI    [x]  Patient discharged originally without antimicrobial agent and treatment is now indicated  Asymptomatic bacteruria  Plan: No treatment neccessary  ED Provider: Tona Sensing 03/21/2015, 9:06 AM Infectious Diseases Pharmacist Phone# [phone]

## 2015-03-21 NOTE — Telephone Encounter (Signed)
Post ED Visit - Positive Culture Follow-up  Culture report reviewed by antimicrobial stewardship pharmacist:  [x]  Elenor Quinones, Pharm.D. []  Heide Guile, Pharm.D., BCPS []  Parks Neptune, Pharm.D. []  Alycia Rossetti, Pharm.D., BCPS []  Carey, Pharm.D., BCPS, AAHIVP []  Legrand Como, Pharm.D., BCPS, AAHIVP []  Milus Glazier, Pharm.D. []  Stephens November, Pharm.D.  Positive urine culture, >/= 100,000 colonies -> E Coli Chart reviewed by K. Rose PA "No treatment necessary"  Dortha Kern 03/21/2015, 12:08 PM

## 2015-03-24 NOTE — Progress Notes (Signed)
NAME:  Martinique, Dorien                 ACCOUNT NO.:  [id number]  MEDICAL RECORD NO.:  [id number]  LOCATION:  APA09                         FACILITY:  [facility]  PHYSICIAN:  Nyxon Strupp L. Luan Pulling, M.D.DATE OF BIRTH:  20-Nov-1945  DATE OF PROCEDURE: DATE OF DISCHARGE:  03/18/2015                                PROGRESS NOTE   SUBJECTIVE:  Ms. Martinique has been having more problems with nausea and vomiting.  She is known to have gastroparesis.  She was taken off Reglan because of concerns that it was causing CNS effects.  However, she is having so much nausea and vomiting that I think we are going to need to restart that.  OBJECTIVE:  GENERAL:  She is mildly confused. CHEST:  Clear. ABDOMEN:  Soft, without masses. EXTREMITIES:  No edema.  ASSESSMENT:  She has gastroparesis and is having more trouble with nausea and vomiting, and I think we need to get her back on the Reglan. So I am going to restart that and she will need to have frequent checks of her mental status.     Lenny Bouchillon L. Luan Pulling, M.D.     ELH/MEDQ  D:  03/24/2015  T:  03/24/2015  Job:  [id number]

## 2015-04-07 NOTE — Progress Notes (Signed)
She says she feels better. She had been taken off Reglan because there was concern that it was giving her parkinsonian symptoms. However she had a lot more trouble with her gastroparesis after that and started again with intractable nausea and vomiting. She was placed back on Reglan and has improved. This is documentation of my visit at the skilled care facility on 04/05/2015. She is still mildly confused which is chronic now.  She is awake mildly confused. Her chest is clear. Her heart is regular. Abdomen is soft.  She has multiple medical problems. She has had a stroke which has left her with difficulty balancing. She has diabetes and that's doing better. She has diabetic gastroparesis and she's doing much better now that she is back on Reglan. I think we may have to accept some parkinsonian symptoms.  Continue Reglan. Continue other treatments. Continue PT etc.

## 2015-04-20 ENCOUNTER — Other Ambulatory Visit (HOSPITAL_COMMUNITY)
Admission: RE | Admit: 2015-04-20 | Discharge: 2015-04-20 | Disposition: A | Discharge status: Home / Self Care | Payer: Medicare Other | Source: Skilled Nursing Facility | Attending: Pulmonary Disease | Admitting: Pulmonary Disease

## 2015-04-20 DIAGNOSIS — R278 Other lack of coordination: Secondary | ICD-10-CM | POA: Diagnosis present

## 2015-04-20 LAB — URINALYSIS, ROUTINE W REFLEX MICROSCOPIC
Glucose, UA: NEGATIVE mg/dL
KETONES UR: 15 mg/dL — AB
Nitrite: NEGATIVE
PH: 6 (ref 5.0–8.0)
PROTEIN: 30 mg/dL — AB
Specific Gravity, Urine: 1.025 (ref 1.005–1.030)

## 2015-04-20 LAB — URINE MICROSCOPIC-ADD ON

## 2015-04-22 ENCOUNTER — Encounter (HOSPITAL_COMMUNITY)
Admission: AD | Admit: 2015-04-22 | Discharge: 2015-04-22 | Disposition: A | Discharge status: Home / Self Care | Payer: Medicare Other | Source: Skilled Nursing Facility | Attending: Pulmonary Disease | Admitting: Pulmonary Disease

## 2015-04-22 DIAGNOSIS — I10 Essential (primary) hypertension: Secondary | ICD-10-CM | POA: Diagnosis present

## 2015-04-22 DIAGNOSIS — N390 Urinary tract infection, site not specified: Secondary | ICD-10-CM | POA: Diagnosis present

## 2015-04-22 DIAGNOSIS — E1143 Type 2 diabetes mellitus with diabetic autonomic (poly)neuropathy: Secondary | ICD-10-CM | POA: Insufficient documentation

## 2015-04-22 LAB — URINE CULTURE: Culture: >=100000

## 2015-04-22 LAB — URINALYSIS, ROUTINE W REFLEX MICROSCOPIC
BILIRUBIN URINE: NEGATIVE
Glucose, UA: NEGATIVE mg/dL
Hgb urine dipstick: NEGATIVE
KETONES UR: NEGATIVE mg/dL
LEUKOCYTES UA: NEGATIVE
NITRITE: NEGATIVE
PH: 8 (ref 5.0–8.0)
Protein, ur: NEGATIVE mg/dL
SPECIFIC GRAVITY, URINE: 1.005 (ref 1.005–1.030)

## 2015-04-24 LAB — URINE CULTURE: Culture: >=100000

## 2015-05-04 NOTE — Progress Notes (Signed)
This is documentation of my visit of 04/26/2015 at the skilled care facility. I found her in one of the common rooms sitting up. She says she feels better as far as her nausea is concerned. She is concerned because she's not really able to ambulate well.  She is awake and alert. She is somewhat hard of hearing. Her chest is clear. Her heart is regular.  She has chronic nausea which I think is related to diabetic gastroparesis. She has diabetes which is pretty well controlled. She is deconditioned and was not making any progress so PT has been stopped but I think we might be able to restart. She had a previous stroke. She had parkinsonian symptoms from Reglan but she is unable to tolerate the nausea when she is not on Reglan so I started that back.  See if we can restart physical therapy

## 2015-06-02 NOTE — Progress Notes (Signed)
This is documentation of visit at the skilled care facility of 05/31/2015. She is overall about the same. She still has some trouble with nausea but it is better. She says she is still very weak and she wants to see if she can start back on physical therapy.  She is awake and alert. Her chest is clear. She still looks weak. Her abdomen is soft. She does not have any edema.  She had a stroke in the remote past. She has gastroparesis and chronic nausea and vomiting from that. She is very weak and that she needs to restart PT if she can do that

## 2015-07-04 NOTE — Progress Notes (Signed)
This is documentation of my visit of 07/03/2015 at the skilled care facility. She generally looks better. She is awake and alert. She did try physical therapy again and it was not successful in getting her to be able to walk. Her nausea is significantly better  She is awake and alert. She is sitting up. She looks more cheerful. Her chest is clear. Her heart is regular.  She has had multiple problems including diabetic gastroparesis. She had some problems with mental status related to the use of Reglan so she went off of that but her gastroparesis became so much worse that after discussion with her family she is back on Reglan and does not seem to be having a mental status issues. She has not been able to walk. She is severely deconditioned. She has had a previous stroke. She has diabetes which is been pretty well controlled.  Continue current treatment. She does look much better today.

## 2015-07-30 ENCOUNTER — Other Ambulatory Visit (HOSPITAL_COMMUNITY)
Admission: AD | Admit: 2015-07-30 | Discharge: 2015-07-30 | Disposition: A | Discharge status: Home / Self Care | Payer: Medicare Other | Source: Skilled Nursing Facility | Attending: Pulmonary Disease | Admitting: Pulmonary Disease

## 2015-07-30 DIAGNOSIS — E1143 Type 2 diabetes mellitus with diabetic autonomic (poly)neuropathy: Secondary | ICD-10-CM | POA: Insufficient documentation

## 2015-07-30 DIAGNOSIS — M6281 Muscle weakness (generalized): Secondary | ICD-10-CM | POA: Diagnosis not present

## 2015-07-30 DIAGNOSIS — I10 Essential (primary) hypertension: Secondary | ICD-10-CM | POA: Insufficient documentation

## 2015-07-30 DIAGNOSIS — R278 Other lack of coordination: Secondary | ICD-10-CM | POA: Diagnosis not present

## 2015-07-30 DIAGNOSIS — F339 Major depressive disorder, recurrent, unspecified: Secondary | ICD-10-CM | POA: Diagnosis not present

## 2015-07-30 LAB — VITAMIN B12: Vitamin B-12: 343 pg/mL (ref 180–914)

## 2015-07-30 LAB — TSH: TSH: 2.035 u[IU]/mL (ref 0.350–4.500)

## 2015-07-31 LAB — VITAMIN D 25 HYDROXY (VIT D DEFICIENCY, FRACTURES): Vit D, 25-Hydroxy: 27.1 ng/mL — ABNORMAL LOW (ref 30.0–100.0)

## 2015-08-02 NOTE — Progress Notes (Signed)
This is documentation of my visit of 08/01/15 at the skilled care facility. She still has nausea but is better. She is still very weak. She says she can take 1 or 2 steps. She has no other new complaints.  Vital signs are as recorded. Her speech is slurred as always. She seems mildly confused. Her chest is clear. Her heart is regular. Her abdomen is soft without masses.  She has intractable nausea and vomiting at least partially from diabetic gastroparesis. She has diabetes which is doing better since she's been in a controlled environment. She had a remote stroke and still has problems from that.  Continue current treatments. Overall she's better.

## 2015-08-22 ENCOUNTER — Encounter (HOSPITAL_COMMUNITY): Payer: Self-pay

## 2015-08-22 ENCOUNTER — Emergency Department (HOSPITAL_COMMUNITY): Payer: Medicare Other

## 2015-08-22 ENCOUNTER — Inpatient Hospital Stay (HOSPITAL_COMMUNITY)
Admission: EM | Admit: 2015-08-22 | Discharge: 2015-08-27 | DRG: 064 | Disposition: A | Discharge status: Skilled Nursing Facility | Payer: Medicare Other | Attending: Pulmonary Disease | Admitting: Pulmonary Disease

## 2015-08-22 DIAGNOSIS — I69354 Hemiplegia and hemiparesis following cerebral infarction affecting left non-dominant side: Secondary | ICD-10-CM | POA: Diagnosis not present

## 2015-08-22 DIAGNOSIS — G9341 Metabolic encephalopathy: Secondary | ICD-10-CM | POA: Diagnosis present

## 2015-08-22 DIAGNOSIS — I63239 Cerebral infarction due to unspecified occlusion or stenosis of unspecified carotid arteries: Secondary | ICD-10-CM | POA: Diagnosis not present

## 2015-08-22 DIAGNOSIS — Z833 Family history of diabetes mellitus: Secondary | ICD-10-CM | POA: Diagnosis not present

## 2015-08-22 DIAGNOSIS — Z66 Do not resuscitate: Secondary | ICD-10-CM | POA: Diagnosis present

## 2015-08-22 DIAGNOSIS — Z96653 Presence of artificial knee joint, bilateral: Secondary | ICD-10-CM | POA: Diagnosis present

## 2015-08-22 DIAGNOSIS — K219 Gastro-esophageal reflux disease without esophagitis: Secondary | ICD-10-CM | POA: Diagnosis present

## 2015-08-22 DIAGNOSIS — E1140 Type 2 diabetes mellitus with diabetic neuropathy, unspecified: Secondary | ICD-10-CM | POA: Diagnosis present

## 2015-08-22 DIAGNOSIS — E1143 Type 2 diabetes mellitus with diabetic autonomic (poly)neuropathy: Secondary | ICD-10-CM | POA: Diagnosis present

## 2015-08-22 DIAGNOSIS — Z794 Long term (current) use of insulin: Secondary | ICD-10-CM

## 2015-08-22 DIAGNOSIS — Z515 Encounter for palliative care: Secondary | ICD-10-CM

## 2015-08-22 DIAGNOSIS — Z7189 Other specified counseling: Secondary | ICD-10-CM

## 2015-08-22 DIAGNOSIS — E860 Dehydration: Secondary | ICD-10-CM | POA: Diagnosis present

## 2015-08-22 DIAGNOSIS — E785 Hyperlipidemia, unspecified: Secondary | ICD-10-CM | POA: Diagnosis present

## 2015-08-22 DIAGNOSIS — M436 Torticollis: Secondary | ICD-10-CM | POA: Diagnosis present

## 2015-08-22 DIAGNOSIS — K3184 Gastroparesis: Secondary | ICD-10-CM | POA: Diagnosis present

## 2015-08-22 DIAGNOSIS — Z800 Family history of malignant neoplasm of digestive organs: Secondary | ICD-10-CM | POA: Diagnosis not present

## 2015-08-22 DIAGNOSIS — G2119 Other drug induced secondary parkinsonism: Secondary | ICD-10-CM | POA: Diagnosis present

## 2015-08-22 DIAGNOSIS — M1990 Unspecified osteoarthritis, unspecified site: Secondary | ICD-10-CM | POA: Diagnosis present

## 2015-08-22 DIAGNOSIS — N3000 Acute cystitis without hematuria: Secondary | ICD-10-CM

## 2015-08-22 DIAGNOSIS — N179 Acute kidney failure, unspecified: Secondary | ICD-10-CM | POA: Diagnosis present

## 2015-08-22 DIAGNOSIS — M6289 Other specified disorders of muscle: Secondary | ICD-10-CM

## 2015-08-22 DIAGNOSIS — F329 Major depressive disorder, single episode, unspecified: Secondary | ICD-10-CM | POA: Diagnosis present

## 2015-08-22 DIAGNOSIS — I10 Essential (primary) hypertension: Secondary | ICD-10-CM | POA: Diagnosis present

## 2015-08-22 DIAGNOSIS — I63521 Cerebral infarction due to unspecified occlusion or stenosis of right anterior cerebral artery: Secondary | ICD-10-CM

## 2015-08-22 DIAGNOSIS — I639 Cerebral infarction, unspecified: Secondary | ICD-10-CM | POA: Diagnosis not present

## 2015-08-22 DIAGNOSIS — R531 Weakness: Secondary | ICD-10-CM | POA: Diagnosis present

## 2015-08-22 DIAGNOSIS — N390 Urinary tract infection, site not specified: Secondary | ICD-10-CM | POA: Diagnosis present

## 2015-08-22 DIAGNOSIS — Z803 Family history of malignant neoplasm of breast: Secondary | ICD-10-CM

## 2015-08-22 DIAGNOSIS — Z87440 Personal history of urinary (tract) infections: Secondary | ICD-10-CM

## 2015-08-22 DIAGNOSIS — I6330 Cerebral infarction due to thrombosis of unspecified cerebral artery: Secondary | ICD-10-CM

## 2015-08-22 DIAGNOSIS — E876 Hypokalemia: Secondary | ICD-10-CM | POA: Diagnosis present

## 2015-08-22 DIAGNOSIS — I6789 Other cerebrovascular disease: Secondary | ICD-10-CM | POA: Diagnosis not present

## 2015-08-22 HISTORY — DX: Other intervertebral disc degeneration, lumbar region without mention of lumbar back pain or lower extremity pain: M51.369

## 2015-08-22 HISTORY — DX: Other intervertebral disc degeneration, lumbar region: M51.36

## 2015-08-22 HISTORY — DX: Spinal stenosis, site unspecified: M48.00

## 2015-08-22 HISTORY — DX: Urinary tract infection, site not specified: N39.0

## 2015-08-22 LAB — COMPREHENSIVE METABOLIC PANEL
ALK PHOS: 78 U/L (ref 38–126)
ALT: 25 U/L (ref 14–54)
ANION GAP: 7 (ref 5–15)
AST: 24 U/L (ref 15–41)
Albumin: 3.2 g/dL — ABNORMAL LOW (ref 3.5–5.0)
BILIRUBIN TOTAL: 0.4 mg/dL (ref 0.3–1.2)
BUN: 42 mg/dL — ABNORMAL HIGH (ref 6–20)
CALCIUM: 9.1 mg/dL (ref 8.9–10.3)
CO2: 24 mmol/L (ref 22–32)
CREATININE: 2.53 mg/dL — AB (ref 0.44–1.00)
Chloride: 105 mmol/L (ref 101–111)
GFR, EST AFRICAN AMERICAN: 21 mL/min — AB (ref >60)
GFR, EST NON AFRICAN AMERICAN: 18 mL/min — AB (ref >60)
Glucose, Bld: 127 mg/dL — ABNORMAL HIGH (ref 65–99)
Potassium: 4.1 mmol/L (ref 3.5–5.1)
SODIUM: 136 mmol/L (ref 135–145)
TOTAL PROTEIN: 6.7 g/dL (ref 6.5–8.1)

## 2015-08-22 LAB — I-STAT TROPONIN, ED: Troponin i, poc: 0 ng/mL (ref 0.00–0.08)

## 2015-08-22 LAB — CBC
HCT: 36.4 % (ref 36.0–46.0)
Hemoglobin: 12.1 g/dL (ref 12.0–15.0)
MCH: 29.9 pg (ref 26.0–34.0)
MCHC: 33.2 g/dL (ref 30.0–36.0)
MCV: 89.9 fL (ref 78.0–100.0)
Platelets: 179 10*3/uL (ref 150–400)
RBC: 4.05 MIL/uL (ref 3.87–5.11)
RDW: 13.7 % (ref 11.5–15.5)
WBC: 21.5 10*3/uL — AB (ref 4.0–10.5)

## 2015-08-22 LAB — PROTIME-INR
INR: 1.1
PROTHROMBIN TIME: 14.2 s (ref 11.4–15.2)

## 2015-08-22 LAB — URINALYSIS, ROUTINE W REFLEX MICROSCOPIC
BILIRUBIN URINE: NEGATIVE
Glucose, UA: NEGATIVE mg/dL
Hgb urine dipstick: NEGATIVE
KETONES UR: NEGATIVE mg/dL
NITRITE: NEGATIVE
Protein, ur: NEGATIVE mg/dL
pH: 5 (ref 5.0–8.0)

## 2015-08-22 LAB — I-STAT CHEM 8, ED
BUN: 38 mg/dL — AB (ref 6–20)
CALCIUM ION: 1.26 mmol/L — AB (ref 1.12–1.23)
CHLORIDE: 101 mmol/L (ref 101–111)
CREATININE: 2.6 mg/dL — AB (ref 0.44–1.00)
Glucose, Bld: 123 mg/dL — ABNORMAL HIGH (ref 65–99)
HCT: 36 % (ref 36.0–46.0)
Hemoglobin: 12.2 g/dL (ref 12.0–15.0)
Potassium: 3.9 mmol/L (ref 3.5–5.1)
SODIUM: 138 mmol/L (ref 135–145)
TCO2: 25 mmol/L (ref 0–100)

## 2015-08-22 LAB — DIFFERENTIAL
Basophils Absolute: 0 10*3/uL (ref 0.0–0.1)
Basophils Relative: 0 %
EOS PCT: 1 %
Eosinophils Absolute: 0.1 10*3/uL (ref 0.0–0.7)
LYMPHS ABS: 2.1 10*3/uL (ref 0.7–4.0)
LYMPHS PCT: 10 %
MONO ABS: 1.1 10*3/uL — AB (ref 0.1–1.0)
MONOS PCT: 5 %
NEUTROS ABS: 18.2 10*3/uL — AB (ref 1.7–7.7)
Neutrophils Relative %: 85 %

## 2015-08-22 LAB — APTT: aPTT: 35 seconds (ref 24–36)

## 2015-08-22 LAB — URINE MICROSCOPIC-ADD ON: RBC / HPF: NONE SEEN RBC/hpf (ref 0–5)

## 2015-08-22 LAB — GLUCOSE, CAPILLARY
GLUCOSE-CAPILLARY: 154 mg/dL — AB (ref 65–99)
Glucose-Capillary: 126 mg/dL — ABNORMAL HIGH (ref 65–99)

## 2015-08-22 LAB — CBG MONITORING, ED: Glucose-Capillary: 128 mg/dL — ABNORMAL HIGH (ref 65–99)

## 2015-08-22 LAB — RAPID URINE DRUG SCREEN, HOSP PERFORMED
AMPHETAMINES: NOT DETECTED
Barbiturates: NOT DETECTED
Benzodiazepines: NOT DETECTED
Cocaine: NOT DETECTED
Opiates: NOT DETECTED
Tetrahydrocannabinol: NOT DETECTED

## 2015-08-22 LAB — ETHANOL: Alcohol, Ethyl (B): <5 mg/dL (ref <5)

## 2015-08-22 MED ORDER — VITAMIN D 1000 UNITS PO TABS
1000.0000 [IU] | ORAL_TABLET | Freq: Every day | ORAL | Status: DC
  Administered 2015-08-22 – 2015-08-27 (×6): 1000 [IU] via ORAL
  Filled 2015-08-22 (×6): qty 1

## 2015-08-22 MED ORDER — ENOXAPARIN SODIUM 30 MG/0.3ML ~~LOC~~ SOLN
30.0000 mg | SUBCUTANEOUS | Status: DC
  Administered 2015-08-22: 30 mg via SUBCUTANEOUS
  Filled 2015-08-22: qty 0.3

## 2015-08-22 MED ORDER — DOCUSATE SODIUM 100 MG PO CAPS
100.0000 mg | ORAL_CAPSULE | Freq: Two times a day (BID) | ORAL | Status: DC
  Administered 2015-08-22 – 2015-08-27 (×10): 100 mg via ORAL
  Filled 2015-08-22 (×9): qty 1

## 2015-08-22 MED ORDER — POLYETHYLENE GLYCOL 3350 17 G PO PACK
17.0000 g | PACK | Freq: Every day | ORAL | Status: DC
  Administered 2015-08-22 – 2015-08-27 (×6): 17 g via ORAL
  Filled 2015-08-22 (×5): qty 1

## 2015-08-22 MED ORDER — BUPROPION HCL ER (XL) 150 MG PO TB24
150.0000 mg | ORAL_TABLET | Freq: Every day | ORAL | Status: DC
  Administered 2015-08-22 – 2015-08-27 (×6): 150 mg via ORAL
  Filled 2015-08-22 (×8): qty 1

## 2015-08-22 MED ORDER — GABAPENTIN 600 MG PO TABS
1200.0000 mg | ORAL_TABLET | Freq: Every day | ORAL | Status: DC
  Filled 2015-08-22 (×3): qty 2

## 2015-08-22 MED ORDER — ASPIRIN 325 MG PO TABS
325.0000 mg | ORAL_TABLET | Freq: Every day | ORAL | Status: DC
  Administered 2015-08-23 – 2015-08-27 (×5): 325 mg via ORAL
  Filled 2015-08-22 (×5): qty 1

## 2015-08-22 MED ORDER — PRAVASTATIN SODIUM 40 MG PO TABS
40.0000 mg | ORAL_TABLET | Freq: Every day | ORAL | Status: DC
  Administered 2015-08-22 – 2015-08-26 (×5): 40 mg via ORAL
  Filled 2015-08-22 (×5): qty 1

## 2015-08-22 MED ORDER — INSULIN DETEMIR 100 UNIT/ML ~~LOC~~ SOLN
25.0000 [IU] | Freq: Every day | SUBCUTANEOUS | Status: DC
  Administered 2015-08-22 – 2015-08-26 (×5): 25 [IU] via SUBCUTANEOUS
  Filled 2015-08-22 (×6): qty 0.25

## 2015-08-22 MED ORDER — METOPROLOL TARTRATE 50 MG PO TABS
50.0000 mg | ORAL_TABLET | Freq: Two times a day (BID) | ORAL | Status: DC
  Administered 2015-08-22 – 2015-08-27 (×10): 50 mg via ORAL
  Filled 2015-08-22 (×10): qty 1

## 2015-08-22 MED ORDER — PANTOPRAZOLE SODIUM 40 MG PO TBEC
40.0000 mg | DELAYED_RELEASE_TABLET | Freq: Two times a day (BID) | ORAL | Status: DC
  Administered 2015-08-22 – 2015-08-27 (×10): 40 mg via ORAL
  Filled 2015-08-22 (×10): qty 1

## 2015-08-22 MED ORDER — SODIUM CHLORIDE 0.9 % IV BOLUS (SEPSIS)
1000.0000 mL | Freq: Once | INTRAVENOUS | Status: AC
  Administered 2015-08-22: 1000 mL via INTRAVENOUS

## 2015-08-22 MED ORDER — SODIUM CHLORIDE 0.9 % IV SOLN
INTRAVENOUS | Status: AC
  Administered 2015-08-22: 12:00:00 via INTRAVENOUS

## 2015-08-22 MED ORDER — OXYCODONE-ACETAMINOPHEN 5-325 MG PO TABS
1.0000 | ORAL_TABLET | Freq: Four times a day (QID) | ORAL | Status: DC | PRN
  Filled 2015-08-22: qty 1

## 2015-08-22 MED ORDER — GABAPENTIN 400 MG PO CAPS
1200.0000 mg | ORAL_CAPSULE | Freq: Every day | ORAL | Status: DC
  Administered 2015-08-22 – 2015-08-26 (×5): 1200 mg via ORAL
  Filled 2015-08-22 (×5): qty 3

## 2015-08-22 MED ORDER — ENSURE ENLIVE PO LIQD
237.0000 mL | Freq: Two times a day (BID) | ORAL | Status: DC
  Administered 2015-08-22 – 2015-08-26 (×9): 237 mL via ORAL
  Filled 2015-08-22 (×2): qty 237

## 2015-08-22 MED ORDER — DEXTROSE 5 % IV SOLN
1.0000 g | INTRAVENOUS | Status: DC
  Administered 2015-08-23 – 2015-08-26 (×4): 1 g via INTRAVENOUS
  Filled 2015-08-22 (×6): qty 10

## 2015-08-22 MED ORDER — POTASSIUM CHLORIDE CRYS ER 20 MEQ PO TBCR
20.0000 meq | EXTENDED_RELEASE_TABLET | Freq: Two times a day (BID) | ORAL | Status: DC
  Administered 2015-08-22 – 2015-08-25 (×7): 20 meq via ORAL
  Filled 2015-08-22 (×7): qty 1

## 2015-08-22 MED ORDER — INSULIN ASPART 100 UNIT/ML ~~LOC~~ SOLN
0.0000 [IU] | Freq: Three times a day (TID) | SUBCUTANEOUS | Status: DC
  Administered 2015-08-22 – 2015-08-23 (×3): 3 [IU] via SUBCUTANEOUS
  Administered 2015-08-23 – 2015-08-24 (×2): 4 [IU] via SUBCUTANEOUS
  Administered 2015-08-24: 3 [IU] via SUBCUTANEOUS
  Administered 2015-08-24 – 2015-08-25 (×2): 4 [IU] via SUBCUTANEOUS
  Administered 2015-08-25: 3 [IU] via SUBCUTANEOUS
  Administered 2015-08-25: 4 [IU] via SUBCUTANEOUS
  Administered 2015-08-26: 3 [IU] via SUBCUTANEOUS
  Administered 2015-08-26: 4 [IU] via SUBCUTANEOUS
  Administered 2015-08-26: 3 [IU] via SUBCUTANEOUS
  Administered 2015-08-27: 4 [IU] via SUBCUTANEOUS

## 2015-08-22 MED ORDER — ONDANSETRON HCL 4 MG PO TABS
8.0000 mg | ORAL_TABLET | Freq: Four times a day (QID) | ORAL | Status: DC
  Administered 2015-08-22 – 2015-08-27 (×16): 8 mg via ORAL
  Filled 2015-08-22 (×18): qty 2

## 2015-08-22 MED ORDER — SENNOSIDES-DOCUSATE SODIUM 8.6-50 MG PO TABS
1.0000 | ORAL_TABLET | Freq: Every evening | ORAL | Status: DC | PRN
  Administered 2015-08-24: 1 via ORAL
  Filled 2015-08-22: qty 1

## 2015-08-22 MED ORDER — VERAPAMIL HCL ER 240 MG PO TBCR
240.0000 mg | EXTENDED_RELEASE_TABLET | Freq: Every day | ORAL | Status: DC
  Administered 2015-08-23 – 2015-08-27 (×5): 240 mg via ORAL
  Filled 2015-08-22 (×5): qty 1

## 2015-08-22 MED ORDER — STROKE: EARLY STAGES OF RECOVERY BOOK
Freq: Once | Status: AC
  Administered 2015-08-22: 18:00:00
  Filled 2015-08-22: qty 1

## 2015-08-22 MED ORDER — SODIUM CHLORIDE 0.9 % IV SOLN
INTRAVENOUS | Status: DC
  Administered 2015-08-22 – 2015-08-26 (×6): via INTRAVENOUS

## 2015-08-22 MED ORDER — DEXTROSE 5 % IV SOLN
1.0000 g | Freq: Once | INTRAVENOUS | Status: AC
  Administered 2015-08-22: 1 g via INTRAVENOUS
  Filled 2015-08-22: qty 10

## 2015-08-22 MED ORDER — INSULIN ASPART 100 UNIT/ML ~~LOC~~ SOLN
0.0000 [IU] | Freq: Every day | SUBCUTANEOUS | Status: DC
Start: 2015-08-22 — End: 2015-08-27
  Administered 2015-08-27: 4 [IU] via SUBCUTANEOUS

## 2015-08-22 MED ORDER — TOPIRAMATE 25 MG PO TABS
25.0000 mg | ORAL_TABLET | Freq: Two times a day (BID) | ORAL | Status: DC
  Administered 2015-08-22 – 2015-08-27 (×10): 25 mg via ORAL
  Filled 2015-08-22 (×14): qty 1

## 2015-08-22 NOTE — Progress Notes (Signed)
Received report  regarding patient coming to rm [id number]. Oswald Hillock, RN

## 2015-08-22 NOTE — ED Notes (Signed)
Teleneurology is progress at this time. Daughter is present at bedside at this time.

## 2015-08-22 NOTE — H&P (Signed)
History and Physical    Lori Mahoney [id number] DOB: 1945/11/12 DOA: 08/22/2015  Referring MD/NP/PA: Ezequiel Essex, MD PCP: Hildred Laser, MD  Outpatient Specialists:  Patient coming from: [facility]  Chief Complaint: code stroke  HPI: Lori Mahoney is a 70 y.o. female with medical history significant of DM, essential HTN, HLD, presents to the ED via EMS with complaints of worsening, left sided weakness and slurred speech. Per [facility] staff, patient appeared as though she forgot how to swallow last night while receiving her medications. They also reported that she received Zofran at 6:00am this morning and appeared to be acting normal. The patient notes that she has chronic left sided weakness, present since prior stroke in 01/2015, but that her current weakness is worse than baseline. According to daughter, patient had been having difficulty with speech over last several days. She had noted that patient was increasingly lethargic over last few days. This morning is when left sided weakness was noted which prompted ER visit. Reports back pain, which is a chronic issue, but has acutely worsened. Denies HA, CP, or abd pain.   ED Course: While being evaluated in the ED, CMP showed an elevated BUN and Cr. Her CBC revealed an elevated WBC. UA indicative of infection. CT head revealed an acute infarct so code stroke was called, but she was not felt to be a candidate for tPA. Hospitalist was asked to refer for admission for further management of acute CVA.  Review of Systems: As per HPI otherwise 10 point review of systems negative.   Past Medical History:  Diagnosis Date  . Anemia    borderline anemia   . Arthritis    hands, hips, back   . Cancer ([place])   . Complication of anesthesia   . Depression   . Diabetes mellitus   . Difficulty walking   . Disc degeneration, lumbar   . Gastroparesis   . GERD (gastroesophageal reflux disease)   . H/O exercise stress test    good result-  2003.  Also had Echocardiogram 10 yrs. ago, told that valve was thickening, but no need for changes , no need for f/u  . H/O hiatal hernia   . Headache   . Hyperlipidemia   . Hypertension   . Neuromuscular disorder (HCC)    neuropathy- legs & feet   . Neuropathy ([place])   . PONV (postoperative nausea and vomiting)   . Shortness of breath   . Spinal stenosis   . Stroke ([place])   . UTI (lower urinary tract infection)     Past Surgical History:  Procedure Laterality Date  . ABDOMINAL HYSTERECTOMY    . BACK SURGERY  02/2009   L3-S1 fusion  . COLONOSCOPY    . ESOPHAGOGASTRODUODENOSCOPY N/A 02/05/2015   Procedure: ESOPHAGOGASTRODUODENOSCOPY (EGD);  Surgeon: [name], MD;  Location: [facility];  Service: Endoscopy;  Laterality: N/A;  . EYE SURGERY     cataracts removed- R eye    . JOINT REPLACEMENT  2005   left  . KNEE SURGERY    . STAPEDECTOMY     bilateral, then a 2nd time to R ear  . UPPER GASTROINTESTINAL ENDOSCOPY       reports that she has never smoked. She has never used smokeless tobacco. She reports that she does not drink alcohol or use drugs.  Allergies  Allergen Reactions  . Codeine Nausea And Vomiting  . Hydrocodone-Acetaminophen Nausea And Vomiting  . Meperidine Hcl Nausea And Vomiting  .  Contrast Media [Iodinated Diagnostic Agents] Nausea And Vomiting  . Oxycodone Nausea And Vomiting  . Tetracyclines & Related Rash    Family History  Problem Relation Age of Onset  . Colon cancer Mother     age 72  . Breast cancer Sister   . Diabetes Brother   . Arthritis    . Cancer Brother     prostate    Prior to Admission medications   Medication Sig Start Date End Date Taking? Authorizing Provider  acetaminophen (TYLENOL) 325 MG tablet Take 2 tablets (650 mg total) by mouth every 6 (six) hours as needed for mild pain, moderate pain, fever or headache. 02/06/15   [name], MD  aspirin EC 81 MG tablet Take 81 mg by mouth daily.    Historical Provider, MD    buPROPion (WELLBUTRIN XL) 150 MG 24 hr tablet Take 150 mg by mouth daily.    Historical Provider, MD  cephALEXin (KEFLEX) 500 MG capsule Take 1 capsule (500 mg total) by mouth 4 (four) times daily. Patient not taking: Reported on 03/18/2015 02/11/15   Milton Ferguson, MD  citalopram (CELEXA) 20 MG tablet Take 1 tablet (20 mg total) by mouth daily. Patient not taking: Reported on 02/11/2015 02/06/15   [name], MD  docusate sodium (COLACE) 100 MG capsule Take 100 mg by mouth 2 (two) times daily.    Historical Provider, MD  gabapentin (NEURONTIN) 300 MG capsule Take 1,200 mg by mouth at bedtime.  09/26/10   Historical Provider, MD  insulin aspart (NOVOLOG) 100 UNIT/ML injection Inject 0-20 Units into the skin 3 (three) times daily with meals. Patient taking differently: Inject 0-20 Units into the skin 3 (three) times daily with meals. Sliding Scale: <60--Call MD 121-150= 3 units 151-200= 4 units 201-250= 7 units 251-300=11units 301-350=15units 351-400=20units >400= Call MD 02/06/15   [name], MD  insulin detemir (LEVEMIR) 100 UNIT/ML injection Inject 0.25 mLs (25 Units total) into the skin at bedtime. 02/06/15   [name], MD  lidocaine (LIDODERM) 5 % Place 1 patch onto the skin daily. Remove & Discard patch within 12 hours or as directed by MD Patient not taking: Reported on 11/30/2014 08/21/13   [name], MD  metoCLOPramide (REGLAN) 10 MG tablet Take 1 tablet (10 mg total) by mouth 4 (four) times daily -  before meals and at bedtime. Patient not taking: Reported on 03/18/2015 02/06/15   [name], MD  metoprolol (LOPRESSOR) 50 MG tablet Take 1 tablet (50 mg total) by mouth 2 (two) times daily. 02/06/15   [name], MD  Multiple Vitamins-Minerals (CENTRUM SILVER PO) Take 1 tablet by mouth daily.    Historical Provider, MD  ondansetron (ZOFRAN) 4 MG tablet Take 1 tablet (4 mg total) by mouth every 6 (six) hours as needed for nausea. 02/06/15   [name], MD   ondansetron (ZOFRAN) 8 MG tablet Take 1 tablet (8 mg total) by mouth 4 (four) times daily -  before meals and at bedtime. 02/06/15   [name], MD  oxyCODONE-acetaminophen (PERCOCET/ROXICET) 5-325 MG tablet Take 1 tablet by mouth every 6 (six) hours as needed. Patient not taking: Reported on 03/18/2015 11/30/14   Milton Ferguson, MD  pantoprazole (PROTONIX) 40 MG tablet Take 1 tablet (40 mg total) by mouth 2 (two) times daily. 02/06/15   [name], MD  potassium chloride SA (K-DUR,KLOR-CON) 20 MEQ tablet Take 2 tablets (40 mEq total) by mouth daily. 02/06/15   [name], MD  pravastatin (PRAVACHOL) 40 MG tablet  Take 40 mg by mouth at bedtime.     Historical Provider, MD  verapamil (CALAN-SR) 240 MG CR tablet Take 240 mg by mouth daily with breakfast.  09/23/10   Historical Provider, MD   Physical Exam: Vitals:   08/22/15 0900 08/22/15 0915 08/22/15 0930 08/22/15 0934  BP: 96/57 109/62 107/69   Pulse: 71 71 76   Resp: 19 16 14    Temp:    98.3 F (36.8 C)  TempSrc:      SpO2: 93% 95% 95%   Weight:      Height:       Constitutional: NAD, calm, comfortable Vitals:   08/22/15 0900 08/22/15 0915 08/22/15 0930 08/22/15 0934  BP: 96/57 109/62 107/69   Pulse: 71 71 76   Resp: 19 16 14    Temp:    98.3 F (36.8 C)  TempSrc:      SpO2: 93% 95% 95%   Weight:      Height:       Eyes: PERRL, lids and conjunctivae normal ENMT: Mucous membranes are moist. Posterior pharynx clear of any exudate or lesions.Normal dentition.  Neck: normal, supple, no masses, no thyromegaly Respiratory: clear to auscultation bilaterally, no wheezing, no crackles. Normal respiratory effort. No accessory muscle use.  Cardiovascular: Regular rate and rhythm, no murmurs / rubs / gallops. No extremity edema. 2+ pedal pulses. No carotid bruits.  Abdomen: no tenderness, no masses palpated. No hepatosplenomegaly. Bowel sounds positive.  Musculoskeletal: no clubbing / cyanosis. No joint deformity upper and lower  extremities. Good ROM, no contractures. Normal muscle tone.  Skin: no rashes, lesions, ulcers. No induration Neurologic: speech is dysarthric. Strength is 3/5 in RLE, 0/5 in LLE, 0/5 in LUE, 4/5 in RUE, some left sided neglect Psychiatric:  Alert and oriented x 3. Normal mood.   Labs on Admission: I have personally reviewed following labs and imaging studies  CBC:  Recent Labs Lab 08/22/15 0811 08/22/15 0818  WBC 21.5*  --   NEUTROABS 18.2*  --   HGB 12.1 12.2  HCT 36.4 36.0  MCV 89.9  --   PLT 179  --    Basic Metabolic Panel:  Recent Labs Lab 08/22/15 0811 08/22/15 0818  NA 136 138  K 4.1 3.9  CL 105 101  CO2 24  --   GLUCOSE 127* 123*  BUN 42* 38*  CREATININE 2.53* 2.60*  CALCIUM 9.1  --    GFR: Estimated Creatinine Clearance: 20 mL/min (by C-G formula based on SCr of 2.6 mg/dL). Liver Function Tests:  Recent Labs Lab 08/22/15 0811  AST 24  ALT 25  ALKPHOS 78  BILITOT 0.4  PROT 6.7  ALBUMIN 3.2*   Coagulation Profile:  Recent Labs Lab 08/22/15 0811  INR 1.10   CBG:  Recent Labs Lab 08/22/15 0813  GLUCAP 128*   Urine analysis:    Component Value Date/Time   COLORURINE YELLOW 08/22/2015 0900   APPEARANCEUR HAZY (A) 08/22/2015 0900   LABSPEC >1.030 (H) 08/22/2015 0900   PHURINE 5.0 08/22/2015 0900   GLUCOSEU NEGATIVE 08/22/2015 0900   HGBUR NEGATIVE 08/22/2015 0900   BILIRUBINUR NEGATIVE 08/22/2015 0900   KETONESUR NEGATIVE 08/22/2015 0900   PROTEINUR NEGATIVE 08/22/2015 0900   UROBILINOGEN 0.2 06/21/2013 1426   NITRITE NEGATIVE 08/22/2015 0900   LEUKOCYTESUR MODERATE (A) 08/22/2015 0900   Sepsis Labs: @LABRCNTIP (procalcitonin:4,lacticidven:4) )No results found for this or any previous visit (from the past 240 hour(s)).   Radiological Exams on Admission: Ct Head Code Stroke W/o Cm  Result Date: 08/22/2015 CLINICAL DATA:  Aphasia.  Left-sided weakness. EXAM: CT HEAD WITHOUT CONTRAST TECHNIQUE: Contiguous axial images were obtained  from the base of the skull through the vertex without intravenous contrast. COMPARISON:  02/18/2015 FINDINGS: There is a wedge shaped hypoattenuated area within the right frontal lobe, likely representing an acute infarction. A smaller area of hypoattenuation is seen in the posterior left frontal lobe, which may also represent an age-indeterminate infarct. No mass effect or midline shift. No evidence of acute intracranial hemorrhage. No abnormal extra-axial fluid collections. There is a stable moderate to severe brain parenchymal volume loss and advanced periventricular white matter disease. There may be prior bilateral basal ganglia lacunar infarcts. Basal cisterns are preserved. No depressed skull fractures. Visualized paranasal sinuses and mastoid air cells are not opacified. IMPRESSION: 1. Acute right frontal lobe infarct, likely within right ACA territory. No evidence of hemorrhagic transformation. 2. Probable small age-indeterminate left frontal infarct. 3. Advanced brain parenchymal atrophy, and chronic microvascular disease. 4. Probable bilateral basal ganglia old lacunar infarcts. Critical Value/emergent results were called by telephone at the time of interpretation on 08/22/2015 at 8:35 am to Dr. Ezequiel Essex , who verbally acknowledged these results. Electronically Signed   By: Fidela Salisbury M.D.   On: 08/22/2015 08:39   EKG: Independently reviewed. SR, RBBB no significant change.  Assessment/Plan Active Problems:   CVA (cerebral infarction)   1. Acute CVA. CT head shows acute right frontal lobe infarct, likely within right ACA territory, no evidence of hemorrhagic transformation. CT head also shows probably small age-indeterminate left frontal infarct and probable bilateral basal ganglia old lacunar infarcts. Continue the patient on aspirin. Daughter reports that patient is not able to have MRI done due to retained stapedal wiring. Will consult neurology. Check lipid profile and A1C.  PT/OT evaluation. She has been seen by speech therapy and has been cleared for regular consistency food.  2. Left hemiplegia. Related to stroke. PT/OT evalation 3. UTI. UA indicative of infection. Check urine cultures. Will give rocephin and fluids. 4. AKI, secondary to dehydration. Creatinine elevated around 2.5, baseline roughly 1. Will give IV fluids. Check BMP in the AM. 5. Leukocytosis. Likely related to hemoconcentration. Anticipate improvement with fluids. Repeat CBC in the AM. 6. Dehydration. BUN/Cr and urine specific gravity elevation. Anticipate improvement with IV hydration. 7. HLD. Check lipid profile. Continue pravastatin 8. Essential HTN. Continue outpatient regimen 9. DM. Blood sugars stable. Continue SSI. 10. Depression. Continue outpatient regimen. 11. Diabetic gastroparesis. Not able to take reglan due to development of parkinson symptoms in the past. Treated with phenergan and zofran   DVT prophylaxis: Lovenox Code Status: DNR Family Communication: discussed with patient, and daughter at the bedside Disposition Plan: admit to medical floor, discharge home once improved.  Consults called: Neurology  Admission status: admit as inpatient to telemetry floor.  Kathie Dike, MD [facility] Pager [phone]  If 7PM-7AM, please contact night-coverage [email or web address] Password [email or web address]  08/22/2015, 9:54 AM   By signing my name below, I, Delene Ruffini, attest that this documentation has been prepared under the direction and in the presence of Kathie Dike, MD. Electronically Signed: Delene Ruffini 08/22/15  I, Dr. Kathie Dike, personally performed the services described in this documentaiton. All medical record entries made by the scribe were at my direction and in my presence. I have reviewed the chart and agree that the record reflects my personal performance and is accurate and complete  Kathie Dike, MD, 08/22/2015 4:50 PM

## 2015-08-22 NOTE — Progress Notes (Signed)
Beeper [id number] (pt not in the building at that time) Exam started 818 Exam ended 828 Sent to soc and called gr [phone]

## 2015-08-22 NOTE — ED Notes (Signed)
Called SOC for CODE STROKE.

## 2015-08-22 NOTE — ED Provider Notes (Signed)
[facility] DEPT Provider Note   CSN: [id number] Arrival date & time: 08/22/15  0809  First Provider Contact:  8:10 AM    By signing my name below, I, Rayna Sexton, attest that this documentation has been prepared under the direction and in the presence of Ezequiel Essex, MD. Electronically Signed: Rayna Sexton, ED Scribe. 08/22/15. 8:30 AM.   History   Chief Complaint Chief Complaint  Patient presents with  . Code Stroke   LEVEL 5 CAVEAT: ACUITY OF CONDITION   HPI HPI Comments: Lori Mahoney is a 70 y.o. female who presents to the Emergency Department by ambulance complaining of worsening, moderate, left sided weakness onset at 7:25 AM this morning. Per EMS, pt was experiencing slurred speech at 7:25 am this morning. Per [facility] staff, pt was given zofran at 6:00 am this morning and was acting nml at the time further noting that last night when being given her medications she "acted like she forgot how to swallow". Pt notes current left sided weakness which has been present since a prior stroke but states her current weakness is worse than her baseline. She reports back pain which is a chronic issue and denies it has acutely worsened. She denies HA, CP and abd pain.    The history is provided by the patient, the EMS personnel and medical records. The history is limited by the condition of the patient. No language interpreter was used.    Past Medical History:  Diagnosis Date  . Anemia    borderline anemia   . Arthritis    hands, hips, back   . Cancer ([place])   . Complication of anesthesia   . Depression   . Diabetes mellitus   . Difficulty walking   . Disc degeneration, lumbar   . Gastroparesis   . GERD (gastroesophageal reflux disease)   . H/O exercise stress test    good result- 2003.  Also had Echocardiogram 10 yrs. ago, told that valve was thickening, but no need for changes , no need for f/u  . H/O hiatal hernia   . Headache   . Hyperlipidemia   .  Hypertension   . Neuromuscular disorder (HCC)    neuropathy- legs & feet   . Neuropathy ([place])   . PONV (postoperative nausea and vomiting)   . Shortness of breath   . Spinal stenosis   . Stroke ([place])   . UTI (lower urinary tract infection)     Patient Active Problem List   Diagnosis Date Noted  . CVA (cerebral infarction) 08/22/2015  . AKI (acute kidney injury) ([place]) 08/22/2015  . Left-sided weakness 08/22/2015  . Stroke (cerebrum) ([place]) 08/22/2015  . Dehydration 02/06/2015  . Intractable nausea and vomiting 02/03/2015  . Hypokalemia 02/03/2015  . Depression 02/03/2015  . Gastroparesis 01/30/2015  . 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 Vomiting 06/21/2013  . HTN (hypertension) 06/21/2013  . Dyslipidemia 06/21/2013  . Bursitis of shoulder, right 02/01/2012  . Shoulder pain 02/01/2012  . UTI (lower urinary tract infection) 10/09/2011  . Metabolic encephalopathy [id number]  . Nausea and vomiting 12/29/2010  . Hip pain 10/20/2010  . DDD (degenerative disc disease), lumbar 10/20/2010  . Diabetes ([place]) 12/23/2008  . SPINAL STENOSIS 12/23/2008  . LOW BACK PAIN 12/23/2008  . 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 Essential hypertension 12/18/2008    Past Surgical History:  Procedure Laterality Date  . ABDOMINAL HYSTERECTOMY    . BACK SURGERY  02/2009   L3-S1 fusion  . COLONOSCOPY    .  ESOPHAGOGASTRODUODENOSCOPY N/A 02/05/2015   Procedure: ESOPHAGOGASTRODUODENOSCOPY (EGD);  Surgeon: [name], MD;  Location: [facility] ENDO SUITE;  Service: Endoscopy;  Laterality: N/A;  . EYE SURGERY     cataracts removed- R eye    . JOINT REPLACEMENT  2005   left  . KNEE SURGERY    . STAPEDECTOMY     bilateral, then a 2nd time to R ear  . UPPER GASTROINTESTINAL ENDOSCOPY      OB History    No data available       Home Medications    Prior to Admission medications   Medication Sig Start Date End Date Taking? Authorizing Provider  acetaminophen (TYLENOL) 325 MG tablet Take 2 tablets  (650 mg total) by mouth every 6 (six) hours as needed for mild pain, moderate pain, fever or headache. 02/06/15  Yes [name], MD  aspirin EC 81 MG tablet Take 81 mg by mouth daily.   Yes Historical Provider, MD  buPROPion (WELLBUTRIN XL) 150 MG 24 hr tablet Take 150 mg by mouth daily.   Yes Historical Provider, MD  cholecalciferol (VITAMIN D) 1000 units tablet Take 1,000 Units by mouth daily.   Yes Historical Provider, MD  docusate sodium (COLACE) 100 MG capsule Take 100 mg by mouth 2 (two) times daily.   Yes Historical Provider, MD  feeding supplement (ENSURE IMMUNE HEALTH) LIQD Take 237 mLs by mouth 2 (two) times daily.   Yes Historical Provider, MD  gabapentin (NEURONTIN) 600 MG tablet Take 1,200 mg by mouth at bedtime.   Yes Historical Provider, MD  guaiFENesin-dextromethorphan (ROBITUSSIN DM) 100-10 MG/5ML syrup Take 15 mLs by mouth every 6 (six) hours as needed for cough.   Yes Historical Provider, MD  insulin detemir (LEVEMIR) 100 UNIT/ML injection Inject 0.25 mLs (25 Units total) into the skin at bedtime. 02/06/15  Yes [name], MD  insulin lispro (HUMALOG KWIKPEN) 100 UNIT/ML KiwkPen Inject 3-20 Units into the skin 3 (three) times daily. Sliding Scale: <60--Call MD 121-150= 3 units 151-200= 4 units 201-250= 7 units 251-300=11units 301-350=15units 351-400=20units >400= Call MD   Yes Historical Provider, MD  insulin lispro (HUMALOG KWIKPEN) 100 UNIT/ML KiwkPen Inject 2-5 Units into the skin at bedtime. Per sliding scale: If blood sugar is less than 60, call MD. 201-250= 2 units 251-300= 3 units 301-350= 4 units 351-400= 5 units >400= Call MD   Yes Historical Provider, MD  metoprolol (LOPRESSOR) 50 MG tablet Take 1 tablet (50 mg total) by mouth 2 (two) times daily. 02/06/15  Yes [name], MD  Multiple Vitamins-Minerals (CENTRUM SILVER PO) Take 1 tablet by mouth daily.   Yes Historical Provider, MD  ondansetron (ZOFRAN) 8 MG tablet Take 1 tablet (8 mg total) by mouth 4  (four) times daily -  before meals and at bedtime. Patient taking differently: Take 8 mg by mouth every 6 (six) hours.  02/06/15  Yes [name], MD  oxyCODONE-acetaminophen (PERCOCET/ROXICET) 5-325 MG tablet Take 1 tablet by mouth every 6 (six) hours as needed. Patient taking differently: Take 1 tablet by mouth every 6 (six) hours as needed for moderate pain.  11/30/14  Yes Milton Ferguson, MD  pantoprazole (PROTONIX) 40 MG tablet Take 1 tablet (40 mg total) by mouth 2 (two) times daily. 02/06/15  Yes [name], MD  polyethylene glycol [facility] / GLYCOLAX) packet Take 17 g by mouth daily.   Yes Historical Provider, MD  potassium chloride SA (K-DUR,KLOR-CON) 20 MEQ tablet Take 2 tablets (40 mEq total) by mouth daily. Patient  taking differently: Take 20 mEq by mouth 2 (two) times daily.  02/06/15  Yes [name], MD  pravastatin (PRAVACHOL) 40 MG tablet Take 40 mg by mouth at bedtime.    Yes Historical Provider, MD  promethazine (PHENERGAN) 25 MG tablet Take 25 mg by mouth every 8 (eight) hours as needed for nausea or vomiting.    Yes Historical Provider, MD  topiramate (TOPAMAX) 25 MG tablet Take 25 mg by mouth 2 (two) times daily.    Yes Historical Provider, MD  verapamil (CALAN-SR) 240 MG CR tablet Take 240 mg by mouth daily with breakfast.  09/23/10  Yes Historical Provider, MD    Family History Family History  Problem Relation Age of Onset  . Colon cancer Mother     age 28  . Breast cancer Sister   . Diabetes Brother   . Arthritis    . Cancer Brother     prostate    Social History Social History  Substance Use Topics  . Smoking status: Never Smoker  . Smokeless tobacco: Never Used  . Alcohol use No     Allergies   Codeine; Hydrocodone-acetaminophen; Meperidine hcl; Contrast media [iodinated diagnostic agents]; Oxycodone; and Tetracyclines & related   Review of Systems Review of Systems  Unable to perform ROS: Acuity of condition  Cardiovascular: Negative for chest  pain.  Gastrointestinal: Negative for abdominal pain.  Musculoskeletal: Positive for back pain.  Neurological: Positive for speech difficulty and weakness. Negative for headaches.  All other systems reviewed and are negative.  Physical Exam Updated Vital Signs BP 134/66 (BP Location: Right Arm)   Pulse 77   Temp 99.1 F (37.3 C) (Oral)   Resp 17   Ht 5\' 3"  (1.6 m)   Wt 182 lb 12.2 oz (82.9 kg)   SpO2 97%   BMI 32.37 kg/m    Physical Exam  Constitutional: She is oriented to person, place, and time. She appears well-developed and well-nourished. No distress.  HENT:  Head: Normocephalic and atraumatic.  Mouth/Throat: Oropharynx is clear and moist. No oropharyngeal exudate.  Eyes: Conjunctivae and EOM are normal. Pupils are equal, round, and reactive to light.  Neck: Normal range of motion. Neck supple.  No meningismus.  Cardiovascular: Normal rate, regular rhythm, normal heart sounds and intact distal pulses.   No murmur heard. Pulmonary/Chest: Effort normal and breath sounds normal. No respiratory distress.  Abdominal: Soft. There is no tenderness. There is no rebound and no guarding.  Musculoskeletal: Normal range of motion. She exhibits no edema or tenderness.  Neurological: She is alert and oriented to person, place, and time. No cranial nerve deficit. She exhibits normal muscle tone. Coordination normal.  Right gaze preference; dysarthric speech; 3/5 strength of left arm and leg; 5/5 strength of right arm and leg; left gaze able to cross midline; difficulty following some commands; will not smile or stick tongue out  Skin: Skin is warm.  Psychiatric: She has a normal mood and affect. Her behavior is normal.  Nursing note and vitals reviewed.   ED Treatments / Results  Labs (all labs ordered are listed, but only abnormal results are displayed) Labs Reviewed  CBC - Abnormal; Notable for the following:       Result Value   WBC 21.5 (*)    All other components within  normal limits  DIFFERENTIAL - Abnormal; Notable for the following:    Neutro Abs 18.2 (*)    Monocytes Absolute 1.1 (*)    All other components within normal  limits  COMPREHENSIVE METABOLIC PANEL - Abnormal; Notable for the following:    Glucose, Bld 127 (*)    BUN 42 (*)    Creatinine, Ser 2.53 (*)    Albumin 3.2 (*)    GFR calc non Af Amer 18 (*)    GFR calc Af Amer 21 (*)    All other components within normal limits  URINALYSIS, ROUTINE W REFLEX MICROSCOPIC (NOT AT [facility]) - Abnormal; Notable for the following:    APPearance HAZY (*)    Specific Gravity, Urine >1.030 (*)    Leukocytes, UA MODERATE (*)    All other components within normal limits  URINE MICROSCOPIC-ADD ON - Abnormal; Notable for the following:    Squamous Epithelial / LPF TOO NUMEROUS TO COUNT (*)    Bacteria, UA MANY (*)    All other components within normal limits  I-STAT CHEM 8, ED - Abnormal; Notable for the following:    BUN 38 (*)    Creatinine, Ser 2.60 (*)    Glucose, Bld 123 (*)    Calcium, Ion 1.26 (*)    All other components within normal limits  CBG MONITORING, ED - Abnormal; Notable for the following:    Glucose-Capillary 128 (*)    All other components within normal limits  URINE CULTURE  URINE CULTURE  ETHANOL  PROTIME-INR  APTT  URINE RAPID DRUG SCREEN, HOSP PERFORMED  HEMOGLOBIN A1C  LIPID PANEL  COMPREHENSIVE METABOLIC PANEL  CBC  I-STAT TROPOININ, ED    EKG  EKG Interpretation  Date/Time:  Friday August 22 2015 08:27:22 EDT Ventricular Rate:  73 PR Interval:    QRS Duration: 150 QT Interval:  462 QTC Calculation: 510 R Axis:   31 Text Interpretation:  Sinus rhythm Right bundle branch block No significant change was found Confirmed by Wyvonnia Dusky  MD, Talar Fraley [phone]) on 08/22/2015 8:32:20 AM       Radiology Ct Head Code Stroke W/o Cm  Result Date: 08/22/2015 CLINICAL DATA:  Aphasia.  Left-sided weakness. EXAM: CT HEAD WITHOUT CONTRAST TECHNIQUE: Contiguous axial images were  obtained from the base of the skull through the vertex without intravenous contrast. COMPARISON:  02/18/2015 FINDINGS: There is a wedge shaped hypoattenuated area within the right frontal lobe, likely representing an acute infarction. A smaller area of hypoattenuation is seen in the posterior left frontal lobe, which may also represent an age-indeterminate infarct. No mass effect or midline shift. No evidence of acute intracranial hemorrhage. No abnormal extra-axial fluid collections. There is a stable moderate to severe brain parenchymal volume loss and advanced periventricular white matter disease. There may be prior bilateral basal ganglia lacunar infarcts. Basal cisterns are preserved. No depressed skull fractures. Visualized paranasal sinuses and mastoid air cells are not opacified. IMPRESSION: 1. Acute right frontal lobe infarct, likely within right ACA territory. No evidence of hemorrhagic transformation. 2. Probable small age-indeterminate left frontal infarct. 3. Advanced brain parenchymal atrophy, and chronic microvascular disease. 4. Probable bilateral basal ganglia old lacunar infarcts. Critical Value/emergent results were called by telephone at the time of interpretation on 08/22/2015 at 8:35 am to Dr. Ezequiel Essex , who verbally acknowledged these results. Electronically Signed   By: Fidela Salisbury M.D.   On: 08/22/2015 08:39   Procedures Procedures  COORDINATION OF CARE: 8:14 AM Discussed next steps with pt. Pt verbalized understanding and is agreeable with the plan.    Medications Ordered in ED Medications  0.9 %  sodium chloride infusion ( Intravenous New Bag/Given 08/22/15 1130)  feeding supplement (  ENSURE IMMUNE HEALTH) liquid 237 mL (not administered)  cholecalciferol (VITAMIN D) tablet 1,000 Units (not administered)  gabapentin (NEURONTIN) tablet 1,200 mg (not administered)  polyethylene glycol (MIRALAX / GLYCOLAX) packet 17 g (not administered)  topiramate (TOPAMAX) tablet  25 mg (not administered)  buPROPion (WELLBUTRIN XL) 24 hr tablet 150 mg (not administered)  docusate sodium (COLACE) capsule 100 mg (not administered)  insulin detemir (LEVEMIR) injection 25 Units (not administered)  metoprolol (LOPRESSOR) tablet 50 mg (not administered)  ondansetron (ZOFRAN) tablet 8 mg (not administered)  pantoprazole (PROTONIX) EC tablet 40 mg (not administered)  potassium chloride SA (K-DUR,KLOR-CON) CR tablet 20 mEq (not administered)  pravastatin (PRAVACHOL) tablet 40 mg (not administered)  oxyCODONE-acetaminophen (PERCOCET/ROXICET) 5-325 MG per tablet 1 tablet (not administered)  verapamil (CALAN-SR) CR tablet 240 mg (not administered)   stroke: mapping our early stages of recovery book (not administered)  0.9 %  sodium chloride infusion (not administered)  senna-docusate (Senokot-S) tablet 1 tablet (not administered)  enoxaparin (LOVENOX) injection 40 mg (not administered)  cefTRIAXone (ROCEPHIN) 1 g in dextrose 5 % 50 mL IVPB (not administered)  insulin aspart (novoLOG) injection 0-5 Units (not administered)  insulin aspart (novoLOG) injection 0-20 Units (not administered)  aspirin tablet 325 mg (not administered)  sodium chloride 0.9 % bolus 1,000 mL (0 mLs Intravenous Stopped 08/22/15 1100)  cefTRIAXone (ROCEPHIN) 1 g in dextrose 5 % 50 mL IVPB (0 g Intravenous Stopped 08/22/15 1030)     Initial Impression / Assessment and Plan / ED Course  I have reviewed the triage vital signs and the nursing notes.  Pertinent labs & imaging results that were available during my care of the patient were reviewed by me and considered in my medical decision making (see chart for details).  Clinical Course   Level V caveat for acute if condition. Patient from nursing home with slurred speech and left-sided weakness. States was normal at 6 AM per nursing home records.  Code stroke on arrival. CT scan shows no hemorrhage but does show subacute versus acute right ACA territory  infarct.  D/w neurology. Patient is not candidate for tPA or other intervention as stroke has been completed.  Daughter states she has not been normal for several days.  No tPA recommended by neurology.  Labs show leukoctyosis and renal failure.  Neurology consult with Dr. Dwyane Dee. He agrees with subacute right ACA CVA. Not a candidate for IV TPA due to completed CVA. Last seen normal several days ago. Recommends admission for stroke workup.   Daughter states patient unable to have MRI due to stapes wire.  Admission d/w Dr. Roderic Palau.  CRITICAL CARE Performed by: Ezequiel Essex Total critical care time: 32 minutes Critical care time was exclusive of separately billable procedures and treating other patients. Critical care was necessary to treat or prevent imminent or life-threatening deterioration. Critical care was time spent personally by me on the following activities: development of treatment plan with patient and/or surrogate as well as nursing, discussions with consultants, evaluation of patient's response to treatment, examination of patient, obtaining history from patient or surrogate, ordering and performing treatments and interventions, ordering and review of laboratory studies, ordering and review of radiographic studies, pulse oximetry and re-evaluation of patient's condition.    I personally performed the services described in this documentation, which was scribed in my presence. The recorded information has been reviewed and is accurate.   Final Clinical Impressions(s) / ED Diagnoses   Final diagnoses:  Cerebral infarction due to unspecified mechanism  Acute  renal failure, unspecified acute renal failure type [facility])  Urinary tract infection without hematuria, site unspecified    New Prescriptions Current Discharge Medication List       Ezequiel Essex, MD 08/22/15 1701

## 2015-08-22 NOTE — ED Notes (Signed)
Decision to not give TPA made at this time with EDP, neurologist and Daughter present at bedside.

## 2015-08-22 NOTE — Consult Note (Signed)
[place] A. Merlene Laughter, MD     [email or web address]          Lori Mahoney is an 70 y.o. female.   ASSESSMENT/PLAN: 1. 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 Risk factors age, hypertension and diabetes.   2. Medication-induced parkinsonism.  3. Cognitive impairment likely due to vascular disease with increased risk long term of vascular-induced dementia.   4. Multifactorial gait impairment including previous infarct, current infarct and osteoarthritis.  5. Abnormal movements involving the left hand of unclear etiology possibly due to parkinsonism but concerned about possible seizures.  6. Severe nausea vomiting due to severe gastroparesis.  7. Headaches.  8. Neck pain with some evidence of torticollis on the left side.      RECOMMENDATION: Agree with aspirin 325. Carotid duplex Doppler and echocardiography. EEG. Physical and occupational therapies  Speech therapy "Unable MRI done due to retained stapedal wiring"  The patient is a 70 year old white female who presents with left-sided weakness and dysarthria. She has difficulties moving around at baseline because of multiple issues including severe osteoarthritis, medication induced parkinsonism and apparently mild left-sided weakness due to previous infarct. She was presented to the hospital because of significant a worsening left-sided weakness and dysarthria. The patient has difficulties finding history. She reports that she has headaches. She does recognizes me from seeing her in the office and reports that her headaches are actually better but she now complains of significant neck pain. The patient does not complain of chest pain, shortness of breath or nausea vomiting this time. She does have intractable nausea vomiting in the past due to diabetic gastroparesis. I do question her repeatedly about worsening weakness or dysarthria but she seems  not to be able to corroborate this at this time. The review systems is somewhat limited given her obvious cognitive impairment but she seems not to have a complaints of this time.  GENERAL: Pleasant female who is in no acute distress. She appears to have some mild psychomotor slowing.  HEENT: Reduced range of motion to the left with moderate hypertrophy of the left sternomastoid muscle.  ABDOMEN: soft  EXTREMITIES: There is mild nonpitting edema of the feet and ankles. Marked arthritic changes of the knees. Status post bilateral knee arthroplasties. There is also arthritic changes of the hands.   BACK: Normal  SKIN: Normal by inspection.    MENTAL STATUS: She is awake and alert. She does have some difficulties following commands at times. There is moderate dysarthria. She knows she is in the hospital she thinks that she is at [facility]. She states her age and the month correctly. The patient did well with naming simple and 2 word objects.  CRANIAL NERVES: Pupils are equal, round and reactive to light and accomodation; extra ocular movements are full, there is no significant nystagmus; visual fields are full; upper and lower facial muscles are normal in strength and symmetric, there is no flattening of the nasolabial folds; tongue is midline; uvula is midline; shoulder elevation is normal.  MOTOR: Left hemiplegia. Right upper extremity graded as 4 minus/5. Right leg 3. Profound drift left upper extremity and left leg. Mild drift right upper extremity. Severe just right leg. Patient appears to have some neglect of the left side even over the profound weakness.  COORDINATION: Left finger to nose is normal, right finger to nose is normal, episodic twitching of the left hand. No dysmetria.  REFLEXES: Deep tendon reflexes are symmetrical and  normal - except the knees where there were absent likely due to surgery there were 2+ at the ankles.   SENSATION: Normal to light touch and  temperature. There is no extinction to tactile or visual double simultaneous stimulation.    NIH stroke scale 2, 2, 2, 2, 1, and 1. Total 10.     Blood pressure (!) 143/67, pulse 85, temperature 99.1 F (37.3 C), temperature source Oral, resp. rate 15, height 5' 3" (1.6 m), weight 182 lb 12.2 oz (82.9 kg), SpO2 100 %.  Past Medical History:  Diagnosis Date  . Anemia    borderline anemia   . Arthritis    hands, hips, back   . Cancer ([place])   . Complication of anesthesia   . Depression   . Diabetes mellitus   . Difficulty walking   . Disc degeneration, lumbar   . Gastroparesis   . GERD (gastroesophageal reflux disease)   . H/O exercise stress test    good result- 2003.  Also had Echocardiogram 10 yrs. ago, told that valve was thickening, but no need for changes , no need for f/u  . H/O hiatal hernia   . Headache   . Hyperlipidemia   . Hypertension   . Neuromuscular disorder (HCC)    neuropathy- legs & feet   . Neuropathy ([place])   . PONV (postoperative nausea and vomiting)   . Shortness of breath   . Spinal stenosis   . Stroke ([place])   . UTI (lower urinary tract infection)     Past Surgical History:  Procedure Laterality Date  . ABDOMINAL HYSTERECTOMY    . BACK SURGERY  02/2009   L3-S1 fusion  . COLONOSCOPY    . ESOPHAGOGASTRODUODENOSCOPY N/A 02/05/2015   Procedure: ESOPHAGOGASTRODUODENOSCOPY (EGD);  Surgeon: [name], MD;  Location: [facility];  Service: Endoscopy;  Laterality: N/A;  . EYE SURGERY     cataracts removed- R eye    . JOINT REPLACEMENT  2005   left  . KNEE SURGERY    . STAPEDECTOMY     bilateral, then a 2nd time to R ear  . UPPER GASTROINTESTINAL ENDOSCOPY      Family History  Problem Relation Age of Onset  . Colon cancer Mother     age 37  . Breast cancer Sister   . Diabetes Brother   . Arthritis    . Cancer Brother     prostate    Social History:  reports that she has never smoked. She has never used smokeless tobacco. She  reports that she does not drink alcohol or use drugs.  Allergies:  Allergies  Allergen Reactions  . Codeine Nausea And Vomiting  . Hydrocodone-Acetaminophen Nausea And Vomiting  . Meperidine Hcl Nausea And Vomiting  . Contrast Media [Iodinated Diagnostic Agents] Nausea And Vomiting  . Oxycodone Nausea And Vomiting  . Tetracyclines & Related Rash    Medications: Prior to Admission medications   Medication Sig Start Date End Date Taking? Authorizing Provider  acetaminophen (TYLENOL) 325 MG tablet Take 2 tablets (650 mg total) by mouth every 6 (six) hours as needed for mild pain, moderate pain, fever or headache. 02/06/15  Yes [name], MD  aspirin EC 81 MG tablet Take 81 mg by mouth daily.   Yes Historical Provider, MD  buPROPion (WELLBUTRIN XL) 150 MG 24 hr tablet Take 150 mg by mouth daily.   Yes Historical Provider, MD  cholecalciferol (VITAMIN D) 1000 units tablet Take 1,000 Units by mouth daily.  Yes Historical Provider, MD  docusate sodium (COLACE) 100 MG capsule Take 100 mg by mouth 2 (two) times daily.   Yes Historical Provider, MD  feeding supplement (ENSURE IMMUNE HEALTH) LIQD Take 237 mLs by mouth 2 (two) times daily.   Yes Historical Provider, MD  gabapentin (NEURONTIN) 600 MG tablet Take 1,200 mg by mouth at bedtime.   Yes Historical Provider, MD  guaiFENesin-dextromethorphan (ROBITUSSIN DM) 100-10 MG/5ML syrup Take 15 mLs by mouth every 6 (six) hours as needed for cough.   Yes Historical Provider, MD  insulin detemir (LEVEMIR) 100 UNIT/ML injection Inject 0.25 mLs (25 Units total) into the skin at bedtime. 02/06/15  Yes [name], MD  insulin lispro (HUMALOG KWIKPEN) 100 UNIT/ML KiwkPen Inject 3-20 Units into the skin 3 (three) times daily. Sliding Scale: <60--Call MD 121-150= 3 units 151-200= 4 units 201-250= 7 units 251-300=11units 301-350=15units 351-400=20units >400= Call MD   Yes Historical Provider, MD  insulin lispro (HUMALOG KWIKPEN) 100 UNIT/ML KiwkPen  Inject 2-5 Units into the skin at bedtime. Per sliding scale: If blood sugar is less than 60, call MD. 201-250= 2 units 251-300= 3 units 301-350= 4 units 351-400= 5 units >400= Call MD   Yes Historical Provider, MD  metoprolol (LOPRESSOR) 50 MG tablet Take 1 tablet (50 mg total) by mouth 2 (two) times daily. 02/06/15  Yes [name], MD  Multiple Vitamins-Minerals (CENTRUM SILVER PO) Take 1 tablet by mouth daily.   Yes Historical Provider, MD  ondansetron (ZOFRAN) 8 MG tablet Take 1 tablet (8 mg total) by mouth 4 (four) times daily -  before meals and at bedtime. Patient taking differently: Take 8 mg by mouth every 6 (six) hours.  02/06/15  Yes [name], MD  oxyCODONE-acetaminophen (PERCOCET/ROXICET) 5-325 MG tablet Take 1 tablet by mouth every 6 (six) hours as needed. Patient taking differently: Take 1 tablet by mouth every 6 (six) hours as needed for moderate pain.  11/30/14  Yes Milton Ferguson, MD  pantoprazole (PROTONIX) 40 MG tablet Take 1 tablet (40 mg total) by mouth 2 (two) times daily. 02/06/15  Yes [name], MD  polyethylene glycol [facility] / GLYCOLAX) packet Take 17 g by mouth daily.   Yes Historical Provider, MD  potassium chloride SA (K-DUR,KLOR-CON) 20 MEQ tablet Take 2 tablets (40 mEq total) by mouth daily. Patient taking differently: Take 20 mEq by mouth 2 (two) times daily.  02/06/15  Yes [name], MD  pravastatin (PRAVACHOL) 40 MG tablet Take 40 mg by mouth at bedtime.    Yes Historical Provider, MD  promethazine (PHENERGAN) 25 MG tablet Take 25 mg by mouth every 8 (eight) hours as needed for nausea or vomiting.    Yes Historical Provider, MD  topiramate (TOPAMAX) 25 MG tablet Take 25 mg by mouth 2 (two) times daily.    Yes Historical Provider, MD  verapamil (CALAN-SR) 240 MG CR tablet Take 240 mg by mouth daily with breakfast.  09/23/10  Yes Historical Provider, MD    Scheduled Meds: . sodium chloride   Intravenous STAT  . [START ON 08/23/2015] aspirin  325  mg Oral Daily  . buPROPion  150 mg Oral Daily  . [START ON 08/23/2015] cefTRIAXone (ROCEPHIN)  IV  1 g Intravenous Q24H  . cholecalciferol  1,000 Units Oral Daily  . docusate sodium  100 mg Oral BID  . enoxaparin (LOVENOX) injection  30 mg Subcutaneous Q24H  . feeding supplement (ENSURE ENLIVE)  237 mL Oral BID  . gabapentin  1,200 mg Oral QHS  .  insulin aspart  0-20 Units Subcutaneous TID WC  . insulin aspart  0-5 Units Subcutaneous QHS  . insulin detemir  25 Units Subcutaneous QHS  . metoprolol  50 mg Oral BID  . ondansetron  8 mg Oral Q6H  . pantoprazole  40 mg Oral BID  . polyethylene glycol  17 g Oral Daily  . potassium chloride SA  20 mEq Oral BID  . pravastatin  40 mg Oral QHS  . topiramate  25 mg Oral BID  . [START ON 08/23/2015] verapamil  240 mg Oral Q breakfast   Continuous Infusions: . sodium chloride 100 mL/hr at 08/22/15 1200   PRN Meds:.oxyCODONE-acetaminophen, senna-docusate     Results for orders placed or performed during the hospital encounter of 08/22/15 (from the past 48 hour(s))  Ethanol     Status: None   Collection Time: 08/22/15  8:11 AM  Result Value Ref Range   Alcohol, Ethyl (B) <5 <5 mg/dL    Comment:        LOWEST DETECTABLE LIMIT FOR SERUM ALCOHOL IS 5 mg/dL FOR MEDICAL PURPOSES ONLY   Protime-INR     Status: None   Collection Time: 08/22/15  8:11 AM  Result Value Ref Range   Prothrombin Time 14.2 11.4 - 15.2 seconds   INR 1.10   APTT     Status: None   Collection Time: 08/22/15  8:11 AM  Result Value Ref Range   aPTT 35 24 - 36 seconds  CBC     Status: Abnormal   Collection Time: 08/22/15  8:11 AM  Result Value Ref Range   WBC 21.5 (H) 4.0 - 10.5 K/uL   RBC 4.05 3.87 - 5.11 MIL/uL   Hemoglobin 12.1 12.0 - 15.0 g/dL   HCT 36.4 36.0 - 46.0 %   MCV 89.9 78.0 - 100.0 fL   MCH 29.9 26.0 - 34.0 pg   MCHC 33.2 30.0 - 36.0 g/dL   RDW 13.7 11.5 - 15.5 %   Platelets 179 150 - 400 K/uL  Differential     Status: Abnormal   Collection Time:  08/22/15  8:11 AM  Result Value Ref Range   Neutrophils Relative % 85 %   Neutro Abs 18.2 (H) 1.7 - 7.7 K/uL   Lymphocytes Relative 10 %   Lymphs Abs 2.1 0.7 - 4.0 K/uL   Monocytes Relative 5 %   Monocytes Absolute 1.1 (H) 0.1 - 1.0 K/uL   Eosinophils Relative 1 %   Eosinophils Absolute 0.1 0.0 - 0.7 K/uL   Basophils Relative 0 %   Basophils Absolute 0.0 0.0 - 0.1 K/uL  Comprehensive metabolic panel     Status: Abnormal   Collection Time: 08/22/15  8:11 AM  Result Value Ref Range   Sodium 136 135 - 145 mmol/L   Potassium 4.1 3.5 - 5.1 mmol/L   Chloride 105 101 - 111 mmol/L   CO2 24 22 - 32 mmol/L   Glucose, Bld 127 (H) 65 - 99 mg/dL   BUN 42 (H) 6 - 20 mg/dL   Creatinine, Ser 2.53 (H) 0.44 - 1.00 mg/dL   Calcium 9.1 8.9 - 10.3 mg/dL   Total Protein 6.7 6.5 - 8.1 g/dL   Albumin 3.2 (L) 3.5 - 5.0 g/dL   AST 24 15 - 41 U/L   ALT 25 14 - 54 U/L   Alkaline Phosphatase 78 38 - 126 U/L   Total Bilirubin 0.4 0.3 - 1.2 mg/dL   GFR calc non Af Amer 18 (L) >  60 mL/min   GFR calc Af Amer 21 (L) >60 mL/min    Comment: (NOTE) The eGFR has been calculated using the CKD EPI equation. This calculation has not been validated in all clinical situations. eGFR's persistently <60 mL/min signify possible Chronic Kidney Disease.    Anion gap 7 5 - 15  CBG monitoring, ED     Status: Abnormal   Collection Time: 08/22/15  8:13 AM  Result Value Ref Range   Glucose-Capillary 128 (H) 65 - 99 mg/dL  I-stat troponin, ED (not at [facility], [facility])     Status: None   Collection Time: 08/22/15  8:16 AM  Result Value Ref Range   Troponin i, poc 0.00 0.00 - 0.08 ng/mL   Comment 3            Comment: Due to the release kinetics of cTnI, a negative result within the first hours of the onset of symptoms does not rule out myocardial infarction with certainty. If myocardial infarction is still suspected, repeat the test at appropriate intervals.   I-Stat Chem 8, ED  (not at [facility], [facility])     Status: Abnormal    Collection Time: 08/22/15  8:18 AM  Result Value Ref Range   Sodium 138 135 - 145 mmol/L   Potassium 3.9 3.5 - 5.1 mmol/L   Chloride 101 101 - 111 mmol/L   BUN 38 (H) 6 - 20 mg/dL   Creatinine, Ser 2.60 (H) 0.44 - 1.00 mg/dL   Glucose, Bld 123 (H) 65 - 99 mg/dL   Calcium, Ion 1.26 (H) 1.12 - 1.23 mmol/L   TCO2 25 0 - 100 mmol/L   Hemoglobin 12.2 12.0 - 15.0 g/dL   HCT 36.0 36.0 - 46.0 %  Urine rapid drug screen (hosp performed)not at [facility]     Status: None   Collection Time: 08/22/15  9:00 AM  Result Value Ref Range   Opiates NONE DETECTED NONE DETECTED   Cocaine NONE DETECTED NONE DETECTED   Benzodiazepines NONE DETECTED NONE DETECTED   Amphetamines NONE DETECTED NONE DETECTED   Tetrahydrocannabinol NONE DETECTED NONE DETECTED   Barbiturates NONE DETECTED NONE DETECTED    Comment:        DRUG SCREEN FOR MEDICAL PURPOSES ONLY.  IF CONFIRMATION IS NEEDED FOR ANY PURPOSE, NOTIFY LAB WITHIN 5 DAYS.        LOWEST DETECTABLE LIMITS FOR URINE DRUG SCREEN Drug Class       Cutoff (ng/mL) Amphetamine      1000 Barbiturate      200 Benzodiazepine   025 Tricyclics       427 Opiates          300 Cocaine          300 THC              50   Urinalysis, Routine w reflex microscopic (not at [facility])     Status: Abnormal   Collection Time: 08/22/15  9:00 AM  Result Value Ref Range   Color, Urine YELLOW YELLOW   APPearance HAZY (A) CLEAR   Specific Gravity, Urine >1.030 (H) 1.005 - 1.030   pH 5.0 5.0 - 8.0   Glucose, UA NEGATIVE NEGATIVE mg/dL   Hgb urine dipstick NEGATIVE NEGATIVE   Bilirubin Urine NEGATIVE NEGATIVE   Ketones, ur NEGATIVE NEGATIVE mg/dL   Protein, ur NEGATIVE NEGATIVE mg/dL   Nitrite NEGATIVE NEGATIVE   Leukocytes, UA MODERATE (A) NEGATIVE  Urine microscopic-add on     Status: Abnormal  Collection Time: 08/22/15  9:00 AM  Result Value Ref Range   Squamous Epithelial / LPF TOO NUMEROUS TO COUNT (A) NONE SEEN   WBC, UA TOO NUMEROUS TO COUNT 0 - 5 WBC/hpf   RBC / HPF  NONE SEEN 0 - 5 RBC/hpf   Bacteria, UA MANY (A) NONE SEEN  Glucose, capillary     Status: Abnormal   Collection Time: 08/22/15  5:25 PM  Result Value Ref Range   Glucose-Capillary 126 (H) 65 - 99 mg/dL   Comment 1 Notify RN    Comment 2 Document in Chart     Studies/Results:  HEAD CT  There is a wedge shaped hypoattenuated area within the right frontal lobe, likely representing an acute infarction. A smaller area of hypoattenuation is seen in the posterior left frontal lobe, which may also represent an age-indeterminate infarct. No mass effect or midline shift. No evidence of acute intracranial hemorrhage. No abnormal extra-axial fluid collections. There is a stable moderate to severe brain parenchymal volume loss and advanced periventricular white matter disease. There may be prior bilateral basal ganglia lacunar infarcts. Basal cisterns are preserved. No depressed skull fractures. Visualized paranasal sinuses and mastoid air cells are not opacified. IMPRESSION: 1. Acute right frontal lobe infarct, likely within right ACA territory. No evidence of hemorrhagic transformation. 2. Probable small age-indeterminate left frontal infarct. 3. Advanced brain parenchymal atrophy, and chronic microvascular disease. 4. Probable bilateral basal ganglia old lacunar infarcts   HEAD CT 01-2015 Stable age related cerebral atrophy, ventriculomegaly and periventricular white matter disease. No extra-axial fluid collections are identified. No CT findings for acute hemispheric infarction or intracranial hemorrhage. No mass lesions. The brainstem and cerebellum are normal.  No acute bony findings. The paranasal sinuses and mastoid air cells are clear. The globes are intact.  IMPRESSION: Stable cerebral atrophy, ventriculomegaly and advanced periventricular white matter disease. No new/acute intracranial findings since prior recent study.     A. Merlene Laughter, M.D.  Diplomate, [employer] ( Neurology). 08/22/2015, 6:41 PM

## 2015-08-22 NOTE — ED Triage Notes (Signed)
Per Angelica Pou, [facility] staff, they noticed pt leaning to the right and having slurred speech at 0725 this morning.  Reports she was last seen normal at 6am when they gave her zofran.  Staff reports pt did not open her eyes to take her medication at 6 am but she did speak and her speech was clear at that time.  Staff reports last night at 9pm they were trying to give her medications and she "acted like she forgot how to swallow."  Reports she eventually swallowed her medication and was ok.  EDP notified and code stroke paged out.  Pt coming via ems.

## 2015-08-22 NOTE — ED Notes (Signed)
Report given to Rachael, RN for room 303.

## 2015-08-22 NOTE — ED Notes (Signed)
EDP assessment of patient at this time.

## 2015-08-22 NOTE — ED Notes (Addendum)
PT back to room from CT at 0824.

## 2015-08-22 NOTE — Evaluation (Signed)
Clinical/Bedside Swallow Evaluation Patient Details  Name: Lori Mahoney MRN: [id number] Date of Birth: 1945/02/20  Today's Date: 08/22/2015 Time: SLP Start Time (ACUTE ONLY): [id number] SLP Stop Time (ACUTE ONLY): 1432 SLP Time Calculation (min) (ACUTE ONLY): 47 min  Past Medical History:  Past Medical History:  Diagnosis Date  . Anemia    borderline anemia   . Arthritis    hands, hips, back   . Cancer ([place])   . Complication of anesthesia   . Depression   . Diabetes mellitus   . Difficulty walking   . Disc degeneration, lumbar   . Gastroparesis   . GERD (gastroesophageal reflux disease)   . H/O exercise stress test    good result- 2003.  Also had Echocardiogram 10 yrs. ago, told that valve was thickening, but no need for changes , no need for f/u  . H/O hiatal hernia   . Headache   . Hyperlipidemia   . Hypertension   . Neuromuscular disorder (HCC)    neuropathy- legs & feet   . Neuropathy ([place])   . PONV (postoperative nausea and vomiting)   . Shortness of breath   . Spinal stenosis   . Stroke ([place])   . UTI (lower urinary tract infection)    Past Surgical History:  Past Surgical History:  Procedure Laterality Date  . ABDOMINAL HYSTERECTOMY    . BACK SURGERY  02/2009   L3-S1 fusion  . COLONOSCOPY    . ESOPHAGOGASTRODUODENOSCOPY N/A 02/05/2015   Procedure: ESOPHAGOGASTRODUODENOSCOPY (EGD);  Surgeon: [name], MD;  Location: [facility];  Service: Endoscopy;  Laterality: N/A;  . EYE SURGERY     cataracts removed- R eye    . JOINT REPLACEMENT  2005   left  . KNEE SURGERY    . STAPEDECTOMY     bilateral, then a 2nd time to R ear  . UPPER GASTROINTESTINAL ENDOSCOPY     HPI:  Lori Mahoney a 70 y.o.femalewith medical history significant of DM, essential HTN, HLD, presents to the ED via EMS with complaints of worsening, moderate, left sided weakness and slurred speech. Per [facility] staff, patient appeared as though she forgot how to swallow last night while  receiving her medications. They also reported that shereceived Zofran at 6:00am this morning and appeared to be acting normal. The patient notes that she has chronic left sided weakness, present since prior stroke, but that her current weakness is worse than baseline. Reports back pain, which is a chronic issue, but has acutely worsened. CT head revealed an acute frontal lobe infarct likely within right ACA territory. Pt failed Stroke swallow screen and BSE will be completed.   Assessment / Plan / Recommendation Clinical Impression  Pt is known to SLP from SNF setting where she currently resides. Pt was provided oral care followed by an oral mechanism exam which was unremarkable with the exception of pt keeping her head turned to the right side and seeming to "neglect" her left side. She could however report number of fingers being held up on the left side. Orally/facially her ROM and strength was [facility]. Pt was administered ice chips, thin liquids, puree textures and regular all of which were consumed with no overt s/sx of aspiration. Pt did demonstrate some impulsivity therefore recommend 100% supervision for cues to eat slowly and for support for appropriate positioning during PO intake. Recommend regular/thin and meds to be crushed in puree. Pt presents with  worsened dysarthria (pt has mild dyarthria at baseline) and  daughter reports some episodes of confusion although overall cognitively daughter reports she seems to be at baseline. If pt does not experience spontaneous recovery for dysarthria and confusion episodes recommend evaluation/follow up from ST in SNF setting for further evaluation and management. Swallowing strategies and recommendation were reviewed with daughter and nurse, ST to sign off.     Aspiration Risk  Mild aspiration risk    Diet Recommendation Regular;Thin liquid   Liquid Administration via: Cup;Straw Medication Administration: Crushed with puree Supervision: Full  supervision/cueing for compensatory strategies Compensations: Slow rate;Small sips/bites;Minimize environmental distractions;Multiple dry swallows after each bite/sip;Follow solids with liquid Postural Changes: Seated upright at 90 degrees    Other  Recommendations Oral Care Recommendations: Oral care BID   Follow up Recommendations  Skilled Nursing facility           Prognosis Prognosis for Safe Diet Advancement: Good      Swallow Study   General Date of Onset: 08/22/15 HPI: Lori Mahoney a 70 y.o.femalewith medical history significant of DM, essential HTN, HLD, presents to the ED via EMS with complaints of worsening, moderate, left sided weakness and slurred speech. Per [facility] staff, patient appeared as though she forgot how to swallow last night while receiving her medications. They also reported that shereceived Zofran at 6:00am this morning and appeared to be acting normal. The patient notes that she has chronic left sided weakness, present since prior stroke, but that her current weakness is worse than baseline. Reports back pain, which is a chronic issue, but has acutely worsened. CT head revealed an acute frontal lobe infarct likely within right ACA territory. Pt failed Stroke swallow screen and BSE will be completed. Type of Study: Bedside Swallow Evaluation Previous Swallow Assessment: none Diet Prior to this Study: NPO Temperature Spikes Noted: No Respiratory Status: Room air History of Recent Intubation: No Behavior/Cognition: Alert;Cooperative;Pleasant mood Oral Cavity Assessment: Dry;Dried secretions Oral Care Completed by SLP: Yes Oral Cavity - Dentition: Adequate natural dentition Vision: Functional for self-feeding Self-Feeding Abilities: Able to feed self;Needs assist Patient Positioning: Upright in bed Baseline Vocal Quality: Normal Volitional Cough: Cognitively unable to elicit Volitional Swallow: Able to elicit    Oral/Motor/Sensory Function Overall  Oral Motor/Sensory Function: Within functional limits   Ice Chips Ice chips: Within functional limits   Thin Liquid Thin Liquid: Within functional limits    Nectar Thick Nectar Thick Liquid: Not tested   Honey Thick Honey Thick Liquid: Not tested   Puree Puree: Within functional limits   Solid   Lori Mahoney H. Roddie Mc, CCC-SLP Speech Language Pathologist  Solid: Within functional limits        Wende Bushy 08/22/2015,2:50 PM

## 2015-08-23 ENCOUNTER — Inpatient Hospital Stay (HOSPITAL_COMMUNITY): Payer: Medicare Other

## 2015-08-23 DIAGNOSIS — I6789 Other cerebrovascular disease: Secondary | ICD-10-CM

## 2015-08-23 LAB — GLUCOSE, CAPILLARY
GLUCOSE-CAPILLARY: 143 mg/dL — AB (ref 65–99)
Glucose-Capillary: 140 mg/dL — ABNORMAL HIGH (ref 65–99)
Glucose-Capillary: 171 mg/dL — ABNORMAL HIGH (ref 65–99)
Glucose-Capillary: 125 mg/dL — ABNORMAL HIGH (ref 65–99)

## 2015-08-23 LAB — ECHOCARDIOGRAM COMPLETE
CHL CUP MV DEC (S): 352
CHL CUP TV REG PEAK VELOCITY: 202 cm/s
E/e' ratio: 14.02
EWDT: 352 ms
FS: 36 % (ref 28–44)
HEIGHTINCHES: 63 in
IV/PV OW: 0.96
LA diam index: 2.1 cm/m2
LASIZE: 39 mm
LAVOL: 68.6 mL
LAVOLA4C: 63.1 mL
LAVOLIN: 36.9 mL/m2
LEFT ATRIUM END SYS DIAM: 39 mm
LV E/e' medial: 14.02
LV PW d: 9.72 mm — AB (ref 0.6–1.1)
LV TDI E'MEDIAL: 6.74
LV e' LATERAL: 6.64 cm/s
LVEEAVG: 14.02
LVOT area: 2.84 cm2
LVOTD: 19 mm
Lateral S' vel: 14.3 cm/s
MV Peak grad: 3 mmHg
MV pk E vel: 93.1 m/s
MVPKAVEL: 121 m/s
RV sys press: 19 mmHg
TAPSE: 17.1 mm
TDI e' lateral: 6.64
TR max vel: 202 cm/s
WEIGHTICAEL: 2924.18 [oz_av]

## 2015-08-23 LAB — COMPREHENSIVE METABOLIC PANEL
ALBUMIN: 2.8 g/dL — AB (ref 3.5–5.0)
ALK PHOS: 72 U/L (ref 38–126)
ALT: 24 U/L (ref 14–54)
AST: 28 U/L (ref 15–41)
Anion gap: 5 (ref 5–15)
BILIRUBIN TOTAL: 0.3 mg/dL (ref 0.3–1.2)
BUN: 31 mg/dL — AB (ref 6–20)
CALCIUM: 8.5 mg/dL — AB (ref 8.9–10.3)
CO2: 25 mmol/L (ref 22–32)
Chloride: 109 mmol/L (ref 101–111)
Creatinine, Ser: 1.48 mg/dL — ABNORMAL HIGH (ref 0.44–1.00)
GFR calc Af Amer: 40 mL/min — ABNORMAL LOW (ref >60)
GFR calc non Af Amer: 34 mL/min — ABNORMAL LOW (ref >60)
GLUCOSE: 108 mg/dL — AB (ref 65–99)
Potassium: 3.6 mmol/L (ref 3.5–5.1)
SODIUM: 139 mmol/L (ref 135–145)
TOTAL PROTEIN: 6.1 g/dL — AB (ref 6.5–8.1)

## 2015-08-23 LAB — LIPID PANEL
CHOLESTEROL: 116 mg/dL (ref 0–200)
HDL: 26 mg/dL — ABNORMAL LOW (ref >40)
LDL Cholesterol: 56 mg/dL (ref 0–99)
Total CHOL/HDL Ratio: 4.5 RATIO
Triglycerides: 172 mg/dL — ABNORMAL HIGH (ref <150)
VLDL: 34 mg/dL (ref 0–40)

## 2015-08-23 LAB — CBC
HCT: 33 % — ABNORMAL LOW (ref 36.0–46.0)
Hemoglobin: 11 g/dL — ABNORMAL LOW (ref 12.0–15.0)
MCH: 30.1 pg (ref 26.0–34.0)
MCHC: 33.3 g/dL (ref 30.0–36.0)
MCV: 90.2 fL (ref 78.0–100.0)
Platelets: 169 10*3/uL (ref 150–400)
RBC: 3.66 MIL/uL — ABNORMAL LOW (ref 3.87–5.11)
RDW: 13.6 % (ref 11.5–15.5)
WBC: 9.6 10*3/uL (ref 4.0–10.5)

## 2015-08-23 MED ORDER — ENOXAPARIN SODIUM 40 MG/0.4ML ~~LOC~~ SOLN
40.0000 mg | SUBCUTANEOUS | Status: DC
  Administered 2015-08-23 – 2015-08-26 (×4): 40 mg via SUBCUTANEOUS
  Filled 2015-08-23 (×4): qty 0.4

## 2015-08-23 NOTE — Progress Notes (Signed)
  Echocardiogram 2D Echocardiogram has been performed.  Lori Mahoney 08/23/2015, 2:50 PM

## 2015-08-23 NOTE — Care Management Important Message (Signed)
Important Message  Patient Details  Name: Lori Mahoney MRN: [id number] Date of Birth: Jul 14, 1945   Medicare Important Message Given:  Yes    Briant Sites, RN 08/23/2015, 12:01 PM

## 2015-08-23 NOTE — Progress Notes (Signed)
Subjective: She was admitted with another stroke. She had one several years ago. 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 She is about the same. She is able to communicate  Objective: Vital signs in last 24 hours: Temp:  [98 F (36.7 C)-99.2 F (37.3 C)] 98.3 F (36.8 C) (07/29 3810) Pulse Rate:  [71-85] 71 (07/29 0812) Resp:  [15-20] 18 (07/29 0812) BP: (116-146)/(54-71) 136/70 (07/29 0812) SpO2:  [94 %-100 %] 96 % (07/29 0812) Weight:  [82.9 kg (182 lb 12.2 oz)] 82.9 kg (182 lb 12.2 oz) (07/28 1158) Weight change:  Last BM Date:  (unknown and unable to assess)  Intake/Output from previous day: 07/28 0701 - 07/29 0700 In: 605 [I.V.:605] Out: -   PHYSICAL EXAM General appearance: alert and Speech somewhat slurred mildly confused Resp: clear to auscultation bilaterally Cardio: regular rate and rhythm, S1, S2 normal, no murmur, click, rub or gallop GI: soft, non-tender; bowel sounds normal; no masses,  no organomegaly Extremities: extremities normal, atraumatic, no cyanosis or edema She has neglect of the 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  Lab Results:  Results for orders placed or performed during the hospital encounter of 08/22/15 (from the past 48 hour(s))  Ethanol     Status: None   Collection Time: 08/22/15  8:11 AM  Result Value Ref Range   Alcohol, Ethyl (B) <5 <5 mg/dL    Comment:        LOWEST DETECTABLE LIMIT FOR SERUM ALCOHOL IS 5 mg/dL FOR MEDICAL PURPOSES ONLY   Protime-INR     Status: None   Collection Time: 08/22/15  8:11 AM  Result Value Ref Range   Prothrombin Time 14.2 11.4 - 15.2 seconds   INR 1.10   APTT     Status: None   Collection Time: 08/22/15  8:11 AM  Result Value Ref Range   aPTT 35 24 - 36 seconds  CBC     Status: Abnormal   Collection Time: 08/22/15  8:11 AM  Result Value Ref Range   WBC 21.5 (H) 4.0 - 10.5 K/uL   RBC 4.05 3.87 - 5.11 MIL/uL   Hemoglobin 12.1 12.0 - 15.0 g/dL   HCT 36.4 36.0 - 46.0  %   MCV 89.9 78.0 - 100.0 fL   MCH 29.9 26.0 - 34.0 pg   MCHC 33.2 30.0 - 36.0 g/dL   RDW 13.7 11.5 - 15.5 %   Platelets 179 150 - 400 K/uL  Differential     Status: Abnormal   Collection Time: 08/22/15  8:11 AM  Result Value Ref Range   Neutrophils Relative % 85 %   Neutro Abs 18.2 (H) 1.7 - 7.7 K/uL   Lymphocytes Relative 10 %   Lymphs Abs 2.1 0.7 - 4.0 K/uL   Monocytes Relative 5 %   Monocytes Absolute 1.1 (H) 0.1 - 1.0 K/uL   Eosinophils Relative 1 %   Eosinophils Absolute 0.1 0.0 - 0.7 K/uL   Basophils Relative 0 %   Basophils Absolute 0.0 0.0 - 0.1 K/uL  Comprehensive metabolic panel     Status: Abnormal   Collection Time: 08/22/15  8:11 AM  Result Value Ref Range   Sodium 136 135 - 145 mmol/L   Potassium 4.1 3.5 - 5.1 mmol/L   Chloride 105 101 - 111 mmol/L   CO2 24 22 - 32 mmol/L   Glucose, Bld 127 (H) 65 - 99 mg/dL   BUN 42 (H) 6 - 20 mg/dL   Creatinine,  Ser 2.53 (H) 0.44 - 1.00 mg/dL   Calcium 9.1 8.9 - 10.3 mg/dL   Total Protein 6.7 6.5 - 8.1 g/dL   Albumin 3.2 (L) 3.5 - 5.0 g/dL   AST 24 15 - 41 U/L   ALT 25 14 - 54 U/L   Alkaline Phosphatase 78 38 - 126 U/L   Total Bilirubin 0.4 0.3 - 1.2 mg/dL   GFR calc non Af Amer 18 (L) >60 mL/min   GFR calc Af Amer 21 (L) >60 mL/min    Comment: (NOTE) The eGFR has been calculated using the CKD EPI equation. This calculation has not been validated in all clinical situations. eGFR's persistently <60 mL/min signify possible Chronic Kidney Disease.    Anion gap 7 5 - 15  CBG monitoring, ED     Status: Abnormal   Collection Time: 08/22/15  8:13 AM  Result Value Ref Range   Glucose-Capillary 128 (H) 65 - 99 mg/dL  I-stat troponin, ED (not at [facility], [facility])     Status: None   Collection Time: 08/22/15  8:16 AM  Result Value Ref Range   Troponin i, poc 0.00 0.00 - 0.08 ng/mL   Comment 3            Comment: Due to the release kinetics of cTnI, a negative result within the first hours of the onset of symptoms does not rule  out myocardial infarction with certainty. If myocardial infarction is still suspected, repeat the test at appropriate intervals.   I-Stat Chem 8, ED  (not at [facility], [facility])     Status: Abnormal   Collection Time: 08/22/15  8:18 AM  Result Value Ref Range   Sodium 138 135 - 145 mmol/L   Potassium 3.9 3.5 - 5.1 mmol/L   Chloride 101 101 - 111 mmol/L   BUN 38 (H) 6 - 20 mg/dL   Creatinine, Ser 2.60 (H) 0.44 - 1.00 mg/dL   Glucose, Bld 123 (H) 65 - 99 mg/dL   Calcium, Ion 1.26 (H) 1.12 - 1.23 mmol/L   TCO2 25 0 - 100 mmol/L   Hemoglobin 12.2 12.0 - 15.0 g/dL   HCT 36.0 36.0 - 46.0 %  Urine rapid drug screen (hosp performed)not at [facility]     Status: None   Collection Time: 08/22/15  9:00 AM  Result Value Ref Range   Opiates NONE DETECTED NONE DETECTED   Cocaine NONE DETECTED NONE DETECTED   Benzodiazepines NONE DETECTED NONE DETECTED   Amphetamines NONE DETECTED NONE DETECTED   Tetrahydrocannabinol NONE DETECTED NONE DETECTED   Barbiturates NONE DETECTED NONE DETECTED    Comment:        DRUG SCREEN FOR MEDICAL PURPOSES ONLY.  IF CONFIRMATION IS NEEDED FOR ANY PURPOSE, NOTIFY LAB WITHIN 5 DAYS.        LOWEST DETECTABLE LIMITS FOR URINE DRUG SCREEN Drug Class       Cutoff (ng/mL) Amphetamine      1000 Barbiturate      200 Benzodiazepine   254 Tricyclics       270 Opiates          300 Cocaine          300 THC              50   Urinalysis, Routine w reflex microscopic (not at [facility])     Status: Abnormal   Collection Time: 08/22/15  9:00 AM  Result Value Ref Range   Color, Urine YELLOW YELLOW   APPearance HAZY (A)  CLEAR   Specific Gravity, Urine >1.030 (H) 1.005 - 1.030   pH 5.0 5.0 - 8.0   Glucose, UA NEGATIVE NEGATIVE mg/dL   Hgb urine dipstick NEGATIVE NEGATIVE   Bilirubin Urine NEGATIVE NEGATIVE   Ketones, ur NEGATIVE NEGATIVE mg/dL   Protein, ur NEGATIVE NEGATIVE mg/dL   Nitrite NEGATIVE NEGATIVE   Leukocytes, UA MODERATE (A) NEGATIVE  Urine microscopic-add on      Status: Abnormal   Collection Time: 08/22/15  9:00 AM  Result Value Ref Range   Squamous Epithelial / LPF TOO NUMEROUS TO COUNT (A) NONE SEEN   WBC, UA TOO NUMEROUS TO COUNT 0 - 5 WBC/hpf   RBC / HPF NONE SEEN 0 - 5 RBC/hpf   Bacteria, UA MANY (A) NONE SEEN  Glucose, capillary     Status: Abnormal   Collection Time: 08/22/15  5:25 PM  Result Value Ref Range   Glucose-Capillary 126 (H) 65 - 99 mg/dL   Comment 1 Notify RN    Comment 2 Document in Chart   Glucose, capillary     Status: Abnormal   Collection Time: 08/22/15  8:11 PM  Result Value Ref Range   Glucose-Capillary 154 (H) 65 - 99 mg/dL   Comment 1 Notify RN    Comment 2 Document in Chart   Lipid panel     Status: Abnormal   Collection Time: 08/23/15  5:51 AM  Result Value Ref Range   Cholesterol 116 0 - 200 mg/dL   Triglycerides 172 (H) <150 mg/dL   HDL 26 (L) >40 mg/dL   Total CHOL/HDL Ratio 4.5 RATIO   VLDL 34 0 - 40 mg/dL   LDL Cholesterol 56 0 - 99 mg/dL    Comment:        Total Cholesterol/HDL:CHD Risk Coronary Heart Disease Risk Table                     Men   Women  1/2 Average Risk   3.4   3.3  Average Risk       5.0   4.4  2 X Average Risk   9.6   7.1  3 X Average Risk  23.4   11.0        Use the calculated Patient Ratio above and the CHD Risk Table to determine the patient's CHD Risk.        ATP III CLASSIFICATION (LDL):  <100     mg/dL   Optimal  100-129  mg/dL   Near or Above                    Optimal  130-159  mg/dL   Borderline  160-189  mg/dL   High  >190     mg/dL   Very High   Comprehensive metabolic panel     Status: Abnormal   Collection Time: 08/23/15  5:51 AM  Result Value Ref Range   Sodium 139 135 - 145 mmol/L   Potassium 3.6 3.5 - 5.1 mmol/L   Chloride 109 101 - 111 mmol/L   CO2 25 22 - 32 mmol/L   Glucose, Bld 108 (H) 65 - 99 mg/dL   BUN 31 (H) 6 - 20 mg/dL   Creatinine, Ser 1.48 (H) 0.44 - 1.00 mg/dL    Comment: DELTA CHECK NOTED   Calcium 8.5 (L) 8.9 - 10.3 mg/dL   Total  Protein 6.1 (L) 6.5 - 8.1 g/dL   Albumin 2.8 (L) 3.5 - 5.0 g/dL  AST 28 15 - 41 U/L   ALT 24 14 - 54 U/L   Alkaline Phosphatase 72 38 - 126 U/L   Total Bilirubin 0.3 0.3 - 1.2 mg/dL   GFR calc non Af Amer 34 (L) >60 mL/min   GFR calc Af Amer 40 (L) >60 mL/min    Comment: (NOTE) The eGFR has been calculated using the CKD EPI equation. This calculation has not been validated in all clinical situations. eGFR's persistently <60 mL/min signify possible Chronic Kidney Disease.    Anion gap 5 5 - 15  CBC     Status: Abnormal   Collection Time: 08/23/15  5:51 AM  Result Value Ref Range   WBC 9.6 4.0 - 10.5 K/uL   RBC 3.66 (L) 3.87 - 5.11 MIL/uL   Hemoglobin 11.0 (L) 12.0 - 15.0 g/dL   HCT 33.0 (L) 36.0 - 46.0 %   MCV 90.2 78.0 - 100.0 fL   MCH 30.1 26.0 - 34.0 pg   MCHC 33.3 30.0 - 36.0 g/dL   RDW 13.6 11.5 - 15.5 %   Platelets 169 150 - 400 K/uL  Glucose, capillary     Status: Abnormal   Collection Time: 08/23/15  7:25 AM  Result Value Ref Range   Glucose-Capillary 140 (H) 65 - 99 mg/dL   Comment 1 Notify RN     ABGS  Recent Labs  08/22/15 0818  TCO2 25   CULTURES No results found for this or any previous visit (from the past 240 hour(s)). Studies/Results: Ct Head Code Stroke W/o Cm  Result Date: 08/22/2015 CLINICAL DATA:  Aphasia.  Left-sided weakness. EXAM: CT HEAD WITHOUT CONTRAST TECHNIQUE: Contiguous axial images were obtained from the base of the skull through the vertex without intravenous contrast. COMPARISON:  02/18/2015 FINDINGS: There is a wedge shaped hypoattenuated area within the right frontal lobe, likely representing an acute infarction. A smaller area of hypoattenuation is seen in the posterior left frontal lobe, which may also represent an age-indeterminate infarct. No mass effect or midline shift. No evidence of acute intracranial hemorrhage. No abnormal extra-axial fluid collections. There is a stable moderate to severe brain parenchymal volume loss and  advanced periventricular white matter disease. There may be prior bilateral basal ganglia lacunar infarcts. Basal cisterns are preserved. No depressed skull fractures. Visualized paranasal sinuses and mastoid air cells are not opacified. IMPRESSION: 1. Acute right frontal lobe infarct, likely within right ACA territory. No evidence of hemorrhagic transformation. 2. Probable small age-indeterminate left frontal infarct. 3. Advanced brain parenchymal atrophy, and chronic microvascular disease. 4. Probable bilateral basal ganglia old lacunar infarcts. Critical Value/emergent results were called by telephone at the time of interpretation on 08/22/2015 at 8:35 am to Dr. Ezequiel Essex , who verbally acknowledged these results. Electronically Signed   By: Fidela Salisbury M.D.   On: 08/22/2015 08:39   Medications:  Prior to Admission:  Prescriptions Prior to Admission  Medication Sig Dispense Refill Last Dose  . acetaminophen (TYLENOL) 325 MG tablet Take 2 tablets (650 mg total) by mouth every 6 (six) hours as needed for mild pain, moderate pain, fever or headache. 60 tablet 5 08/18/2015 at Unknown time  . aspirin EC 81 MG tablet Take 81 mg by mouth daily.   08/21/2015 at 0900  . buPROPion (WELLBUTRIN XL) 150 MG 24 hr tablet Take 150 mg by mouth daily.   08/21/2015 at Unknown time  . cholecalciferol (VITAMIN D) 1000 units tablet Take 1,000 Units by mouth daily.   08/21/2015 at Unknown  time  . docusate sodium (COLACE) 100 MG capsule Take 100 mg by mouth 2 (two) times daily.   08/21/2015 at Unknown time  . feeding supplement (ENSURE IMMUNE HEALTH) LIQD Take 237 mLs by mouth 2 (two) times daily.   08/21/2015 at Unknown time  . gabapentin (NEURONTIN) 600 MG tablet Take 1,200 mg by mouth at bedtime.   08/21/2015 at Unknown time  . guaiFENesin-dextromethorphan (ROBITUSSIN DM) 100-10 MG/5ML syrup Take 15 mLs by mouth every 6 (six) hours as needed for cough.   unknown  . insulin detemir (LEVEMIR) 100 UNIT/ML injection  Inject 0.25 mLs (25 Units total) into the skin at bedtime. 10 mL 11 08/21/2015 at Unknown time  . insulin lispro (HUMALOG KWIKPEN) 100 UNIT/ML KiwkPen Inject 3-20 Units into the skin 3 (three) times daily. Sliding Scale: <60--Call MD 121-150= 3 units 151-200= 4 units 201-250= 7 units 251-300=11units 301-350=15units 351-400=20units >400= Call MD   08/22/2015 at Unknown time  . insulin lispro (HUMALOG KWIKPEN) 100 UNIT/ML KiwkPen Inject 2-5 Units into the skin at bedtime. Per sliding scale: If blood sugar is less than 60, call MD. 201-250= 2 units 251-300= 3 units 301-350= 4 units 351-400= 5 units >400= Call MD   08/21/2015 at Unknown time  . metoprolol (LOPRESSOR) 50 MG tablet Take 1 tablet (50 mg total) by mouth 2 (two) times daily.   08/21/2015 at 0839  . Multiple Vitamins-Minerals (CENTRUM SILVER PO) Take 1 tablet by mouth daily.   08/21/2015 at Unknown time  . ondansetron (ZOFRAN) 8 MG tablet Take 1 tablet (8 mg total) by mouth 4 (four) times daily -  before meals and at bedtime. (Patient taking differently: Take 8 mg by mouth every 6 (six) hours. ) 20 tablet 0 08/22/2015 at 0546  . oxyCODONE-acetaminophen (PERCOCET/ROXICET) 5-325 MG tablet Take 1 tablet by mouth every 6 (six) hours as needed. (Patient taking differently: Take 1 tablet by mouth every 6 (six) hours as needed for moderate pain. ) 30 tablet 0 08/21/2015 at Unknown time  . pantoprazole (PROTONIX) 40 MG tablet Take 1 tablet (40 mg total) by mouth 2 (two) times daily. 60 tablet 12 08/21/2015 at Unknown time  . polyethylene glycol (MIRALAX / GLYCOLAX) packet Take 17 g by mouth daily.   08/21/2015 at Unknown time  . potassium chloride SA (K-DUR,KLOR-CON) 20 MEQ tablet Take 2 tablets (40 mEq total) by mouth daily. (Patient taking differently: Take 20 mEq by mouth 2 (two) times daily. )   08/21/2015 at Unknown time  . pravastatin (PRAVACHOL) 40 MG tablet Take 40 mg by mouth at bedtime.    08/21/2015 at Unknown time  . promethazine (PHENERGAN)  25 MG tablet Take 25 mg by mouth every 8 (eight) hours as needed for nausea or vomiting.    08/18/2015 at Unknown time  . topiramate (TOPAMAX) 25 MG tablet Take 25 mg by mouth 2 (two) times daily.    08/21/2015 at Unknown time  . verapamil (CALAN-SR) 240 MG CR tablet Take 240 mg by mouth daily with breakfast.    08/21/2015 at Unknown time  . [DISCONTINUED] lidocaine (LIDODERM) 5 % Place 1 patch onto the skin daily. Remove & Discard patch within 12 hours or as directed by MD (Patient not taking: Reported on 11/30/2014) 30 patch 1 Not Taking at Unknown time  . [DISCONTINUED] metoCLOPramide (REGLAN) 10 MG tablet Take 1 tablet (10 mg total) by mouth 4 (four) times daily -  before meals and at bedtime. (Patient not taking: Reported on 03/18/2015)   Not Taking  at Unknown time  . [DISCONTINUED] ondansetron (ZOFRAN) 4 MG tablet Take 1 tablet (4 mg total) by mouth every 6 (six) hours as needed for nausea. (Patient not taking: Reported on 08/22/2015) 20 tablet 0 Not Taking at Unknown time   Scheduled: . aspirin  325 mg Oral Daily  . buPROPion  150 mg Oral Daily  . cefTRIAXone (ROCEPHIN)  IV  1 g Intravenous Q24H  . cholecalciferol  1,000 Units Oral Daily  . docusate sodium  100 mg Oral BID  . enoxaparin (LOVENOX) injection  30 mg Subcutaneous Q24H  . feeding supplement (ENSURE ENLIVE)  237 mL Oral BID  . gabapentin  1,200 mg Oral QHS  . insulin aspart  0-20 Units Subcutaneous TID WC  . insulin aspart  0-5 Units Subcutaneous QHS  . insulin detemir  25 Units Subcutaneous QHS  . metoprolol  50 mg Oral BID  . ondansetron  8 mg Oral Q6H  . pantoprazole  40 mg Oral BID  . polyethylene glycol  17 g Oral Daily  . potassium chloride SA  20 mEq Oral BID  . pravastatin  40 mg Oral QHS  . topiramate  25 mg Oral BID  . verapamil  240 mg Oral Q breakfast   Continuous: . sodium chloride 100 mL/hr at 08/22/15 1200   MBB:[id number], senna-docusate  Assesment:She has had an acute stroke. She had acute  kidney injury which seems to be due to dehydration. At baseline she has severe diabetic gastroparesis with chronic nausea. She had a previous stroke several years ago. Her renal function is better. Her dehydration seems better. No change in her neurological examination. Active Problems:   Essential hypertension   UTI (lower urinary tract infection)   Dyslipidemia   Gastroparesis   Dehydration   CVA (cerebral infarction)   AKI (acute kidney injury) ([place])   Left-sided weakness   Stroke (cerebrum) ([place])    Plan: Discussed with her daughter. Discussed with patient as well. Continue with treatments. Discussed end-of-life issues and will request a palliative care consultation during this hospitalization.    LOS: 1 day   Keshonna Valvo L 08/23/2015, 10:02 AM

## 2015-08-23 NOTE — Evaluation (Signed)
Physical Therapy Evaluation Patient Details Name: Lori Mahoney MRN: [id number] DOB: 1946/01/23 Today's Date: 08/23/2015   History of Present Illness  70 yo F admitted 08/22/2015 with worsening L sided weakness and dysarthria Dx: acute R frontal cortical infarct.  PMH: anemia, arthritis, CA, depression, DM, disc degeneration lumbar, gastroparesis, hiatal hernia, HTN, neuropathy, spinal stenosis, CVA, UTI, L3-S1 fusion, L TKA.  Clinical Impression  Pt received in bed, dtr present, and pt is agreeable to PT evaluation.  Prior to admission pt required assistance for transfers, as well as ADL's over at the [facility].  Today during PT evaluation she demonstrates significant L sided hemiparesis, and required Max A for supine<>sit transfer.  Once sitting EOB she demonstrate significant LOB to the L due to hemiparesis and requires Max A to maintain static sitting balance.  Pt is recommended for SNF due to increased level of assistance required for mobility.     Follow Up Recommendations SNF    Equipment Recommendations  None recommended by PT    Recommendations for Other Services OT consult     Precautions / Restrictions Precautions Precautions: Fall Precaution Comments: due to immobility Restrictions Weight Bearing Restrictions: No      Mobility  Bed Mobility Overal bed mobility: Needs Assistance Bed Mobility: Supine to Sit;Sit to Supine     Supine to sit: Max assist;HOB elevated Sit to supine: Total assist;+2 for physical assistance   General bed mobility comments: Pt requires Max A for static sitting balance on the EOB due to strong L lean with L sided hemiparesis.  Pt able to assist holding herself in midline if using the bed rail on the right.  Total A for supine scoot for positioning in the bed.   Transfers Overall transfer level:  (NA)                  Ambulation/Gait                Stairs            Wheelchair Mobility    Modified Rankin  (Stroke Patients Only) Modified Rankin (Stroke Patients Only) Pre-Morbid Rankin Score: Severe disability Modified Rankin: Severe disability     Balance Overall balance assessment: Needs assistance Sitting-balance support: Single extremity supported;Feet supported (Right only due to L sided hemiparesis) Sitting balance-Leahy Scale: Poor   Postural control: Left lateral lean;Posterior lean                                   Pertinent Vitals/Pain Pain Assessment: No/denies pain    Home Living Family/patient expects to be discharged to:: Skilled nursing facility                      Prior Function     Gait / Transfers Assistance Needed: Used a sit<>stand lift.  Dtr states that pt has not ambulated since ~Jan 18th, 2017 when she had an "event" over at the [facility] where she was slumped to the left.  ADL's / Homemaking Assistance Needed: Pt was Max A for dressing/bathing  Comments: Jan - Feb with PT at [facility], and 2nd round of PT in May with no progression.  Dtr states she was able to stand for 10 seconds and using a sit<>stand lift.      Hand Dominance        Extremity/Trunk Assessment   Upper Extremity Assessment: LUE deficits/detail;RUE deficits/detail  RUE Deficits / Details: generalized weakness.      LUE Deficits / Details: Flaccid except some muscle twitching in the hand and forearm.  Flexor tone noted in the hand with PROM into shoulder flexion.   Lower Extremity Assessment: LLE deficits/detail;RLE deficits/detail RLE Deficits / Details: generalized weakness LLE Deficits / Details: Flaccid     Communication      Cognition Arousal/Alertness: Awake/alert Behavior During Therapy: WFL for tasks assessed/performed Overall Cognitive Status: Impaired/Different from baseline Area of Impairment: Orientation Orientation Level: Disoriented to;Place                  General Comments      Exercises        Assessment/Plan     PT Assessment Patient needs continued PT services  PT Diagnosis Generalized weakness;Hemiplegia non-dominant side   PT Problem List Decreased strength;Decreased activity tolerance;Decreased balance;Decreased mobility;Decreased coordination;Decreased knowledge of use of DME;Decreased safety awareness;Decreased knowledge of precautions;Obesity;Impaired tone  PT Treatment Interventions Functional mobility training;Therapeutic activities;Therapeutic exercise;Balance training;Neuromuscular re-education;Patient/family education   PT Goals (Current goals can be found in the Care Plan section) Acute Rehab PT Goals Patient Stated Goal: To be able to transfer again.  PT Goal Formulation: With patient/family Time For Goal Achievement: 09/06/15 Potential to Achieve Goals: Fair    Frequency Min 5X/week   Barriers to discharge        Co-evaluation               End of Session   Activity Tolerance: Patient tolerated treatment well Patient left: in bed;with call bell/phone within reach;with family/visitor present Nurse Communication: Need for lift equipment (hoyer lift)    Functional Assessment Tool Used: [employer] "6-clicks"  Functional Limitation: Mobility: Walking and moving around Mobility: Walking and Moving Around Current Status [phone]): At least 80 percent but less than 100 percent impaired, limited or restricted Mobility: Walking and Moving Around Goal Status [phone]): At least 60 percent but less than 80 percent impaired, limited or restricted    Time: 0917-0948 PT Time Calculation (min) (ACUTE ONLY): 31 min   Charges:   PT Evaluation $PT Eval Moderate Complexity: 1 Procedure PT Treatments $Therapeutic Activity: 8-22 mins   PT G Codes:   PT G-Codes **NOT FOR INPATIENT CLASS** Functional Assessment Tool Used: [employer] "6-clicks"  Functional Limitation: Mobility: Walking and moving around Mobility: Walking and Moving Around Current Status  [phone]): At least 80 percent but less than 100 percent impaired, limited or restricted Mobility: Walking and Moving Around Goal Status [phone]): At least 60 percent but less than 80 percent impaired, limited or restricted    [employer] 08/23/2015, 11:32 AM

## 2015-08-24 LAB — GLUCOSE, CAPILLARY
GLUCOSE-CAPILLARY: 173 mg/dL — AB (ref 65–99)
Glucose-Capillary: 129 mg/dL — ABNORMAL HIGH (ref 65–99)
Glucose-Capillary: 175 mg/dL — ABNORMAL HIGH (ref 65–99)
Glucose-Capillary: 151 mg/dL — ABNORMAL HIGH (ref 65–99)

## 2015-08-24 LAB — BASIC METABOLIC PANEL
ANION GAP: 3 — AB (ref 5–15)
BUN: 20 mg/dL (ref 6–20)
CALCIUM: 8.2 mg/dL — AB (ref 8.9–10.3)
CO2: 21 mmol/L — ABNORMAL LOW (ref 22–32)
Chloride: 114 mmol/L — ABNORMAL HIGH (ref 101–111)
Creatinine, Ser: 0.98 mg/dL (ref 0.44–1.00)
GFR, EST NON AFRICAN AMERICAN: 57 mL/min — AB (ref >60)
Glucose, Bld: 121 mg/dL — ABNORMAL HIGH (ref 65–99)
Potassium: 3.4 mmol/L — ABNORMAL LOW (ref 3.5–5.1)
SODIUM: 138 mmol/L (ref 135–145)

## 2015-08-24 LAB — URINE CULTURE

## 2015-08-24 MED ORDER — FLEET ENEMA 7-19 GM/118ML RE ENEM: 1.0000 | ENEMA | RECTAL | Status: DC | PRN

## 2015-08-24 MED ORDER — ACETAMINOPHEN 325 MG PO TABS
650.0000 mg | ORAL_TABLET | ORAL | Status: DC | PRN
  Administered 2015-08-24 – 2015-08-26 (×2): 650 mg via ORAL
  Filled 2015-08-24 (×2): qty 2

## 2015-08-24 MED ORDER — PROMETHAZINE HCL 12.5 MG PO TABS
12.5000 mg | ORAL_TABLET | Freq: Four times a day (QID) | ORAL | Status: DC | PRN
  Administered 2015-08-24: 12.5 mg via ORAL
  Filled 2015-08-24: qty 1

## 2015-08-24 NOTE — Progress Notes (Signed)
Pt complaining of a headache. Pt's family requested tylenol. MD paged/notified and verbal order for Tylenol 650 mg Q4 PRN. Pt has not had good bowel movement while here. Family requested to have enema on board as PRN. Senokot available.  MD gave verbal order for fleet enema PRN for severe constipation. RN spoke to family and gave senokot and if no relief, will do fleet enema. RN will continue to monitor. Oswald Hillock, RN

## 2015-08-24 NOTE — Progress Notes (Signed)
Subjective: She is about the same. She was confused last night I think at least partially because she's not in her normal environment. She has some movement of her left thumb but none of her left arm or left leg. Her speech is okay.  Objective: Vital signs in last 24 hours: Temp:  [98 F (36.7 C)-99 F (37.2 C)] 98 F (36.7 C) (07/30 0412) Pulse Rate:  [62-68] 68 (07/30 0826) Resp:  [18-20] 20 (07/30 0412) BP: (125-162)/(52-71) 158/65 (07/30 0826) SpO2:  [95 %-99 %] 98 % (07/30 0412) Weight change:  Last BM Date:  (unknown)  Intake/Output from previous day: 07/29 0701 - 07/30 0700 In: 3645 [P.O.:600; I.V.:2995; IV Piggyback:50] Out: -   PHYSICAL EXAM General appearance: alert, cooperative and With left hemiparesis Resp: clear to auscultation bilaterally Cardio: regular rate and rhythm, S1, S2 normal, no murmur, click, rub or gallop GI: soft, non-tender; bowel sounds normal; no masses,  no organomegaly Extremities: extremities normal, atraumatic, no cyanosis or edema  Lab Results:  Results for orders placed or performed during the hospital encounter of 08/22/15 (from the past 48 hour(s))  Glucose, capillary     Status: Abnormal   Collection Time: 08/22/15  5:25 PM  Result Value Ref Range   Glucose-Capillary 126 (H) 65 - 99 mg/dL   Comment 1 Notify RN    Comment 2 Document in Chart   Glucose, capillary     Status: Abnormal   Collection Time: 08/22/15  8:11 PM  Result Value Ref Range   Glucose-Capillary 154 (H) 65 - 99 mg/dL   Comment 1 Notify RN    Comment 2 Document in Chart   Lipid panel     Status: Abnormal   Collection Time: 08/23/15  5:51 AM  Result Value Ref Range   Cholesterol 116 0 - 200 mg/dL   Triglycerides 172 (H) <150 mg/dL   HDL 26 (L) >40 mg/dL   Total CHOL/HDL Ratio 4.5 RATIO   VLDL 34 0 - 40 mg/dL   LDL Cholesterol 56 0 - 99 mg/dL    Comment:        Total Cholesterol/HDL:CHD Risk Coronary Heart Disease Risk Table                     Men   Women   1/2 Average Risk   3.4   3.3  Average Risk       5.0   4.4  2 X Average Risk   9.6   7.1  3 X Average Risk  23.4   11.0        Use the calculated Patient Ratio above and the CHD Risk Table to determine the patient's CHD Risk.        ATP III CLASSIFICATION (LDL):  <100     mg/dL   Optimal  100-129  mg/dL   Near or Above                    Optimal  130-159  mg/dL   Borderline  160-189  mg/dL   High  >190     mg/dL   Very High   Comprehensive metabolic panel     Status: Abnormal   Collection Time: 08/23/15  5:51 AM  Result Value Ref Range   Sodium 139 135 - 145 mmol/L   Potassium 3.6 3.5 - 5.1 mmol/L   Chloride 109 101 - 111 mmol/L   CO2 25 22 - 32 mmol/L   Glucose, Bld 108 (H)  65 - 99 mg/dL   BUN 31 (H) 6 - 20 mg/dL   Creatinine, Ser 1.48 (H) 0.44 - 1.00 mg/dL    Comment: DELTA CHECK NOTED   Calcium 8.5 (L) 8.9 - 10.3 mg/dL   Total Protein 6.1 (L) 6.5 - 8.1 g/dL   Albumin 2.8 (L) 3.5 - 5.0 g/dL   AST 28 15 - 41 U/L   ALT 24 14 - 54 U/L   Alkaline Phosphatase 72 38 - 126 U/L   Total Bilirubin 0.3 0.3 - 1.2 mg/dL   GFR calc non Af Amer 34 (L) >60 mL/min   GFR calc Af Amer 40 (L) >60 mL/min    Comment: (NOTE) The eGFR has been calculated using the CKD EPI equation. This calculation has not been validated in all clinical situations. eGFR's persistently <60 mL/min signify possible Chronic Kidney Disease.    Anion gap 5 5 - 15  CBC     Status: Abnormal   Collection Time: 08/23/15  5:51 AM  Result Value Ref Range   WBC 9.6 4.0 - 10.5 K/uL   RBC 3.66 (L) 3.87 - 5.11 MIL/uL   Hemoglobin 11.0 (L) 12.0 - 15.0 g/dL   HCT 33.0 (L) 36.0 - 46.0 %   MCV 90.2 78.0 - 100.0 fL   MCH 30.1 26.0 - 34.0 pg   MCHC 33.3 30.0 - 36.0 g/dL   RDW 13.6 11.5 - 15.5 %   Platelets 169 150 - 400 K/uL  Glucose, capillary     Status: Abnormal   Collection Time: 08/23/15  7:25 AM  Result Value Ref Range   Glucose-Capillary 140 (H) 65 - 99 mg/dL   Comment 1 Notify RN   Glucose, capillary      Status: Abnormal   Collection Time: 08/23/15 11:21 AM  Result Value Ref Range   Glucose-Capillary 171 (H) 65 - 99 mg/dL   Comment 1 Notify RN   Glucose, capillary     Status: Abnormal   Collection Time: 08/23/15  4:31 PM  Result Value Ref Range   Glucose-Capillary 125 (H) 65 - 99 mg/dL   Comment 1 Notify RN   Glucose, capillary     Status: Abnormal   Collection Time: 08/23/15  9:39 PM  Result Value Ref Range   Glucose-Capillary 143 (H) 65 - 99 mg/dL   Comment 1 Notify RN    Comment 2 Document in Chart   Basic metabolic panel     Status: Abnormal   Collection Time: 08/24/15  5:49 AM  Result Value Ref Range   Sodium 138 135 - 145 mmol/L   Potassium 3.4 (L) 3.5 - 5.1 mmol/L   Chloride 114 (H) 101 - 111 mmol/L   CO2 21 (L) 22 - 32 mmol/L   Glucose, Bld 121 (H) 65 - 99 mg/dL   BUN 20 6 - 20 mg/dL   Creatinine, Ser 0.98 0.44 - 1.00 mg/dL   Calcium 8.2 (L) 8.9 - 10.3 mg/dL   GFR calc non Af Amer 57 (L) >60 mL/min   GFR calc Af Amer >60 >60 mL/min    Comment: (NOTE) The eGFR has been calculated using the CKD EPI equation. This calculation has not been validated in all clinical situations. eGFR's persistently <60 mL/min signify possible Chronic Kidney Disease.    Anion gap 3 (L) 5 - 15  Glucose, capillary     Status: Abnormal   Collection Time: 08/24/15  7:34 AM  Result Value Ref Range   Glucose-Capillary 129 (H) 65 - 99  mg/dL   Comment 1 Notify RN     ABGS  Recent Labs  08/22/15 0818  TCO2 25   CULTURES Recent Results (from the past 240 hour(s))  Urine culture     Status: Abnormal   Collection Time: 08/22/15  9:11 AM  Result Value Ref Range Status   Specimen Description URINE, CLEAN CATCH  Final   Special Requests NONE  Final   Culture 60,000 COLONIES/mL ESCHERICHIA COLI (A)  Final   Report Status 08/24/2015 FINAL  Final   Organism ID, Bacteria ESCHERICHIA COLI (A)  Final      Susceptibility   Escherichia coli - MIC*    AMPICILLIN >=32 RESISTANT Resistant      CEFAZOLIN <=4 SENSITIVE Sensitive     CEFTRIAXONE <=1 SENSITIVE Sensitive     CIPROFLOXACIN <=0.25 SENSITIVE Sensitive     GENTAMICIN <=1 SENSITIVE Sensitive     IMIPENEM <=0.25 SENSITIVE Sensitive     NITROFURANTOIN <=16 SENSITIVE Sensitive     TRIMETH/SULFA <=20 SENSITIVE Sensitive     AMPICILLIN/SULBACTAM 16 INTERMEDIATE Intermediate     PIP/TAZO <=4 SENSITIVE Sensitive     * 60,000 COLONIES/mL ESCHERICHIA COLI   Studies/Results: US Carotid Bilateral  Result Date: 08/23/2015 CLINICAL DATA:  Acute CVA with left-sided weakness, dysphagia and altered mental status. History of hypertension, hyperlipidemia and diabetes. EXAM: BILATERAL CAROTID DUPLEX ULTRASOUND TECHNIQUE: Pearline Cables scale imaging, color Doppler and duplex ultrasound were performed of bilateral carotid and vertebral arteries in the neck. COMPARISON:  Head CT -08/22/2015 FINDINGS: Criteria: Quantification of carotid stenosis is based on velocity parameters that correlate the residual internal carotid diameter with NASCET-based stenosis levels, using the diameter of the distal internal carotid lumen as the denominator for stenosis measurement. The following velocity measurements were obtained: RIGHT ICA:  87/17 cm/sec CCA:  38/25 cm/sec SYSTOLIC ICA/CCA RATIO:  1.2 DIASTOLIC ICA/CCA RATIO:  1.3 ECA:  118 cm/sec LEFT ICA:  98/28 cm/sec CCA:  05/39 cm/sec SYSTOLIC ICA/CCA RATIO:  1.1 DIASTOLIC ICA/CCA RATIO:  1.2 ECA:  146 cm/sec RIGHT CAROTID ARTERY: There is a minimal amount of scattered eccentric mixed echogenic plaque throughout the right common carotid artery. There is a minimal amount of eccentric mixed echogenic plaque involving the origin and proximal aspects of the right internal carotid artery (image 24), not resulting in elevated peak systolic velocities within the interrogated course the right internal carotid artery to suggest a hemodynamically significant stenosis. RIGHT VERTEBRAL ARTERY:  Antegrade Flow LEFT CAROTID ARTERY: There is  a minimal amount of intimal thickening throughout the left common carotid artery (representative image 45). There are no elevated peak systolic velocities within the interrogated course of the left internal carotid artery to suggest a hemodynamically significant stenosis. LEFT VERTEBRAL ARTERY:  Antegrade Flow IMPRESSION: Minimal amount of bilateral intimal thickening and atherosclerotic plaque, right greater than left, not resulting in a hemodynamically significant stenosis within either internal carotid artery. Electronically Signed   By: Sandi Mariscal M.D.   On: 08/23/2015 11:40   Medications:  Prior to Admission:  Prescriptions Prior to Admission  Medication Sig Dispense Refill Last Dose  . acetaminophen (TYLENOL) 325 MG tablet Take 2 tablets (650 mg total) by mouth every 6 (six) hours as needed for mild pain, moderate pain, fever or headache. 60 tablet 5 08/18/2015 at Unknown time  . aspirin EC 81 MG tablet Take 81 mg by mouth daily.   08/21/2015 at 0900  . buPROPion (WELLBUTRIN XL) 150 MG 24 hr tablet Take 150 mg by mouth daily.  08/21/2015 at Unknown time  . cholecalciferol (VITAMIN D) 1000 units tablet Take 1,000 Units by mouth daily.   08/21/2015 at Unknown time  . docusate sodium (COLACE) 100 MG capsule Take 100 mg by mouth 2 (two) times daily.   08/21/2015 at Unknown time  . feeding supplement (ENSURE IMMUNE HEALTH) LIQD Take 237 mLs by mouth 2 (two) times daily.   08/21/2015 at Unknown time  . gabapentin (NEURONTIN) 600 MG tablet Take 1,200 mg by mouth at bedtime.   08/21/2015 at Unknown time  . guaiFENesin-dextromethorphan (ROBITUSSIN DM) 100-10 MG/5ML syrup Take 15 mLs by mouth every 6 (six) hours as needed for cough.   unknown  . insulin detemir (LEVEMIR) 100 UNIT/ML injection Inject 0.25 mLs (25 Units total) into the skin at bedtime. 10 mL 11 08/21/2015 at Unknown time  . insulin lispro (HUMALOG KWIKPEN) 100 UNIT/ML KiwkPen Inject 3-20 Units into the skin 3 (three) times daily. Sliding  Scale: <60--Call MD 121-150= 3 units 151-200= 4 units 201-250= 7 units 251-300=11units 301-350=15units 351-400=20units >400= Call MD   08/22/2015 at Unknown time  . insulin lispro (HUMALOG KWIKPEN) 100 UNIT/ML KiwkPen Inject 2-5 Units into the skin at bedtime. Per sliding scale: If blood sugar is less than 60, call MD. 201-250= 2 units 251-300= 3 units 301-350= 4 units 351-400= 5 units >400= Call MD   08/21/2015 at Unknown time  . metoprolol (LOPRESSOR) 50 MG tablet Take 1 tablet (50 mg total) by mouth 2 (two) times daily.   08/21/2015 at 0839  . Multiple Vitamins-Minerals (CENTRUM SILVER PO) Take 1 tablet by mouth daily.   08/21/2015 at Unknown time  . ondansetron (ZOFRAN) 8 MG tablet Take 1 tablet (8 mg total) by mouth 4 (four) times daily -  before meals and at bedtime. (Patient taking differently: Take 8 mg by mouth every 6 (six) hours. ) 20 tablet 0 08/22/2015 at 0546  . oxyCODONE-acetaminophen (PERCOCET/ROXICET) 5-325 MG tablet Take 1 tablet by mouth every 6 (six) hours as needed. (Patient taking differently: Take 1 tablet by mouth every 6 (six) hours as needed for moderate pain. ) 30 tablet 0 08/21/2015 at Unknown time  . pantoprazole (PROTONIX) 40 MG tablet Take 1 tablet (40 mg total) by mouth 2 (two) times daily. 60 tablet 12 08/21/2015 at Unknown time  . polyethylene glycol (MIRALAX / GLYCOLAX) packet Take 17 g by mouth daily.   08/21/2015 at Unknown time  . potassium chloride SA (K-DUR,KLOR-CON) 20 MEQ tablet Take 2 tablets (40 mEq total) by mouth daily. (Patient taking differently: Take 20 mEq by mouth 2 (two) times daily. )   08/21/2015 at Unknown time  . pravastatin (PRAVACHOL) 40 MG tablet Take 40 mg by mouth at bedtime.    08/21/2015 at Unknown time  . promethazine (PHENERGAN) 25 MG tablet Take 25 mg by mouth every 8 (eight) hours as needed for nausea or vomiting.    08/18/2015 at Unknown time  . topiramate (TOPAMAX) 25 MG tablet Take 25 mg by mouth 2 (two) times daily.    08/21/2015 at  Unknown time  . verapamil (CALAN-SR) 240 MG CR tablet Take 240 mg by mouth daily with breakfast.    08/21/2015 at Unknown time  . [DISCONTINUED] lidocaine (LIDODERM) 5 % Place 1 patch onto the skin daily. Remove & Discard patch within 12 hours or as directed by MD (Patient not taking: Reported on 11/30/2014) 30 patch 1 Not Taking at Unknown time  . [DISCONTINUED] metoCLOPramide (REGLAN) 10 MG tablet Take 1 tablet (10 mg total)  by mouth 4 (four) times daily -  before meals and at bedtime. (Patient not taking: Reported on 03/18/2015)   Not Taking at Unknown time  . [DISCONTINUED] ondansetron (ZOFRAN) 4 MG tablet Take 1 tablet (4 mg total) by mouth every 6 (six) hours as needed for nausea. (Patient not taking: Reported on 08/22/2015) 20 tablet 0 Not Taking at Unknown time   Scheduled: . aspirin  325 mg Oral Daily  . buPROPion  150 mg Oral Daily  . cefTRIAXone (ROCEPHIN)  IV  1 g Intravenous Q24H  . cholecalciferol  1,000 Units Oral Daily  . docusate sodium  100 mg Oral BID  . enoxaparin (LOVENOX) injection  40 mg Subcutaneous Q24H  . feeding supplement (ENSURE ENLIVE)  237 mL Oral BID  . gabapentin  1,200 mg Oral QHS  . insulin aspart  0-20 Units Subcutaneous TID WC  . insulin aspart  0-5 Units Subcutaneous QHS  . insulin detemir  25 Units Subcutaneous QHS  . metoprolol  50 mg Oral BID  . ondansetron  8 mg Oral Q6H  . pantoprazole  40 mg Oral BID  . polyethylene glycol  17 g Oral Daily  . potassium chloride SA  20 mEq Oral BID  . pravastatin  40 mg Oral QHS  . topiramate  25 mg Oral BID  . verapamil  240 mg Oral Q breakfast   Continuous: . sodium chloride 100 mL/hr at 08/24/15 8887   NZV:[id number], promethazine, senna-docusate  Assesment:She was admitted with a stroke. She has left hemiparesis from this. She is being treated for urinary tract infection. She's had some confusion which I think is metabolic encephalopathy from her medical illnesses now. Her acute kidney injury is  much better. At baseline she has diabetes which is stable. Active Problems:   Essential hypertension   UTI (lower urinary tract infection)   Dyslipidemia   Gastroparesis   Dehydration   CVA (cerebral infarction)   AKI (acute kidney injury) ([place])   Left-sided weakness   Stroke (cerebrum) (HCC)    Plan: Continue PT OT and speech. Continue other treatments. I have requested palliative care consult to help family as they're struggling with goal setting    LOS: 2 days   Thetis Schwimmer L 08/24/2015, 9:49 AM

## 2015-08-25 ENCOUNTER — Encounter (HOSPITAL_COMMUNITY): Payer: Self-pay | Admitting: *Deleted

## 2015-08-25 ENCOUNTER — Inpatient Hospital Stay (HOSPITAL_COMMUNITY)
Admit: 2015-08-25 | Discharge: 2015-08-25 | Disposition: A | Discharge status: Home / Self Care | Payer: Medicare Other | Attending: Neurology | Admitting: Neurology

## 2015-08-25 DIAGNOSIS — I63239 Cerebral infarction due to unspecified occlusion or stenosis of unspecified carotid arteries: Secondary | ICD-10-CM

## 2015-08-25 DIAGNOSIS — Z515 Encounter for palliative care: Secondary | ICD-10-CM

## 2015-08-25 DIAGNOSIS — Z7189 Other specified counseling: Secondary | ICD-10-CM

## 2015-08-25 LAB — GLUCOSE, CAPILLARY
GLUCOSE-CAPILLARY: 162 mg/dL — AB (ref 65–99)
GLUCOSE-CAPILLARY: 125 mg/dL — AB (ref 65–99)
GLUCOSE-CAPILLARY: 187 mg/dL — AB (ref 65–99)
Glucose-Capillary: 119 mg/dL — ABNORMAL HIGH (ref 65–99)

## 2015-08-25 LAB — HEMOGLOBIN A1C
HEMOGLOBIN A1C: 7.3 % — AB (ref 4.8–5.6)
Mean Plasma Glucose: 163 mg/dL

## 2015-08-25 MED ORDER — GLUCERNA SHAKE PO LIQD
237.0000 mL | Freq: Three times a day (TID) | ORAL | Status: DC
  Administered 2015-08-25 – 2015-08-27 (×6): 237 mL via ORAL

## 2015-08-25 NOTE — Clinical Social Work Note (Signed)
Clinical Social Work Assessment  Patient Details  Name: Lori Mahoney MRN: [id number] Date of Birth: 06-12-1945  Date of referral:  08/25/15               Reason for consult:  Facility Placement                Permission sought to share information with:  Facility Art therapist granted to share information::  Yes, Verbal Permission Granted  Name::        Agency::  [place]  Relationship::     Contact Information:     Housing/Transportation Living arrangements for the past 2 months:  [place] of Information:  Adult Children Patient Interpreter Needed:  None Criminal Activity/Legal Involvement Pertinent to Current Situation/Hospitalization:  No - Comment as needed Significant Relationships:  Adult Children Lives with:  Facility Resident Do you feel safe going back to the place where you live?  Yes Need for family participation in patient care:  Yes (Comment)  Care giving concerns:  None reported. Pt is long term resident at [facility].    Social Worker assessment / plan:  CSW met with pt's daughters, Lori Mahoney and Lori Mahoney at bedside. Pt sleeping during assessment. They report that they visit pt daily at [facility] where she has been a resident for the past 6 months. Prior to January, pt was living independently. Pt came to hospital on Friday after staff at [facility] noted left sided weakness and slurred speech. Family share that pt has had a stroke. They said pt has been alert some this weekend and eating. At baseline, pt requires lift for transfers and is now nursing level of care at SNF as she was not making progress with PT. Per Tami at facility, okay for return.   Employment status:  Retired Nurse, adult PT Recommendations:  [place] / Referral to community resources:  Other (Comment Required) (return to [facility])  Patient/Family's Response to care:  Awaiting palliative care consult which family requested to  establish goals of care. Anticipate return to [facility].   Patient/Family's Understanding of and Emotional Response to Diagnosis, Current Treatment, and Prognosis:  Pt's daughters are very aware of medical history and admission diagnosis. They are tearful as they share that pt no longer has quality of life and pt has made comments about end of life recently. Brief support provided. Will follow up after palliative care consult as needed.   Emotional Assessment Appearance:  Appears stated age Attitude/Demeanor/Rapport:  Unable to Assess Affect (typically observed):  Unable to Assess Orientation:    Alcohol / Substance use:  Not Applicable Psych involvement (Current and /or in the community):  No (Comment)  Discharge Needs  Concerns to be addressed:  Discharge Planning Concerns Readmission within the last 30 days:  No Current discharge risk:  Physical Impairment Barriers to Discharge:  Continued Medical Work up   Salome Arnt, [place] 08/25/2015, 11:37 AM [phone]

## 2015-08-25 NOTE — Evaluation (Signed)
Occupational Therapy Evaluation Patient Details Name: Lori Mahoney MRN: [id number] DOB: 05/11/1945 Today's Date: 08/25/2015    History of Present Illness 70 yo F admitted 08/22/2015 with worsening L sided weakness and dysarthria Dx: acute R frontal cortical infarct.  PMH: anemia, arthritis, CA, depression, DM, disc degeneration lumbar, gastroparesis, hiatal hernia, HTN, neuropathy, spinal stenosis, CVA, UTI, L3-S1 fusion, L TKA.   Clinical Impression   Pt in bed when therapy arrived with daughter present. Patient currently eating breakfast although daughter reports that she has not be very interested in eating this morning. Patient agreeable to participate in OT evaluation. Pt will benefit from skilled OT services to increase sitting balance, visual perception, awareness, LUE strength, and overall endurance to increase functional performance during ADL and transfers allowing for family and caregivers to provide less physical assist. Pt will benefit from discharge to SNF.     Follow Up Recommendations  SNF    Equipment Recommendations  None recommended by OT       Precautions / Restrictions Precautions Precautions: Fall Precaution Comments: due to immobility Restrictions Weight Bearing Restrictions: No      Mobility Bed Mobility Overal bed mobility: Needs Assistance Bed Mobility: Rolling;Supine to Sit;Sit to Supine Rolling: Total assist;+2 for physical assistance   Supine to sit: Total assist;+2 for physical assistance Sit to supine: Total assist;+2 for physical assistance   General bed mobility comments: Pt required Max A for static sitting balance on EOB due to heavy lateral leant to left side due to left side hemiparesis.   Transfers Overall transfer level: Needs assistance                         ADL Overall ADL's : Needs assistance/impaired Eating/Feeding: Maximal assistance;Bed level               Upper Body Dressing : Total  assistance;Sitting Upper Body Dressing Details (indicate cue type and reason): With hospital gown change. Able to follow commands with RUE with verbal and tactile cueing.      Toilet Transfer: Total assistance;+2 for physical assistance;+2 for safety/equipment Toilet Transfer Details (indicate cue type and reason): From bed to recliner using hoyer lift         Functional mobility during ADLs: Total assistance;+2 for physical assistance;+2 for safety/equipment (hoyer lift) General ADL Comments: 2 person assist used for transfers with hoyer lift as well as bed mobility (rolling right and left, supine to sit, sit to supine).     Vision Vision Assessment?: Yes Ocular Range of Motion: Restricted on the left Alignment/Gaze Preference: Gaze right;Head tilt Tracking/Visual Pursuits: Decreased smoothness of horizontal tracking;Impaired - to be further tested in functional context Visual Fields: Impaired-to be further tested in functional context   Perception Perception Perception Tested?: Yes Perception Deficits: Inattention/neglect Inattention/Neglect: Does not attend to left visual field Spatial deficits: Decreased and seen during sitting on EOB. Pt with heavy lateral lean to the left. Unaware and required physical, tactile, and verbal cues to correct.       Pertinent Vitals/Pain Pain Assessment:  (Pt reports pain in left arm from bed mobility)     Hand Dominance Right   Extremity/Trunk Assessment Upper Extremity Assessment Upper Extremity Assessment: RUE deficits/detail;LUE deficits/detail RUE Deficits / Details: generalized weakness.  LUE Deficits / Details: LUE flaccid with exception of movement in thumb. Patient able to abduct and adduct thumb. No active digit movement. During grip assessment patient able to grip with more a lateral pinch using thumb  primarily. Flexor tone note in hand. P/ROM of shoulder, elbow and wrist is WFL. No pain reported during passive movement.  LUE  Sensation: decreased light touch;decreased proprioception LUE Coordination: decreased fine motor;decreased gross motor   Lower Extremity Assessment Lower Extremity Assessment: Defer to PT evaluation       Communication Communication Communication: No difficulties;HOH   Cognition Arousal/Alertness: Awake/alert Behavior During Therapy: Flat affect Overall Cognitive Status: Impaired/Different from baseline Area of Impairment: Orientation Orientation Level: Disoriented to;Place                    Exercises   Other Exercises Other Exercises: Education provided to daughter regarding LUE positioning when in bed and in chair. Pt with decreased attention to left side.         Home Living Family/patient expects to be discharged to:: Skilled nursing facility                                        Prior Functioning/Environment    Gait / Transfers Assistance Needed: Used a sit<>stand lift.  Dtr states that pt has not ambulated since ~Jan 18th, 2017 when she had an "event" over at the [facility] where she was slumped to the left. ADL's / Homemaking Assistance Needed: Pt was Max A for dressing/bathing   Comments: Jan - Feb with PT at [facility], and 2nd round of PT in May with no progression.  Dtr states she was able to stand for 10 seconds and using a sit<>stand lift.     OT Diagnosis: Generalized weakness;Disturbance of vision;Paresis;Hemiplegia non-dominant side   OT Problem List: Decreased strength;Decreased range of motion;Decreased coordination;Decreased cognition;Impaired sensation;Decreased safety awareness;Impaired tone;Impaired balance (sitting and/or standing);Decreased knowledge of use of DME or AE;Impaired UE functional use;Impaired vision/perception   OT Treatment/Interventions: Self-care/ADL training;Splinting;Therapeutic exercise;Therapeutic activities;Neuromuscular education;Cognitive remediation/compensation;Visual/perceptual  remediation/compensation;DME and/or AE instruction;Patient/family education;Balance training    OT Goals(Current goals can be found in the care plan section) Acute Rehab OT Goals Patient Stated Goal: None stated OT Goal Formulation: With patient Time For Goal Achievement: 09/08/15 Potential to Achieve Goals: Good  OT Frequency: Min 2X/week           Co-evaluation PT/OT/SLP Co-Evaluation/Treatment: Yes Reason for Co-Treatment: Complexity of the patient's impairments (multi-system involvement);For patient/therapist safety PT goals addressed during session: Mobility/safety with mobility;Balance;Strengthening/ROM OT goals addressed during session: Strengthening/ROM;ADL's and self-care      End of Session    Activity Tolerance: Patient tolerated treatment well Patient left: in chair;with call bell/phone within reach;with family/visitor present   Time: 0830-0910 OT Time Calculation (min): 40 min Charges:  OT General Charges $OT Visit: 1 Procedure OT Evaluation $OT Eval Moderate Complexity: 1 Procedure (15') OT Treatments $Self Care/Home Management : 23-37 mins (25') G-Codes:    09/18/2015, 9:26 AM Ailene Ravel, OTR/L,CBIS  [phone]

## 2015-08-25 NOTE — Consult Note (Signed)
Consultation Note Date: 08/25/2015   Patient Name: Lori Mahoney  DOB: 09-02-1945  MRN: [id number]  Age / Sex: 70 y.o., female  PCP: Lori Du, MD Referring Physician: [name], MD  Reason for Consultation: Disposition, Establishing goals of care and Psychosocial/spiritual support  HPI/Patient Profile: 70 y.o. female  with past medical history of Diabetes, gastroparesis, Gerd, DJD, headache, nausea and vomiting, history of UTIs, and stroke January 2017 admitted on 08/22/2015 with acute CVA.   Clinical Assessment and Goals of Care: [name] is resting quietly in bed. Family at bedside, includes daughters Lori Mahoney and Lori Mahoney, along with several grandchildren. She is able to make eye contact and speak to me. Daughters request that we go to my office for a conference.  Lori Mahoney, and I talk about [name]'s chronic health conditions, and her current health concern. We review labs and images.  Family shares that she has been wheelchair-bound since January 2017, and they have not seen much progression with physical therapy. They feel that she is getting worse, or at least no improvement. Daughter Lori Mahoney shares that her mother has been "flat" since this stroke, and she is unsure what her mother can understand about what has happened. She shares that she is concerned about her mothers safety, and risk for falling.  She also shares that her concerns over recurrent infections such as UTI, and now "bad teeth".  We talk about the chronic illness trajectory for being bedbound/immobile, including recurrent UTIs, skin breakdown, and pneumonia.   Lori Mahoney and Lori Mahoney also share they feel their mother had some Parkinson's type symptoms previous to her stroke in January, that they only recognized later. They feel that she may also be in the early stages of dementia. They talk about quality  of life being diminished. I share the chronic illness trajectory, and the concepts of do not treat the next infection do not rehospitalized. I share the MOST form. I reassure Lori Mahoney and Lori Mahoney that the palliative medicine team will continue to follow their family both here in the hospital and at [facility]. I share my worry that their mother may linger in this state. Lori Mahoney is tearful at times and shares that she feels guilty, but her desire is for her mother to not suffer, let nature take its course. Lori Mahoney states that she feels the same.   Health care power of atty HCPOA - daughters Lori Mahoney and Lori Mahoney are both HCPOA, and try to make choices together but can make choices independently is needed.    SUMMARY OF RECOMMENDATIONS   continue to treat the treatable at this time, but no extraordinary measures. No CPR, no intubation, no peg tube.  Family is considering options such as do not rehospitalize, do not treat the next infection, the MOST form. I will complete this form with family at their discretion.   Code Status/Advance Care Planning:  DNR  Symptom Management:   Per Dr. Luan Pulling  Palliative Prophylaxis:   Bowel Regimen, Oral Care and Turn Reposition  Additional Recommendations (  Limitations, Scope, Preferences):  Daughters are considering options such as do not re-hospitalize, do not treat the next infection.  Psycho-social/Spiritual:   Desire for further Chaplaincy support:no  Additional Recommendations: Caregiving  Support/Resources  Prognosis:   < 12 months, or less based on chronic illness burden related to him you please, frequent UTIs, immobility.  Discharge Planning: Daughter state goal is to return to [facility] for rehab, then residential stay.       Primary Diagnoses: Present on Admission: . CVA (cerebral infarction) . AKI (acute kidney injury) ([place]) . Essential hypertension . Dyslipidemia . Gastroparesis . Dehydration .  Left-sided weakness . UTI (lower urinary tract infection) . Stroke (cerebrum) ([place])   I have reviewed the medical record, interviewed the patient and family, and examined the patient. The following aspects are pertinent.  Past Medical History:  Diagnosis Date  . Anemia    borderline anemia   . Arthritis    hands, hips, back   . Cancer ([place])   . Complication of anesthesia   . Depression   . Diabetes mellitus   . Difficulty walking   . Disc degeneration, lumbar   . Gastroparesis   . GERD (gastroesophageal reflux disease)   . H/O exercise stress test    good result- 2003.  Also had Echocardiogram 10 yrs. ago, told that valve was thickening, but no need for changes , no need for f/u  . H/O hiatal hernia   . Headache   . Hyperlipidemia   . Hypertension   . Neuromuscular disorder (HCC)    neuropathy- legs & feet   . Neuropathy ([place])   . PONV (postoperative nausea and vomiting)   . Shortness of breath   . Spinal stenosis   . Stroke ([place])   . UTI (lower urinary tract infection)    Social History   Social History  . Marital status: Widowed    Spouse name: N/A  . Number of children: N/A  . Years of education: N/A   Occupational History  . retired Retired   Social History Main Topics  . Smoking status: Never Smoker  . Smokeless tobacco: Never Used  . Alcohol use No  . Drug use: No  . Sexual activity: Not Currently   Other Topics Concern  . None   Social History Narrative  . None   Family History  Problem Relation Age of Onset  . Colon cancer Mother     age 31  . Breast cancer Sister   . Diabetes Brother   . Arthritis    . Cancer Brother     prostate   Scheduled Meds: . aspirin  325 mg Oral Daily  . buPROPion  150 mg Oral Daily  . cefTRIAXone (ROCEPHIN)  IV  1 g Intravenous Q24H  . cholecalciferol  1,000 Units Oral Daily  . docusate sodium  100 mg Oral BID  . enoxaparin (LOVENOX) injection  40 mg Subcutaneous Q24H  . feeding supplement (ENSURE ENLIVE)   237 mL Oral BID  . feeding supplement (GLUCERNA SHAKE)  237 mL Oral TID BM  . gabapentin  1,200 mg Oral QHS  . insulin aspart  0-20 Units Subcutaneous TID WC  . insulin aspart  0-5 Units Subcutaneous QHS  . insulin detemir  25 Units Subcutaneous QHS  . metoprolol  50 mg Oral BID  . ondansetron  8 mg Oral Q6H  . pantoprazole  40 mg Oral BID  . polyethylene glycol  17 g Oral Daily  . potassium chloride SA  20 mEq Oral BID  . pravastatin  40 mg Oral QHS  . topiramate  25 mg Oral BID  . verapamil  240 mg Oral Q breakfast   Continuous Infusions: . sodium chloride 100 mL/hr at 08/24/15 1720   PRN Meds:.acetaminophen, oxyCODONE-acetaminophen, promethazine, senna-docusate, sodium phosphate Medications Prior to Admission:  Prior to Admission medications   Medication Sig Start Date End Date Taking? Authorizing Provider  acetaminophen (TYLENOL) 325 MG tablet Take 2 tablets (650 mg total) by mouth every 6 (six) hours as needed for mild pain, moderate pain, fever or headache. 02/06/15  Yes Lori Du, MD  aspirin EC 81 MG tablet Take 81 mg by mouth daily.   Yes Historical Provider, MD  buPROPion (WELLBUTRIN XL) 150 MG 24 hr tablet Take 150 mg by mouth daily.   Yes Historical Provider, MD  cholecalciferol (VITAMIN D) 1000 units tablet Take 1,000 Units by mouth daily.   Yes Historical Provider, MD  docusate sodium (COLACE) 100 MG capsule Take 100 mg by mouth 2 (two) times daily.   Yes Historical Provider, MD  feeding supplement (ENSURE IMMUNE HEALTH) LIQD Take 237 mLs by mouth 2 (two) times daily.   Yes Historical Provider, MD  gabapentin (NEURONTIN) 600 MG tablet Take 1,200 mg by mouth at bedtime.   Yes Historical Provider, MD  guaiFENesin-dextromethorphan (ROBITUSSIN DM) 100-10 MG/5ML syrup Take 15 mLs by mouth every 6 (six) hours as needed for cough.   Yes Historical Provider, MD  insulin detemir (LEVEMIR) 100 UNIT/ML injection Inject 0.25 mLs (25 Units total) into the skin at bedtime. 02/06/15   Yes Lori Du, MD  insulin lispro (HUMALOG KWIKPEN) 100 UNIT/ML KiwkPen Inject 3-20 Units into the skin 3 (three) times daily. Sliding Scale: <60--Call MD 121-150= 3 units 151-200= 4 units 201-250= 7 units 251-300=11units 301-350=15units 351-400=20units >400= Call MD   Yes Historical Provider, MD  insulin lispro (HUMALOG KWIKPEN) 100 UNIT/ML KiwkPen Inject 2-5 Units into the skin at bedtime. Per sliding scale: If blood sugar is less than 60, call MD. 201-250= 2 units 251-300= 3 units 301-350= 4 units 351-400= 5 units >400= Call MD   Yes Historical Provider, MD  metoprolol (LOPRESSOR) 50 MG tablet Take 1 tablet (50 mg total) by mouth 2 (two) times daily. 02/06/15  Yes Lori Du, MD  Multiple Vitamins-Minerals (CENTRUM SILVER PO) Take 1 tablet by mouth daily.   Yes Historical Provider, MD  ondansetron (ZOFRAN) 8 MG tablet Take 1 tablet (8 mg total) by mouth 4 (four) times daily -  before meals and at bedtime. Patient taking differently: Take 8 mg by mouth every 6 (six) hours.  02/06/15  Yes Lori Du, MD  oxyCODONE-acetaminophen (PERCOCET/ROXICET) 5-325 MG tablet Take 1 tablet by mouth every 6 (six) hours as needed. Patient taking differently: Take 1 tablet by mouth every 6 (six) hours as needed for moderate pain.  11/30/14  Yes Milton Ferguson, MD  pantoprazole (PROTONIX) 40 MG tablet Take 1 tablet (40 mg total) by mouth 2 (two) times daily. 02/06/15  Yes Lori Du, MD  polyethylene glycol [facility] / GLYCOLAX) packet Take 17 g by mouth daily.   Yes Historical Provider, MD  potassium chloride SA (K-DUR,KLOR-CON) 20 MEQ tablet Take 2 tablets (40 mEq total) by mouth daily. Patient taking differently: Take 20 mEq by mouth 2 (two) times daily.  02/06/15  Yes Lori Du, MD  pravastatin (PRAVACHOL) 40 MG tablet Take 40 mg by mouth at bedtime.    Yes Historical Provider, MD  promethazine (PHENERGAN) 25 MG tablet  Take 25 mg by mouth every 8 (eight) hours as needed for nausea or  vomiting.    Yes Historical Provider, MD  topiramate (TOPAMAX) 25 MG tablet Take 25 mg by mouth 2 (two) times daily.    Yes Historical Provider, MD  verapamil (CALAN-SR) 240 MG CR tablet Take 240 mg by mouth daily with breakfast.  09/23/10  Yes Historical Provider, MD   Allergies  Allergen Reactions  . Codeine Nausea And Vomiting  . Hydrocodone-Acetaminophen Nausea And Vomiting  . Meperidine Hcl Nausea And Vomiting  . Contrast Media [Iodinated Diagnostic Agents] Nausea And Vomiting  . Oxycodone Nausea And Vomiting  . Tetracyclines & Related Rash   Review of Systems  Unable to perform ROS: Mental status change    Physical Exam  Constitutional: No distress.  Chronically ill appearing, frail  HENT:  Head: Normocephalic and atraumatic.  Cardiovascular: Normal rate.   Pulmonary/Chest: Effort normal. No respiratory distress.  Abdominal: Soft. She exhibits no distension.  Skin: Skin is dry.  Nursing note and vitals reviewed.   Vital Signs: BP (!) 156/70 (BP Location: Right Arm)   Pulse (!) 55   Temp 98.5 F (36.9 C) (Oral)   Resp 16   Ht 5\' 3"  (1.6 m)   Wt 82.9 kg (182 lb 12.2 oz)   SpO2 97%   BMI 32.37 kg/m  Pain Assessment: No/denies pain   Pain Score: 7    SpO2: SpO2: 97 % O2 Device:SpO2: 97 % O2 Flow Rate: .   IO: Intake/output summary:  Intake/Output Summary (Last 24 hours) at 08/25/15 1355 Last data filed at 08/25/15 1100  Gross per 24 hour  Intake          2868.33 ml  Output              500 ml  Net          2368.33 ml    LBM: Last BM Date: 08/24/15 Baseline Weight: Weight: 81.2 kg (179 lb 1.6 oz) Most recent weight: Weight: 82.9 kg (182 lb 12.2 oz)     Palliative Assessment/Data:   Flowsheet Rows   Flowsheet Row Most Recent Value  Intake Tab  Referral Department  Pulmonary  Unit at Time of Referral  Med/Surg Unit  Palliative Care Primary Diagnosis  Neurology  Date Notified  08/23/15  Palliative Care Type  New Palliative care  Reason for referral   Clarify Goals of Care, Advance Care Planning  Date of Admission  08/22/15  Date first seen by Palliative Care  08/25/15  # of days Palliative referral response time  2 Day(s)  # of days IP prior to Palliative referral  1  Clinical Assessment  Palliative Performance Scale Score  20%  Pain Max last 24 hours  Not able to report  Pain Min Last 24 hours  Not able to report  Dyspnea Max Last 24 Hours  Not able to report  Dyspnea Min Last 24 hours  Not able to report  Psychosocial & Spiritual Assessment  Palliative Care Outcomes  Patient/Family meeting held?  Yes  Who was at the meeting?  daughters Lori Mahoney and Bryson  Provided advance care planning, Provided psychosocial or spiritual support, Clarified goals of care, Linked to palliative care logitudinal support  Patient/Family wishes: Interventions discontinued/not started   Mechanical Ventilation, Tube feedings/TPN, PEG  Palliative Care follow-up planned  -- [follow up at [facility] and [facility]]      Time In: 1215 Time Out: 1330 Time Total: 75 minutes  Greater than 50%  of this time was spent counseling and coordinating care related to the above assessment and plan.  Signed by: Drue Novel, NP   Please contact Palliative Medicine Team phone at [phone] for questions and concerns.  For individual provider: See Shea Evans

## 2015-08-25 NOTE — Progress Notes (Signed)
Physical Therapy Treatment Patient Details Name: Belmira C Martinique MRN: [id number] DOB: 06/06/1945 Today's Date: 08/25/2015    History of Present Illness 70 yo F admitted 08/22/2015 with worsening L sided weakness and dysarthria Dx: acute R frontal cortical infarct.  PMH: anemia, arthritis, CA, depression, DM, disc degeneration lumbar, gastroparesis, hiatal hernia, HTN, neuropathy, spinal stenosis, CVA, UTI, L3-S1 fusion, L TKA.    PT Comments    Pt received in bed, dtr present, and pt is agreeable to PT tx.  Pt continues to demonstrate dense L sided hemiparesis, and requires max/Total A +2 for all levels of functional mobility.  Pt demonstrates posterior and left lateral lean when sitting on the EOB.  At the end of the tx, pt was assisted from the bed<>chair via maxi move.  Continue to recommend SNF.   Follow Up Recommendations  SNF     Equipment Recommendations  None recommended by PT    Recommendations for Other Services OT consult     Precautions / Restrictions Precautions Precautions: Fall Precaution Comments: due to immobility Restrictions Weight Bearing Restrictions: No    Mobility  Bed Mobility Overal bed mobility: Needs Assistance Bed Mobility: Rolling;Supine to Sit;Sit to Supine Rolling: Total assist;+2 for physical assistance   Supine to sit: Total assist;+2 for physical assistance Sit to supine: Total assist;+2 for physical assistance   General bed mobility comments: Pt required Max A for static sitting balance on EOB due to heavy lateral leant to left side due to left side hemiparesis.   Transfers Overall transfer level: Needs assistance                  Ambulation/Gait                 Stairs            Wheelchair Mobility    Modified Rankin (Stroke Patients Only)       Balance Overall balance assessment: Needs assistance   Sitting balance-Leahy Scale: Poor Sitting balance - Comments: Pt required Max A for static sitting balance  on the EOB due to posterior and L lateral lean with L sided hemiparesis.  Pt holding on to R bedrail. Postural control: Posterior lean;Left lateral lean                          Cognition Arousal/Alertness: Awake/alert Behavior During Therapy: Flat affect Overall Cognitive Status: Impaired/Different from baseline Area of Impairment: Orientation Orientation Level: Set designer Exercises - Lower Extremity Long Arc [employer];Both;Limitations Long [employer] Limitations: Pt requires Max A +2 for static sitting balance with this exercise.  R LE limited due to generalized weakness, and L limited due to hemiparesis.  Pt assisted with crossing R under the left to perform AAROM on the left.   Shoulder Exercises Shoulder Flexion: AAROM;Seated;Left;Other (comment) (L sided hemiparesis, and R sided generalized weakness.  Only able to raise to ~90* of shoulder flexion.) Other Exercises Other Exercises: Education provided to daughter regarding LUE positioning when in bed and in chair. Pt with decreased attention to left side.     General Comments        Pertinent Vitals/Pain Pain Assessment:  (Pt with c/o L UE pain)    Home Living Family/patient expects to be discharged to:: Skilled nursing facility  Prior Function    Gait / Transfers Assistance Needed: Used a sit<>stand lift.  Dtr states that pt has not ambulated since ~Jan 18th, 2017 when she had an "event" over at the [facility] where she was slumped to the left. ADL's / Homemaking Assistance Needed: Pt was Max A for dressing/bathing Comments: Jan - Feb with PT at [facility], and 2nd round of PT in May with no progression.  Dtr states she was able to stand for 10 seconds and using a sit<>stand lift.    PT Goals (current goals can now be found in the care plan section) Acute Rehab PT Goals Patient Stated Goal: None stated PT Goal Formulation: With  patient/family Time For Goal Achievement: 09/06/15 Potential to Achieve Goals: Fair Progress towards PT goals: Progressing toward goals    Frequency  Min 5X/week    PT Plan Current plan remains appropriate    Co-evaluation PT/OT/SLP Co-Evaluation/Treatment: Yes Reason for Co-Treatment: Complexity of the patient's impairments (multi-system involvement) PT goals addressed during session: Mobility/safety with mobility;Balance;Strengthening/ROM OT goals addressed during session: Strengthening/ROM;ADL's and self-care     End of Session   Activity Tolerance: Patient tolerated treatment well Patient left: in chair;with call bell/phone within reach;with family/visitor present     Time: 0830-0910 PT Time Calculation (min) (ACUTE ONLY): 40 min  Charges:  $Therapeutic Activity: 23-37 mins                    G Codes:      Beth Modene Andy, PT, DPT X: [phone]

## 2015-08-25 NOTE — Care Management Note (Signed)
Case Management Note  Patient Details  Name: Lori Mahoney MRN: [id number] Date of Birth: 26-Apr-1945  Subjective/Objective:                  Pt admitted with CVA. Pt is from [facility] where she resides as LTR. PT has recommended SNF. Family agreeable for return to facility with STR. CSW is aware and will make arrangements for return to facility.  Action/Plan: No CM needs anticipated.   Expected Discharge Date:    08/26/2015              Expected Discharge Plan:  Skilled Nursing Facility  In-House Referral:  Clinical Social Work  Discharge planning Services  CM Consult  Post Acute Care Choice:  NA Choice offered to:  NA  DME Arranged:    DME Agency:     HH Arranged:    [employer] [place] Agency:     Status of Service:  Completed, signed off  If discussed at [employer], dates discussed:    Additional Comments:  Sherald Barge, RN 08/25/2015, 2:11 PM

## 2015-08-25 NOTE — Progress Notes (Signed)
Lavina A. Merlene Laughter, MD     [email or web address]          Lori Mahoney is an 70 y.o. female.   Assessment/Plan: 1. 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 Risk factors age, hypertension and diabetes.   2. Medication-induced parkinsonism.  3. Cognitive impairment likely due to vascular disease with increased risk long term of vascular-induced dementia.   4. Multifactorial gait impairment including previous infarct, current infarct and osteoarthritis.  5. Abnormal movements involving the left hand of unclear etiology possibly due to parkinsonism but concerned about possible seizures.  6. Severe nausea vomiting due to severe gastroparesis.  7. Headaches.  8. Neck pain with some evidence of torticollis on the left side.     The family reports that she is more responsive today. She has much less headaches with the Topamax. It appears that the headaches seem to trigger her nausea vomiting quite a bit although she does have a baseline gastroparesis. She continues to have severe weakness on the left side.   RECOMMENDATION: Agree with aspirin 325. FU EEG. Physical and occupational therapies  Speech therapy   GENERAL: Pleasant female who is in no acute distress. She appears to have some mild psychomotor slowing.  HEENT: Reduced range of motion to the left with moderate hypertrophy of the left sternomastoid muscle.  ABDOMEN: soft  EXTREMITIES: There is mild nonpitting edema of the feet and ankles. Marked arthritic changes of the knees. Status post bilateral knee arthroplasties. There is also arthritic changes of the hands.   BACK: Normal  SKIN: Normal by inspection.    MENTAL STATUS: She is awake and alert. She does have some difficulties following commands at times. There is moderate dysarthria. She knows she is in the hospital she thinks that she is at [facility].  She states her age and the month correctly. The patient did well with naming simple and 2 word objects.  CRANIAL NERVES: Pupils are equal, round and reactive to light and accomodation; extra ocular movements are full, there is no significant nystagmus; visual fields are full; upper and lower facial muscles are normal in strength and symmetric, there is no flattening of the nasolabial folds; tongue is midline; uvula is midline; shoulder elevation is normal.  MOTOR: Left hemiplegia. Right upper extremity graded as 4 minus/5. Right leg 3. Profound drift left upper extremity and left leg. Mild drift right upper extremity. Severe just right leg. Patient appears to have some neglect of the left side even over the profound weakness.  COORDINATION: Left finger to nose is normal, right finger to nose is normal, episodic twitching of the left hand. No dysmetria.      TTE  - Left ventricle: The cavity size was normal. Systolic function was   normal. Wall motion was normal; there were no regional wall   motion abnormalities. Doppler parameters are consistent with   abnormal left ventricular relaxation (grade 1 diastolic   dysfunction). - Mitral valve: There was mild regurgitation. - Left atrium: The atrium was mildly dilated. - Pericardium, extracardiac: A trivial pericardial effusion was   identified.  CAROTID DOPPLERS normal    Objective: Vital signs in last 24 hours: Temp:  [97.6 F (36.4 C)-98.5 F (36.9 C)] 98 F (36.7 C) (07/31 1612) Pulse Rate:  [55-72] 59 (07/31 1612) Resp:  [16-20] 17 (07/31 1612) BP: (145-169)/(67-77) 157/68 (07/31 1612) SpO2:  [97 %-100 %] 98 % (07/31 1612)  Intake/Output from previous day:  07/30 0701 - 07/31 0700 In: 2388.3 [I.V.:2338.3; IV Piggyback:50] Out: 500 [Urine:500] Intake/Output this shift: No intake/output data recorded. Nutritional status: Diet heart healthy/carb modified Room service appropriate? Yes; Fluid consistency: Thin   Lab  Results: Results for orders placed or performed during the hospital encounter of 08/22/15 (from the past 48 hour(s))  Glucose, capillary     Status: Abnormal   Collection Time: 08/23/15  9:39 PM  Result Value Ref Range   Glucose-Capillary 143 (H) 65 - 99 mg/dL   Comment 1 Notify RN    Comment 2 Document in Chart   Basic metabolic panel     Status: Abnormal   Collection Time: 08/24/15  5:49 AM  Result Value Ref Range   Sodium 138 135 - 145 mmol/L   Potassium 3.4 (L) 3.5 - 5.1 mmol/L   Chloride 114 (H) 101 - 111 mmol/L   CO2 21 (L) 22 - 32 mmol/L   Glucose, Bld 121 (H) 65 - 99 mg/dL   BUN 20 6 - 20 mg/dL   Creatinine, Ser 0.98 0.44 - 1.00 mg/dL   Calcium 8.2 (L) 8.9 - 10.3 mg/dL   GFR calc non Af Amer 57 (L) >60 mL/min   GFR calc Af Amer >60 >60 mL/min    Comment: (NOTE) The eGFR has been calculated using the CKD EPI equation. This calculation has not been validated in all clinical situations. eGFR's persistently <60 mL/min signify possible Chronic Kidney Disease.    Anion gap 3 (L) 5 - 15  Glucose, capillary     Status: Abnormal   Collection Time: 08/24/15  7:34 AM  Result Value Ref Range   Glucose-Capillary 129 (H) 65 - 99 mg/dL   Comment 1 Notify RN   Glucose, capillary     Status: Abnormal   Collection Time: 08/24/15 11:22 AM  Result Value Ref Range   Glucose-Capillary 173 (H) 65 - 99 mg/dL   Comment 1 Notify RN   Glucose, capillary     Status: Abnormal   Collection Time: 08/24/15  4:14 PM  Result Value Ref Range   Glucose-Capillary 175 (H) 65 - 99 mg/dL   Comment 1 Notify RN    Comment 2 Document in Chart   Glucose, capillary     Status: Abnormal   Collection Time: 08/24/15  8:23 PM  Result Value Ref Range   Glucose-Capillary 151 (H) 65 - 99 mg/dL  Glucose, capillary     Status: Abnormal   Collection Time: 08/25/15  7:33 AM  Result Value Ref Range   Glucose-Capillary 162 (H) 65 - 99 mg/dL  Glucose, capillary     Status: Abnormal   Collection Time: 08/25/15  11:34 AM  Result Value Ref Range   Glucose-Capillary 125 (H) 65 - 99 mg/dL  Glucose, capillary     Status: Abnormal   Collection Time: 08/25/15  4:37 PM  Result Value Ref Range   Glucose-Capillary 187 (H) 65 - 99 mg/dL    Lipid Panel  Recent Labs  08/23/15 0551  CHOL 116  TRIG 172*  HDL 26*  CHOLHDL 4.5  VLDL 34  LDLCALC 56    Studies/Results:   Medications:  Scheduled Meds: . aspirin  325 mg Oral Daily  . buPROPion  150 mg Oral Daily  . cefTRIAXone (ROCEPHIN)  IV  1 g Intravenous Q24H  . cholecalciferol  1,000 Units Oral Daily  . docusate sodium  100 mg Oral BID  . enoxaparin (LOVENOX) injection  40 mg Subcutaneous Q24H  . feeding supplement (  ENSURE ENLIVE)  237 mL Oral BID  . feeding supplement (GLUCERNA SHAKE)  237 mL Oral TID BM  . gabapentin  1,200 mg Oral QHS  . insulin aspart  0-20 Units Subcutaneous TID WC  . insulin aspart  0-5 Units Subcutaneous QHS  . insulin detemir  25 Units Subcutaneous QHS  . metoprolol  50 mg Oral BID  . ondansetron  8 mg Oral Q6H  . pantoprazole  40 mg Oral BID  . polyethylene glycol  17 g Oral Daily  . potassium chloride SA  20 mEq Oral BID  . pravastatin  40 mg Oral QHS  . topiramate  25 mg Oral BID  . verapamil  240 mg Oral Q breakfast   Continuous Infusions: . sodium chloride 100 mL/hr at 08/24/15 1720   PRN Meds:.acetaminophen, oxyCODONE-acetaminophen, promethazine, senna-docusate, sodium phosphate     LOS: 3 days   Emmagrace Runkel A. Merlene Laughter, M.D.  Diplomate, Tax adviser of Psychiatry and Neurology ( Neurology).

## 2015-08-25 NOTE — Progress Notes (Signed)
Nutrition Follow-up    INTERVENTION:  Glucerna Shake po TID, each supplement provides 220 kcal and 10 grams of protein   Heart Healthy/CHO modified diet    NUTRITION DIAGNOSIS:   Inadequate oral intake related to chronic nausea, intermittent vomiting and gastroparesis AEB pt hx  GOAL:  Pt to meet >/= 90% of their estimated nutrition needs      MONITOR:  Po intake, labs and wt trends     REASON FOR ASSESSMENT: Low Braden Score     ASSESSMENT: Patient presents with hx of diabetes, hyperlipidemia and hypertension. She presents to ED c/o left sided weakness and slurred speech. Her skin is intact. Patient is known to RD from [facility]. Her weight has been averaging 176-181# over the past 90 days. Her meal intake has been fair 25-50% of most meals. She is able to self-feed and has problems with intermittent vomiting and has been taking an antiemetic.  She usually drinks oral nutrition supplements to assist with meeting her daily needs.  Recent Labs Lab 08/22/15 0811 08/22/15 0818 08/23/15 0551 08/24/15 0549  NA 136 138 139 138  K 4.1 3.9 3.6 3.4*  CL 105 101 109 114*  CO2 24  --  25 21*  BUN 42* 38* 31* 20  CREATININE 2.53* 2.60* 1.48* 0.98  CALCIUM 9.1  --  8.5* 8.2*  GLUCOSE 127* 123* 108* 121*   Labs and meds reviewed. Long and short acting insulin, colace, miralax and potassium. BUN and Creat improved to normal range.  Diet Order:  Diet heart healthy/carb modified Room service appropriate? Yes; Fluid consistency: Thin  Skin:   intact  Last BM:   08/24/15  Height:   Ht Readings from Last 1 Encounters:  08/22/15 5\' 3"  (1.6 m)    Weight:   Wt Readings from Last 1 Encounters:  08/22/15 182 lb 12.2 oz (82.9 kg)    Ideal Body Weight:     BMI:  Body mass index is 32.37 kg/m. obesity class I   Estimated Nutritional Needs:   Kcal:   1400-1600  Protein:   85-90 gr  Fluid:   >1500 ml daily  EDUCATION NEEDS:  None identified at this time    Colman Cater  MS,RD,CSG,LDN Office: [phone] Pager: [phone]

## 2015-08-25 NOTE — Care Management Important Message (Signed)
Important Message  Patient Details  Name: Emalina C Martinique MRN: [id number] Date of Birth: 05-10-1945   Medicare Important Message Given:  Yes    Sherald Barge, RN 08/25/2015, 2:10 PM

## 2015-08-25 NOTE — Progress Notes (Signed)
Subjective: She is overall about the same. She had a Foley catheter placed yesterday because she was leaking urine and very uncomfortable. She feels some better today. She still has dense left hemiparesis.  Objective: Vital signs in last 24 hours: Temp:  [97.6 F (36.4 C)-98.5 F (36.9 C)] 98 F (36.7 C) (07/31 0812) Pulse Rate:  [58-72] 63 (07/31 0812) Resp:  [15-20] 16 (07/31 0812) BP: (144-169)/(62-77) 169/77 (07/31 0812) SpO2:  [97 %-100 %] 98 % (07/31 0812) Weight change:  Last BM Date: 08/24/15  Intake/Output from previous day: 07/30 0701 - 07/31 0700 In: 2388.3 [I.V.:2338.3; IV Piggyback:50] Out: 500 [Urine:500]  PHYSICAL EXAM General appearance: alert, cooperative, mild distress and Mildly confused Resp: clear to auscultation bilaterally Cardio: regular rate and rhythm, S1, S2 normal, no murmur, click, rub or gallop GI: soft, non-tender; bowel sounds normal; no masses,  no organomegaly Extremities: extremities normal, atraumatic, no cyanosis or edema  Lab Results:  Results for orders placed or performed during the hospital encounter of 08/22/15 (from the past 48 hour(s))  Glucose, capillary     Status: Abnormal   Collection Time: 08/23/15 11:21 AM  Result Value Ref Range   Glucose-Capillary 171 (H) 65 - 99 mg/dL   Comment 1 Notify RN   Glucose, capillary     Status: Abnormal   Collection Time: 08/23/15  4:31 PM  Result Value Ref Range   Glucose-Capillary 125 (H) 65 - 99 mg/dL   Comment 1 Notify RN   Glucose, capillary     Status: Abnormal   Collection Time: 08/23/15  9:39 PM  Result Value Ref Range   Glucose-Capillary 143 (H) 65 - 99 mg/dL   Comment 1 Notify RN    Comment 2 Document in Chart   Basic metabolic panel     Status: Abnormal   Collection Time: 08/24/15  5:49 AM  Result Value Ref Range   Sodium 138 135 - 145 mmol/L   Potassium 3.4 (L) 3.5 - 5.1 mmol/L   Chloride 114 (H) 101 - 111 mmol/L   CO2 21 (L) 22 - 32 mmol/L   Glucose, Bld 121 (H) 65 - 99  mg/dL   BUN 20 6 - 20 mg/dL   Creatinine, Ser 0.98 0.44 - 1.00 mg/dL   Calcium 8.2 (L) 8.9 - 10.3 mg/dL   GFR calc non Af Amer 57 (L) >60 mL/min   GFR calc Af Amer >60 >60 mL/min    Comment: (NOTE) The eGFR has been calculated using the CKD EPI equation. This calculation has not been validated in all clinical situations. eGFR's persistently <60 mL/min signify possible Chronic Kidney Disease.    Anion gap 3 (L) 5 - 15  Glucose, capillary     Status: Abnormal   Collection Time: 08/24/15  7:34 AM  Result Value Ref Range   Glucose-Capillary 129 (H) 65 - 99 mg/dL   Comment 1 Notify RN   Glucose, capillary     Status: Abnormal   Collection Time: 08/24/15 11:22 AM  Result Value Ref Range   Glucose-Capillary 173 (H) 65 - 99 mg/dL   Comment 1 Notify RN   Glucose, capillary     Status: Abnormal   Collection Time: 08/24/15  4:14 PM  Result Value Ref Range   Glucose-Capillary 175 (H) 65 - 99 mg/dL   Comment 1 Notify RN    Comment 2 Document in Chart   Glucose, capillary     Status: Abnormal   Collection Time: 08/24/15  8:23 PM  Result Value  Ref Range   Glucose-Capillary 151 (H) 65 - 99 mg/dL  Glucose, capillary     Status: Abnormal   Collection Time: 08/25/15  7:33 AM  Result Value Ref Range   Glucose-Capillary 162 (H) 65 - 99 mg/dL    ABGS No results for input(s): PHART, PO2ART, TCO2, HCO3 in the last 72 hours.  Invalid input(s): PCO2 CULTURES Recent Results (from the past 240 hour(s))  Urine culture     Status: Abnormal   Collection Time: 08/22/15  9:11 AM  Result Value Ref Range Status   Specimen Description URINE, CLEAN CATCH  Final   Special Requests NONE  Final   Culture 60,000 COLONIES/mL ESCHERICHIA COLI (A)  Final   Report Status 08/24/2015 FINAL  Final   Organism ID, Bacteria ESCHERICHIA COLI (A)  Final      Susceptibility   Escherichia coli - MIC*    AMPICILLIN >=32 RESISTANT Resistant     CEFAZOLIN <=4 SENSITIVE Sensitive     CEFTRIAXONE <=1 SENSITIVE  Sensitive     CIPROFLOXACIN <=0.25 SENSITIVE Sensitive     GENTAMICIN <=1 SENSITIVE Sensitive     IMIPENEM <=0.25 SENSITIVE Sensitive     NITROFURANTOIN <=16 SENSITIVE Sensitive     TRIMETH/SULFA <=20 SENSITIVE Sensitive     AMPICILLIN/SULBACTAM 16 INTERMEDIATE Intermediate     PIP/TAZO <=4 SENSITIVE Sensitive     * 60,000 COLONIES/mL ESCHERICHIA COLI   Studies/Results: US Carotid Bilateral  Result Date: 08/23/2015 CLINICAL DATA:  Acute CVA with left-sided weakness, dysphagia and altered mental status. History of hypertension, hyperlipidemia and diabetes. EXAM: BILATERAL CAROTID DUPLEX ULTRASOUND TECHNIQUE: Pearline Cables scale imaging, color Doppler and duplex ultrasound were performed of bilateral carotid and vertebral arteries in the neck. COMPARISON:  Head CT -08/22/2015 FINDINGS: Criteria: Quantification of carotid stenosis is based on velocity parameters that correlate the residual internal carotid diameter with NASCET-based stenosis levels, using the diameter of the distal internal carotid lumen as the denominator for stenosis measurement. The following velocity measurements were obtained: RIGHT ICA:  87/17 cm/sec CCA:  76/73 cm/sec SYSTOLIC ICA/CCA RATIO:  1.2 DIASTOLIC ICA/CCA RATIO:  1.3 ECA:  118 cm/sec LEFT ICA:  98/28 cm/sec CCA:  41/93 cm/sec SYSTOLIC ICA/CCA RATIO:  1.1 DIASTOLIC ICA/CCA RATIO:  1.2 ECA:  146 cm/sec RIGHT CAROTID ARTERY: There is a minimal amount of scattered eccentric mixed echogenic plaque throughout the right common carotid artery. There is a minimal amount of eccentric mixed echogenic plaque involving the origin and proximal aspects of the right internal carotid artery (image 24), not resulting in elevated peak systolic velocities within the interrogated course the right internal carotid artery to suggest a hemodynamically significant stenosis. RIGHT VERTEBRAL ARTERY:  Antegrade Flow LEFT CAROTID ARTERY: There is a minimal amount of intimal thickening throughout the left common  carotid artery (representative image 45). There are no elevated peak systolic velocities within the interrogated course of the left internal carotid artery to suggest a hemodynamically significant stenosis. LEFT VERTEBRAL ARTERY:  Antegrade Flow IMPRESSION: Minimal amount of bilateral intimal thickening and atherosclerotic plaque, right greater than left, not resulting in a hemodynamically significant stenosis within either internal carotid artery. Electronically Signed   By: Sandi Mariscal M.D.   On: 08/23/2015 11:40   Medications:  Prior to Admission:  Prescriptions Prior to Admission  Medication Sig Dispense Refill Last Dose  . acetaminophen (TYLENOL) 325 MG tablet Take 2 tablets (650 mg total) by mouth every 6 (six) hours as needed for mild pain, moderate pain, fever or headache. 60 tablet 5  08/18/2015 at Unknown time  . aspirin EC 81 MG tablet Take 81 mg by mouth daily.   08/21/2015 at 0900  . buPROPion (WELLBUTRIN XL) 150 MG 24 hr tablet Take 150 mg by mouth daily.   08/21/2015 at Unknown time  . cholecalciferol (VITAMIN D) 1000 units tablet Take 1,000 Units by mouth daily.   08/21/2015 at Unknown time  . docusate sodium (COLACE) 100 MG capsule Take 100 mg by mouth 2 (two) times daily.   08/21/2015 at Unknown time  . feeding supplement (ENSURE IMMUNE HEALTH) LIQD Take 237 mLs by mouth 2 (two) times daily.   08/21/2015 at Unknown time  . gabapentin (NEURONTIN) 600 MG tablet Take 1,200 mg by mouth at bedtime.   08/21/2015 at Unknown time  . guaiFENesin-dextromethorphan (ROBITUSSIN DM) 100-10 MG/5ML syrup Take 15 mLs by mouth every 6 (six) hours as needed for cough.   unknown  . insulin detemir (LEVEMIR) 100 UNIT/ML injection Inject 0.25 mLs (25 Units total) into the skin at bedtime. 10 mL 11 08/21/2015 at Unknown time  . insulin lispro (HUMALOG KWIKPEN) 100 UNIT/ML KiwkPen Inject 3-20 Units into the skin 3 (three) times daily. Sliding Scale: <60--Call MD 121-150= 3 units 151-200= 4 units 201-250= 7  units 251-300=11units 301-350=15units 351-400=20units >400= Call MD   08/22/2015 at Unknown time  . insulin lispro (HUMALOG KWIKPEN) 100 UNIT/ML KiwkPen Inject 2-5 Units into the skin at bedtime. Per sliding scale: If blood sugar is less than 60, call MD. 201-250= 2 units 251-300= 3 units 301-350= 4 units 351-400= 5 units >400= Call MD   08/21/2015 at Unknown time  . metoprolol (LOPRESSOR) 50 MG tablet Take 1 tablet (50 mg total) by mouth 2 (two) times daily.   08/21/2015 at 0839  . Multiple Vitamins-Minerals (CENTRUM SILVER PO) Take 1 tablet by mouth daily.   08/21/2015 at Unknown time  . ondansetron (ZOFRAN) 8 MG tablet Take 1 tablet (8 mg total) by mouth 4 (four) times daily -  before meals and at bedtime. (Patient taking differently: Take 8 mg by mouth every 6 (six) hours. ) 20 tablet 0 08/22/2015 at 0546  . oxyCODONE-acetaminophen (PERCOCET/ROXICET) 5-325 MG tablet Take 1 tablet by mouth every 6 (six) hours as needed. (Patient taking differently: Take 1 tablet by mouth every 6 (six) hours as needed for moderate pain. ) 30 tablet 0 08/21/2015 at Unknown time  . pantoprazole (PROTONIX) 40 MG tablet Take 1 tablet (40 mg total) by mouth 2 (two) times daily. 60 tablet 12 08/21/2015 at Unknown time  . polyethylene glycol (MIRALAX / GLYCOLAX) packet Take 17 g by mouth daily.   08/21/2015 at Unknown time  . potassium chloride SA (K-DUR,KLOR-CON) 20 MEQ tablet Take 2 tablets (40 mEq total) by mouth daily. (Patient taking differently: Take 20 mEq by mouth 2 (two) times daily. )   08/21/2015 at Unknown time  . pravastatin (PRAVACHOL) 40 MG tablet Take 40 mg by mouth at bedtime.    08/21/2015 at Unknown time  . promethazine (PHENERGAN) 25 MG tablet Take 25 mg by mouth every 8 (eight) hours as needed for nausea or vomiting.    08/18/2015 at Unknown time  . topiramate (TOPAMAX) 25 MG tablet Take 25 mg by mouth 2 (two) times daily.    08/21/2015 at Unknown time  . verapamil (CALAN-SR) 240 MG CR tablet Take 240 mg by  mouth daily with breakfast.    08/21/2015 at Unknown time  . [DISCONTINUED] lidocaine (LIDODERM) 5 % Place 1 patch onto the skin daily.  Remove & Discard patch within 12 hours or as directed by MD (Patient not taking: Reported on 11/30/2014) 30 patch 1 Not Taking at Unknown time  . [DISCONTINUED] metoCLOPramide (REGLAN) 10 MG tablet Take 1 tablet (10 mg total) by mouth 4 (four) times daily -  before meals and at bedtime. (Patient not taking: Reported on 03/18/2015)   Not Taking at Unknown time  . [DISCONTINUED] ondansetron (ZOFRAN) 4 MG tablet Take 1 tablet (4 mg total) by mouth every 6 (six) hours as needed for nausea. (Patient not taking: Reported on 08/22/2015) 20 tablet 0 Not Taking at Unknown time   Scheduled: . aspirin  325 mg Oral Daily  . buPROPion  150 mg Oral Daily  . cefTRIAXone (ROCEPHIN)  IV  1 g Intravenous Q24H  . cholecalciferol  1,000 Units Oral Daily  . docusate sodium  100 mg Oral BID  . enoxaparin (LOVENOX) injection  40 mg Subcutaneous Q24H  . feeding supplement (ENSURE ENLIVE)  237 mL Oral BID  . gabapentin  1,200 mg Oral QHS  . insulin aspart  0-20 Units Subcutaneous TID WC  . insulin aspart  0-5 Units Subcutaneous QHS  . insulin detemir  25 Units Subcutaneous QHS  . metoprolol  50 mg Oral BID  . ondansetron  8 mg Oral Q6H  . pantoprazole  40 mg Oral BID  . polyethylene glycol  17 g Oral Daily  . potassium chloride SA  20 mEq Oral BID  . pravastatin  40 mg Oral QHS  . topiramate  25 mg Oral BID  . verapamil  240 mg Oral Q breakfast   Continuous: . sodium chloride 100 mL/hr at 08/24/15 1720   [id number], oxyCODONE-acetaminophen, promethazine, senna-docusate, sodium phosphate  Assesment: She was admitted with a stroke. She is doing about the same. No real recovery at this point. PTOT and speech are involved. She now has a Foley catheter and has a urinary tract infection but appears to be on the appropriate antibiotic. Palliative care consultation has been  requested Active Problems:   Essential hypertension   UTI (lower urinary tract infection)   Dyslipidemia   Gastroparesis   Dehydration   CVA (cerebral infarction)   AKI (acute kidney injury) ([place])   Left-sided weakness   Stroke (cerebrum) ([facility])    Plan: Continue treatments continue Foley catheter for now    LOS: 3 days   Teryl Mcconaghy L 08/25/2015, 8:53 AM

## 2015-08-25 NOTE — Progress Notes (Signed)
EEG Completed; Results Pending  

## 2015-08-25 NOTE — NC FL2 (Signed)
[place] LEVEL OF CARE SCREENING TOOL     IDENTIFICATION  Patient Name: Lori Mahoney Birthdate: 1945-01-28 Sex: female Admission Date (Current Location): 08/22/2015  [place] and [place] Number:  [place] [id number] [place] and Address:  [place] [address], [place]      Provider Number: [id number]  Attending Physician Name and Address:  [name], MD  Relative Name and Phone Number:       Current Level of Care: Hospital Recommended Level of Care: [place] Prior Approval Number:    Date Approved/Denied:   PASRR Number: [id number]  Discharge Plan: SNF    Current Diagnoses: Patient Active Problem List   Diagnosis Date Noted  . CVA (cerebral infarction) 08/22/2015  . AKI (acute kidney injury) ([place]) 08/22/2015  . Left-sided weakness 08/22/2015  . Stroke (cerebrum) ([place]) 08/22/2015  . Dehydration 02/06/2015  . Intractable nausea and vomiting 02/03/2015  . Hypokalemia 02/03/2015  . Depression 02/03/2015  . Gastroparesis 01/30/2015  . 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 Vomiting 06/21/2013  . HTN (hypertension) 06/21/2013  . Dyslipidemia 06/21/2013  . Bursitis of shoulder, right 02/01/2012  . Shoulder pain 02/01/2012  . UTI (lower urinary tract infection) 10/09/2011  . Metabolic encephalopathy [id number]  . Nausea and vomiting 12/29/2010  . Hip pain 10/20/2010  . DDD (degenerative disc disease), lumbar 10/20/2010  . Diabetes ([place]) 12/23/2008  . SPINAL STENOSIS 12/23/2008  . LOW BACK PAIN 12/23/2008  . 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 Essential hypertension 12/18/2008    Orientation RESPIRATION BLADDER Height & Weight     Self, Place  Normal Indwelling catheter Weight: 182 lb 12.2 oz (82.9 kg) Height:  5\' 3"  (160 cm)  BEHAVIORAL SYMPTOMS/MOOD NEUROLOGICAL BOWEL NUTRITION STATUS  Other (Comment) (n/a)  (n/a) Incontinent Diet (Heart healthy/carb modified)  AMBULATORY STATUS COMMUNICATION  OF NEEDS Skin   Total Care Verbally Other (Comment) (Moisture associated skin damage)                       Personal Care Assistance Level of Assistance  Bathing, Feeding, Dressing Bathing Assistance: Maximum assistance Feeding assistance: Maximum assistance Dressing Assistance: Maximum assistance     Functional Limitations Info  Sight, Hearing, Speech Sight Info: Impaired Hearing Info: Impaired Speech Info: Impaired    SPECIAL CARE FACTORS FREQUENCY  OT (By licensed OT)  PT                    Contractures      Additional Factors Info  Insulin Sliding Scale Code Status Info: DNR Allergies Info: Codeine, Hydrocodone-acetaminophen, Meperidine Hcl, Contrast Media (Iodinated Diagnostic Agents), Oxycodone, Tetracyclines & Related           Current Medications (08/25/2015):  This is the current hospital active medication list Current Facility-Administered Medications  Medication Dose Route Frequency Provider Last Rate Last Dose  . 0.9 %  sodium chloride infusion   Intravenous Continuous Kathie Dike, MD 100 mL/hr at 08/24/15 1720    . acetaminophen (TYLENOL) tablet 650 mg  650 mg Oral Q4H PRN [name], MD   650 mg at 08/24/15 1659  . aspirin tablet 325 mg  325 mg Oral Daily Kathie Dike, MD   325 mg at 08/24/15 [id number]  . buPROPion (WELLBUTRIN XL) 24 hr tablet 150 mg  150 mg Oral Daily Kathie Dike, MD   150 mg at 08/24/15 1000  . cefTRIAXone (ROCEPHIN) 1 g in dextrose 5 % 50 mL IVPB  1 g  Intravenous Q24H Kathie Dike, MD   1 g at 08/24/15 [id number]  . cholecalciferol (VITAMIN D) tablet 1,000 Units  1,000 Units Oral Daily Kathie Dike, MD   1,000 Units at 08/24/15 [phone]  . docusate sodium (COLACE) capsule 100 mg  100 mg Oral BID Kathie Dike, MD   100 mg at 08/24/15 2255  . enoxaparin (LOVENOX) injection 40 mg  40 mg Subcutaneous Q24H [name], MD   40 mg at 08/24/15 2254  . feeding supplement (ENSURE ENLIVE) (ENSURE ENLIVE) liquid 237 mL  237 mL Oral  BID Kathie Dike, MD   237 mL at 08/24/15 2255  . gabapentin (NEURONTIN) capsule 1,200 mg  1,200 mg Oral QHS [name], MD   1,200 mg at 08/24/15 2255  . insulin aspart (novoLOG) injection 0-20 Units  0-20 Units Subcutaneous TID WC Kathie Dike, MD   4 Units at 08/25/15 0819  . insulin aspart (novoLOG) injection 0-5 Units  0-5 Units Subcutaneous QHS Kathie Dike, MD      . insulin detemir (LEVEMIR) injection 25 Units  25 Units Subcutaneous QHS Kathie Dike, MD   25 Units at 08/24/15 2254  . metoprolol (LOPRESSOR) tablet 50 mg  50 mg Oral BID Kathie Dike, MD   50 mg at 08/24/15 2255  . ondansetron (ZOFRAN) tablet 8 mg  8 mg Oral Q6H Kathie Dike, MD   8 mg at 08/24/15 2255  . oxyCODONE-acetaminophen (PERCOCET/ROXICET) 5-325 MG per tablet 1 tablet  1 tablet Oral Q6H PRN Kathie Dike, MD      . pantoprazole (PROTONIX) EC tablet 40 mg  40 mg Oral BID Kathie Dike, MD   40 mg at 08/24/15 2254  . polyethylene glycol (MIRALAX / GLYCOLAX) packet 17 g  17 g Oral Daily Kathie Dike, MD   17 g at 08/24/15 [id number]  . potassium chloride SA (K-DUR,KLOR-CON) CR tablet 20 mEq  20 mEq Oral BID Kathie Dike, MD   20 mEq at 08/24/15 2254  . pravastatin (PRAVACHOL) tablet 40 mg  40 mg Oral QHS Kathie Dike, MD   40 mg at 08/24/15 2255  . promethazine (PHENERGAN) tablet 12.5 mg  12.5 mg Oral Q6H PRN [name], MD   12.5 mg at 08/24/15 2255  . senna-docusate (Senokot-S) tablet 1 tablet  1 tablet Oral QHS PRN Kathie Dike, MD   1 tablet at 08/24/15 1452  . sodium phosphate (FLEET) 7-19 GM/118ML enema 1 enema  1 enema Rectal PRN [name], MD      . topiramate (TOPAMAX) tablet 25 mg  25 mg Oral BID Kathie Dike, MD   25 mg at 08/24/15 2255  . verapamil (CALAN-SR) CR tablet 240 mg  240 mg Oral Q breakfast Kathie Dike, MD   240 mg at 08/25/15 0820     Discharge Medications: Please see discharge summary for a list of discharge medications.  Relevant Imaging Results:  Relevant  Lab Results:   Additional Information    Salome Arnt, [place]

## 2015-08-26 LAB — BASIC METABOLIC PANEL
BUN: 15 mg/dL (ref 6–20)
CALCIUM: 8.3 mg/dL — AB (ref 8.9–10.3)
CO2: 23 mmol/L (ref 22–32)
Chloride: 112 mmol/L — ABNORMAL HIGH (ref 101–111)
Creatinine, Ser: 0.9 mg/dL (ref 0.44–1.00)
GFR calc Af Amer: >60 mL/min (ref >60)
GLUCOSE: 182 mg/dL — AB (ref 65–99)
POTASSIUM: 3.4 mmol/L — AB (ref 3.5–5.1)
Sodium: 137 mmol/L (ref 135–145)

## 2015-08-26 LAB — GLUCOSE, CAPILLARY
GLUCOSE-CAPILLARY: 166 mg/dL — AB (ref 65–99)
GLUCOSE-CAPILLARY: 167 mg/dL — AB (ref 65–99)
Glucose-Capillary: 132 mg/dL — ABNORMAL HIGH (ref 65–99)
Glucose-Capillary: 140 mg/dL — ABNORMAL HIGH (ref 65–99)

## 2015-08-26 LAB — CBC WITH DIFFERENTIAL/PLATELET
Basophils Absolute: 0 10*3/uL (ref 0.0–0.1)
Basophils Relative: 0 %
Eosinophils Absolute: 0.4 10*3/uL (ref 0.0–0.7)
Eosinophils Relative: 5 %
HEMATOCRIT: 32.6 % — AB (ref 36.0–46.0)
Hemoglobin: 11 g/dL — ABNORMAL LOW (ref 12.0–15.0)
LYMPHS PCT: 34 %
Lymphs Abs: 3 10*3/uL (ref 0.7–4.0)
MCH: 29.9 pg (ref 26.0–34.0)
MCHC: 33.7 g/dL (ref 30.0–36.0)
MCV: 88.6 fL (ref 78.0–100.0)
MONO ABS: 0.5 10*3/uL (ref 0.1–1.0)
MONOS PCT: 6 %
NEUTROS ABS: 5 10*3/uL (ref 1.7–7.7)
Neutrophils Relative %: 55 %
Platelets: 185 10*3/uL (ref 150–400)
RBC: 3.68 MIL/uL — ABNORMAL LOW (ref 3.87–5.11)
RDW: 13.1 % (ref 11.5–15.5)
WBC: 8.9 10*3/uL (ref 4.0–10.5)

## 2015-08-26 LAB — URINE CULTURE: Culture: >=100000 — AB

## 2015-08-26 MED ORDER — POTASSIUM CHLORIDE CRYS ER 20 MEQ PO TBCR
20.0000 meq | EXTENDED_RELEASE_TABLET | Freq: Three times a day (TID) | ORAL | Status: DC
  Administered 2015-08-26 – 2015-08-27 (×4): 20 meq via ORAL
  Filled 2015-08-26 (×4): qty 1

## 2015-08-26 MED ORDER — FAMOTIDINE 20 MG PO TABS
20.0000 mg | ORAL_TABLET | Freq: Two times a day (BID) | ORAL | Status: DC
  Administered 2015-08-26 – 2015-08-27 (×3): 20 mg via ORAL
  Filled 2015-08-26 (×3): qty 1

## 2015-08-26 MED ORDER — LORATADINE 10 MG PO TABS
10.0000 mg | ORAL_TABLET | Freq: Every day | ORAL | Status: DC
  Administered 2015-08-26 – 2015-08-27 (×2): 10 mg via ORAL
  Filled 2015-08-26 (×2): qty 1

## 2015-08-26 NOTE — Progress Notes (Signed)
Subjective: She had a difficult night last night. She is very itchy today. Her potassium level is still low. EEG is still pending. She had discussion with her family last night and they now know her wishes.  Objective: Vital signs in last 24 hours: Temp:  [98 F (36.7 C)-98.9 F (37.2 C)] 98.6 F (37 C) (08/01 0812) Pulse Rate:  [55-73] 73 (08/01 0412) Resp:  [16-20] 20 (08/01 0412) BP: (156-182)/(68-78) 158/78 (08/01 0412) SpO2:  [97 %-98 %] 97 % (08/01 0412) Weight change:  Last BM Date: 08/24/15  Intake/Output from previous day: 07/31 0701 - 08/01 0700 In: 720 [P.O.:720] Out: 3500 [Urine:3500]  PHYSICAL EXAM General appearance: alert, cooperative and With left hemiparesis Resp: clear to auscultation bilaterally Cardio: regular rate and rhythm, S1, S2 normal, no murmur, click, rub or gallop GI: soft, non-tender; bowel sounds normal; no masses,  no organomegaly Extremities: extremities normal, atraumatic, no cyanosis or edema  Lab Results:  Results for orders placed or performed during the hospital encounter of 08/22/15 (from the past 48 hour(s))  Glucose, capillary     Status: Abnormal   Collection Time: 08/24/15 11:22 AM  Result Value Ref Range   Glucose-Capillary 173 (H) 65 - 99 mg/dL   Comment 1 Notify RN   Glucose, capillary     Status: Abnormal   Collection Time: 08/24/15  4:14 PM  Result Value Ref Range   Glucose-Capillary 175 (H) 65 - 99 mg/dL   Comment 1 Notify RN    Comment 2 Document in Chart   Glucose, capillary     Status: Abnormal   Collection Time: 08/24/15  8:23 PM  Result Value Ref Range   Glucose-Capillary 151 (H) 65 - 99 mg/dL  Glucose, capillary     Status: Abnormal   Collection Time: 08/25/15  7:33 AM  Result Value Ref Range   Glucose-Capillary 162 (H) 65 - 99 mg/dL  Glucose, capillary     Status: Abnormal   Collection Time: 08/25/15 11:34 AM  Result Value Ref Range   Glucose-Capillary 125 (H) 65 - 99 mg/dL  Glucose, capillary     Status:  Abnormal   Collection Time: 08/25/15  4:37 PM  Result Value Ref Range   Glucose-Capillary 187 (H) 65 - 99 mg/dL  Glucose, capillary     Status: Abnormal   Collection Time: 08/25/15  8:23 PM  Result Value Ref Range   Glucose-Capillary 119 (H) 65 - 99 mg/dL   Comment 1 Notify RN    Comment 2 Document in Chart   CBC with Differential/Platelet     Status: Abnormal   Collection Time: 08/26/15  5:58 AM  Result Value Ref Range   WBC 8.9 4.0 - 10.5 K/uL   RBC 3.68 (L) 3.87 - 5.11 MIL/uL   Hemoglobin 11.0 (L) 12.0 - 15.0 g/dL   HCT 32.6 (L) 36.0 - 46.0 %   MCV 88.6 78.0 - 100.0 fL   MCH 29.9 26.0 - 34.0 pg   MCHC 33.7 30.0 - 36.0 g/dL   RDW 13.1 11.5 - 15.5 %   Platelets 185 150 - 400 K/uL   Neutrophils Relative % 55 %   Neutro Abs 5.0 1.7 - 7.7 K/uL   Lymphocytes Relative 34 %   Lymphs Abs 3.0 0.7 - 4.0 K/uL   Monocytes Relative 6 %   Monocytes Absolute 0.5 0.1 - 1.0 K/uL   Eosinophils Relative 5 %   Eosinophils Absolute 0.4 0.0 - 0.7 K/uL   Basophils Relative 0 %  Basophils Absolute 0.0 0.0 - 0.1 K/uL  Basic metabolic panel     Status: Abnormal   Collection Time: 08/26/15  5:58 AM  Result Value Ref Range   Sodium 137 135 - 145 mmol/L   Potassium 3.4 (L) 3.5 - 5.1 mmol/L   Chloride 112 (H) 101 - 111 mmol/L   CO2 23 22 - 32 mmol/L   Glucose, Bld 182 (H) 65 - 99 mg/dL   BUN 15 6 - 20 mg/dL   Creatinine, Ser 0.90 0.44 - 1.00 mg/dL   Calcium 8.3 (L) 8.9 - 10.3 mg/dL   GFR calc non Af Amer >60 >60 mL/min   GFR calc Af Amer >60 >60 mL/min    Comment: (NOTE) The eGFR has been calculated using the CKD EPI equation. This calculation has not been validated in all clinical situations. eGFR's persistently <60 mL/min signify possible Chronic Kidney Disease.     ABGS No results for input(s): PHART, PO2ART, TCO2, HCO3 in the last 72 hours.  Invalid input(s): PCO2 CULTURES Recent Results (from the past 240 hour(s))  Culture, Urine     Status: Abnormal   Collection Time: 08/22/15   9:00 AM  Result Value Ref Range Status   Specimen Description URINE, CATHETERIZED  Final   Special Requests NONE  Final   Culture >=100,000 COLONIES/mL ESCHERICHIA COLI (A)  Final   Report Status 08/26/2015 FINAL  Final   Organism ID, Bacteria ESCHERICHIA COLI (A)  Final      Susceptibility   Escherichia coli - MIC*    AMPICILLIN >=32 RESISTANT Resistant     CEFAZOLIN <=4 SENSITIVE Sensitive     CEFTRIAXONE <=1 SENSITIVE Sensitive     CIPROFLOXACIN <=0.25 SENSITIVE Sensitive     GENTAMICIN <=1 SENSITIVE Sensitive     IMIPENEM <=0.25 SENSITIVE Sensitive     NITROFURANTOIN <=16 SENSITIVE Sensitive     TRIMETH/SULFA <=20 SENSITIVE Sensitive     AMPICILLIN/SULBACTAM >=32 RESISTANT Resistant     PIP/TAZO <=4 SENSITIVE Sensitive     Extended ESBL NEGATIVE Sensitive     * >=100,000 COLONIES/mL ESCHERICHIA COLI  Urine culture     Status: Abnormal   Collection Time: 08/22/15  9:11 AM  Result Value Ref Range Status   Specimen Description URINE, CLEAN CATCH  Final   Special Requests NONE  Final   Culture 60,000 COLONIES/mL ESCHERICHIA COLI (A)  Final   Report Status 08/24/2015 FINAL  Final   Organism ID, Bacteria ESCHERICHIA COLI (A)  Final      Susceptibility   Escherichia coli - MIC*    AMPICILLIN >=32 RESISTANT Resistant     CEFAZOLIN <=4 SENSITIVE Sensitive     CEFTRIAXONE <=1 SENSITIVE Sensitive     CIPROFLOXACIN <=0.25 SENSITIVE Sensitive     GENTAMICIN <=1 SENSITIVE Sensitive     IMIPENEM <=0.25 SENSITIVE Sensitive     NITROFURANTOIN <=16 SENSITIVE Sensitive     TRIMETH/SULFA <=20 SENSITIVE Sensitive     AMPICILLIN/SULBACTAM 16 INTERMEDIATE Intermediate     PIP/TAZO <=4 SENSITIVE Sensitive     * 60,000 COLONIES/mL ESCHERICHIA COLI   Studies/Results: No results found.  Medications:  Prior to Admission:  Prescriptions Prior to Admission  Medication Sig Dispense Refill Last Dose  . acetaminophen (TYLENOL) 325 MG tablet Take 2 tablets (650 mg total) by mouth every 6 (six)  hours as needed for mild pain, moderate pain, fever or headache. 60 tablet 5 08/18/2015 at Unknown time  . aspirin EC 81 MG tablet Take 81 mg by mouth daily.  08/21/2015 at 0900  . buPROPion (WELLBUTRIN XL) 150 MG 24 hr tablet Take 150 mg by mouth daily.   08/21/2015 at Unknown time  . cholecalciferol (VITAMIN D) 1000 units tablet Take 1,000 Units by mouth daily.   08/21/2015 at Unknown time  . docusate sodium (COLACE) 100 MG capsule Take 100 mg by mouth 2 (two) times daily.   08/21/2015 at Unknown time  . feeding supplement (ENSURE IMMUNE HEALTH) LIQD Take 237 mLs by mouth 2 (two) times daily.   08/21/2015 at Unknown time  . gabapentin (NEURONTIN) 600 MG tablet Take 1,200 mg by mouth at bedtime.   08/21/2015 at Unknown time  . guaiFENesin-dextromethorphan (ROBITUSSIN DM) 100-10 MG/5ML syrup Take 15 mLs by mouth every 6 (six) hours as needed for cough.   unknown  . insulin detemir (LEVEMIR) 100 UNIT/ML injection Inject 0.25 mLs (25 Units total) into the skin at bedtime. 10 mL 11 08/21/2015 at Unknown time  . insulin lispro (HUMALOG KWIKPEN) 100 UNIT/ML KiwkPen Inject 3-20 Units into the skin 3 (three) times daily. Sliding Scale: <60--Call MD 121-150= 3 units 151-200= 4 units 201-250= 7 units 251-300=11units 301-350=15units 351-400=20units >400= Call MD   08/22/2015 at Unknown time  . insulin lispro (HUMALOG KWIKPEN) 100 UNIT/ML KiwkPen Inject 2-5 Units into the skin at bedtime. Per sliding scale: If blood sugar is less than 60, call MD. 201-250= 2 units 251-300= 3 units 301-350= 4 units 351-400= 5 units >400= Call MD   08/21/2015 at Unknown time  . metoprolol (LOPRESSOR) 50 MG tablet Take 1 tablet (50 mg total) by mouth 2 (two) times daily.   08/21/2015 at 0839  . Multiple Vitamins-Minerals (CENTRUM SILVER PO) Take 1 tablet by mouth daily.   08/21/2015 at Unknown time  . ondansetron (ZOFRAN) 8 MG tablet Take 1 tablet (8 mg total) by mouth 4 (four) times daily -  before meals and at bedtime. (Patient  taking differently: Take 8 mg by mouth every 6 (six) hours. ) 20 tablet 0 08/22/2015 at 0546  . oxyCODONE-acetaminophen (PERCOCET/ROXICET) 5-325 MG tablet Take 1 tablet by mouth every 6 (six) hours as needed. (Patient taking differently: Take 1 tablet by mouth every 6 (six) hours as needed for moderate pain. ) 30 tablet 0 08/21/2015 at Unknown time  . pantoprazole (PROTONIX) 40 MG tablet Take 1 tablet (40 mg total) by mouth 2 (two) times daily. 60 tablet 12 08/21/2015 at Unknown time  . polyethylene glycol (MIRALAX / GLYCOLAX) packet Take 17 g by mouth daily.   08/21/2015 at Unknown time  . potassium chloride SA (K-DUR,KLOR-CON) 20 MEQ tablet Take 2 tablets (40 mEq total) by mouth daily. (Patient taking differently: Take 20 mEq by mouth 2 (two) times daily. )   08/21/2015 at Unknown time  . pravastatin (PRAVACHOL) 40 MG tablet Take 40 mg by mouth at bedtime.    08/21/2015 at Unknown time  . promethazine (PHENERGAN) 25 MG tablet Take 25 mg by mouth every 8 (eight) hours as needed for nausea or vomiting.    08/18/2015 at Unknown time  . topiramate (TOPAMAX) 25 MG tablet Take 25 mg by mouth 2 (two) times daily.    08/21/2015 at Unknown time  . verapamil (CALAN-SR) 240 MG CR tablet Take 240 mg by mouth daily with breakfast.    08/21/2015 at Unknown time  . [DISCONTINUED] lidocaine (LIDODERM) 5 % Place 1 patch onto the skin daily. Remove & Discard patch within 12 hours or as directed by MD (Patient not taking: Reported on 11/30/2014) 30 patch  1 Not Taking at Unknown time  . [DISCONTINUED] metoCLOPramide (REGLAN) 10 MG tablet Take 1 tablet (10 mg total) by mouth 4 (four) times daily -  before meals and at bedtime. (Patient not taking: Reported on 03/18/2015)   Not Taking at Unknown time  . [DISCONTINUED] ondansetron (ZOFRAN) 4 MG tablet Take 1 tablet (4 mg total) by mouth every 6 (six) hours as needed for nausea. (Patient not taking: Reported on 08/22/2015) 20 tablet 0 Not Taking at Unknown time   Scheduled: . aspirin   325 mg Oral Daily  . buPROPion  150 mg Oral Daily  . cefTRIAXone (ROCEPHIN)  IV  1 g Intravenous Q24H  . cholecalciferol  1,000 Units Oral Daily  . docusate sodium  100 mg Oral BID  . enoxaparin (LOVENOX) injection  40 mg Subcutaneous Q24H  . famotidine  20 mg Oral BID  . feeding supplement (ENSURE ENLIVE)  237 mL Oral BID  . feeding supplement (GLUCERNA SHAKE)  237 mL Oral TID BM  . gabapentin  1,200 mg Oral QHS  . insulin aspart  0-20 Units Subcutaneous TID WC  . insulin aspart  0-5 Units Subcutaneous QHS  . insulin detemir  25 Units Subcutaneous QHS  . loratadine  10 mg Oral Daily  . metoprolol  50 mg Oral BID  . ondansetron  8 mg Oral Q6H  . pantoprazole  40 mg Oral BID  . polyethylene glycol  17 g Oral Daily  . potassium chloride SA  20 mEq Oral TID  . pravastatin  40 mg Oral QHS  . topiramate  25 mg Oral BID  . verapamil  240 mg Oral Q breakfast   Continuous: . sodium chloride 100 mL/hr at 08/26/15 0101   WNU:[id number], oxyCODONE-acetaminophen, promethazine, senna-docusate, sodium phosphate  Assesment: She has had a stroke. She is better but still having trouble. EEG is pending. She is hypokalemic. She has significant itching. Active Problems:   Essential hypertension   Urinary tract infectious disease   Dyslipidemia   Gastroparesis   Dehydration   CVA (cerebral infarction)   AKI (acute kidney injury) ([place])   Left-sided weakness   Cerebrovascular accident (CVA) due to occlusion of carotid artery [facility])   Palliative care encounter   Goals of care, counseling/discussion   DNR (do not resuscitate) discussion    Plan: Continue treatments. Await EEG. Continue treating urinary tract infection. She has Foley catheter in. Probable discharge back to skilled care facility tomorrow. Add Claritin and Pepcid to try to treat her itching. I think Benadryl will make her too sleepy which is a problem anyway    LOS: 4 days   Adriauna Campton L 08/26/2015, 8:54 AM

## 2015-08-26 NOTE — Progress Notes (Signed)
Physical Therapy Treatment Patient Details Name: Lori Mahoney MRN: [id number] DOB: [birth year] Today's Date: 08/26/2015    History of Present Illness 70 yo F admitted 08/22/2015 with worsening L sided weakness and dysarthria Dx: acute R frontal cortical infarct.  PMH: anemia, arthritis, CA, depression, DM, disc degeneration lumbar, gastroparesis, hiatal hernia, HTN, neuropathy, spinal stenosis, CVA, UTI, L3-S1 fusion, L TKA.    PT Comments    Pt received in bed, dtr and granddaughter present, and pt is agreeable to PT tx.  Pt continues to require Total A for transfers.  She was able to sit on the EOB for ~10 min and perform lateral weight shifting onto her elbows with Mod/Max A from PT.  Continue to recommend SNF.   Follow Up Recommendations  SNF     Equipment Recommendations  None recommended by PT    Recommendations for Other Services       Precautions / Restrictions Precautions Precautions: Fall Precaution Comments: due to immobility Restrictions Weight Bearing Restrictions: No    Mobility  Bed Mobility Overal bed mobility: Needs Assistance Bed Mobility: Rolling;Supine to Sit;Sit to Supine     Supine to sit: Total assist;HOB elevated Sit to supine: Total assist   General bed mobility comments: Pt required Max A for static sitting balance on EOB due to heavy lateral leant to left side due to left side hemiparesis.   Transfers Overall transfer level: Needs assistance                  Ambulation/Gait                 Stairs            Wheelchair Mobility    Modified Rankin (Stroke Patients Only)       Balance Overall balance assessment: Needs assistance Sitting-balance support: Bilateral upper extremity supported;Feet supported (L sided hemiparesis) Sitting balance-Leahy Scale: Poor Sitting balance - Comments: Pt required Max A for static sitting balance on the EOB due to posterior and L lateral lean with L sided hemiparesis.  Pt holding  on to R bedrail.  Pt able to perform right and left wweight shifting onto elbows with Mod A on the right, and Max A on the left. x 6 reps each direction.  Postural control: Posterior lean;Left lateral lean                          Cognition Arousal/Alertness: Awake/alert Behavior During Therapy: Flat affect Overall Cognitive Status: Impaired/Different from baseline Area of Impairment: Orientation Orientation Level: Disoriented to;Place                  Exercises      General Comments        Pertinent Vitals/Pain Pain Assessment: No/denies pain    Home Living                      Prior Function            PT Goals (current goals can now be found in the care plan section) Acute Rehab PT Goals Patient Stated Goal: None stated PT Goal Formulation: With patient/family Time For Goal Achievement: 09/06/15 Potential to Achieve Goals: Fair Progress towards PT goals: Progressing toward goals    Frequency  Min 5X/week    PT Plan Current plan remains appropriate    Co-evaluation             End of Session  Activity Tolerance: Patient tolerated treatment well Patient left: in chair;with call bell/phone within reach;with family/visitor present     Time: 0842-0910 PT Time Calculation (min) (ACUTE ONLY): 28 min  Charges:  $Therapeutic Activity: 8-22 mins $Neuromuscular Re-education: 8-22 mins                    G Codes:     Beth Evy Lutterman, PT, DPT X: [phone]

## 2015-08-26 NOTE — Procedures (Signed)
Lori A. Merlene Laughter, MD     [email or web address]           HISTORY: This is a 70 year old female who presents with episode of confusion and altered mental status. Study is being done to evaluate for seizures as a cause of these episodes.  MEDICATIONS: Scheduled Meds: . aspirin  325 mg Oral Daily  . buPROPion  150 mg Oral Daily  . cefTRIAXone (ROCEPHIN)  IV  1 g Intravenous Q24H  . cholecalciferol  1,000 Units Oral Daily  . docusate sodium  100 mg Oral BID  . enoxaparin (LOVENOX) injection  40 mg Subcutaneous Q24H  . famotidine  20 mg Oral BID  . feeding supplement (ENSURE ENLIVE)  237 mL Oral BID  . feeding supplement (GLUCERNA SHAKE)  237 mL Oral TID BM  . gabapentin  1,200 mg Oral QHS  . insulin aspart  0-20 Units Subcutaneous TID WC  . insulin aspart  0-5 Units Subcutaneous QHS  . insulin detemir  25 Units Subcutaneous QHS  . loratadine  10 mg Oral Daily  . metoprolol  50 mg Oral BID  . ondansetron  8 mg Oral Q6H  . pantoprazole  40 mg Oral BID  . polyethylene glycol  17 g Oral Daily  . potassium chloride SA  20 mEq Oral TID  . pravastatin  40 mg Oral QHS  . topiramate  25 mg Oral BID  . verapamil  240 mg Oral Q breakfast   Continuous Infusions: . sodium chloride 100 mL/hr at 08/26/15 0101   PRN Meds:.acetaminophen, oxyCODONE-acetaminophen, promethazine, senna-docusate, sodium phosphate  Prior to Admission medications   Medication Sig Start Date End Date Taking? Authorizing Provider  acetaminophen (TYLENOL) 325 MG tablet Take 2 tablets (650 mg total) by mouth every 6 (six) hours as needed for mild pain, moderate pain, fever or headache. 02/06/15  Yes [name], MD  aspirin EC 81 MG tablet Take 81 mg by mouth daily.   Yes Historical Provider, MD  buPROPion (WELLBUTRIN XL) 150 MG 24 hr tablet Take 150 mg by mouth daily.   Yes Historical Provider, MD  cholecalciferol (VITAMIN D) 1000 units tablet Take 1,000 Units by mouth daily.   Yes Historical  Provider, MD  docusate sodium (COLACE) 100 MG capsule Take 100 mg by mouth 2 (two) times daily.   Yes Historical Provider, MD  feeding supplement (ENSURE IMMUNE HEALTH) LIQD Take 237 mLs by mouth 2 (two) times daily.   Yes Historical Provider, MD  gabapentin (NEURONTIN) 600 MG tablet Take 1,200 mg by mouth at bedtime.   Yes Historical Provider, MD  guaiFENesin-dextromethorphan (ROBITUSSIN DM) 100-10 MG/5ML syrup Take 15 mLs by mouth every 6 (six) hours as needed for cough.   Yes Historical Provider, MD  insulin detemir (LEVEMIR) 100 UNIT/ML injection Inject 0.25 mLs (25 Units total) into the skin at bedtime. 02/06/15  Yes [name], MD  insulin lispro (HUMALOG KWIKPEN) 100 UNIT/ML KiwkPen Inject 3-20 Units into the skin 3 (three) times daily. Sliding Scale: <60--Call MD 121-150= 3 units 151-200= 4 units 201-250= 7 units 251-300=11units 301-350=15units 351-400=20units >400= Call MD   Yes Historical Provider, MD  insulin lispro (HUMALOG KWIKPEN) 100 UNIT/ML KiwkPen Inject 2-5 Units into the skin at bedtime. Per sliding scale: If blood sugar is less than 60, call MD. 201-250= 2 units 251-300= 3 units 301-350= 4 units 351-400= 5 units >400= Call MD   Yes Historical Provider, MD  metoprolol (LOPRESSOR) 50 MG tablet Take 1 tablet (50 mg total) by mouth  2 (two) times daily. 02/06/15  Yes [name], MD  Multiple Vitamins-Minerals (CENTRUM SILVER PO) Take 1 tablet by mouth daily.   Yes Historical Provider, MD  ondansetron (ZOFRAN) 8 MG tablet Take 1 tablet (8 mg total) by mouth 4 (four) times daily -  before meals and at bedtime. Patient taking differently: Take 8 mg by mouth every 6 (six) hours.  02/06/15  Yes [name], MD  oxyCODONE-acetaminophen (PERCOCET/ROXICET) 5-325 MG tablet Take 1 tablet by mouth every 6 (six) hours as needed. Patient taking differently: Take 1 tablet by mouth every 6 (six) hours as needed for moderate pain.  11/30/14  Yes Milton Ferguson, MD  pantoprazole  (PROTONIX) 40 MG tablet Take 1 tablet (40 mg total) by mouth 2 (two) times daily. 02/06/15  Yes [name], MD  polyethylene glycol [facility] / GLYCOLAX) packet Take 17 g by mouth daily.   Yes Historical Provider, MD  potassium chloride SA (K-DUR,KLOR-CON) 20 MEQ tablet Take 2 tablets (40 mEq total) by mouth daily. Patient taking differently: Take 20 mEq by mouth 2 (two) times daily.  02/06/15  Yes [name], MD  pravastatin (PRAVACHOL) 40 MG tablet Take 40 mg by mouth at bedtime.    Yes Historical Provider, MD  promethazine (PHENERGAN) 25 MG tablet Take 25 mg by mouth every 8 (eight) hours as needed for nausea or vomiting.    Yes Historical Provider, MD  topiramate (TOPAMAX) 25 MG tablet Take 25 mg by mouth 2 (two) times daily.    Yes Historical Provider, MD  verapamil (CALAN-SR) 240 MG CR tablet Take 240 mg by mouth daily with breakfast.  09/23/10  Yes Historical Provider, MD      ANALYSIS: A 16 channel recording using standard 10 20 measurements is conducted for 27 minutes. The background activity consists highest 7 Hz. Beta activity is observed in the frontal areas. Awake and drowsy activities are observed. Vertex sharp wave activity is observed. Photic stimulation and hyperventilation were not carried out. There is no focal or lateralized slowing. There is infrequent global delta slowing. No epileptiform activities are observed.   IMPRESSION:  This recording of the awake and drowsy state shows mild global slowing indicating a mild encephalopathy. However, there is no epileptiform activities observed.      Jaishawn Witzke A. Merlene Mahoney, M.D.  Diplomate, Tax adviser of Psychiatry and Neurology ( Neurology).

## 2015-08-26 NOTE — Progress Notes (Signed)
[place] A. Merlene Laughter, MD     [email or web address]          Lori Mahoney is an 70 y.o. female.   Assessment/Plan: 1. 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 Risk factors age, hypertension and diabetes.   2. Medication-induced parkinsonism.  3. Cognitive impairment likely due to vascular disease with increased risk long term of vascular-induced dementia.   4. Multifactorial gait impairment including previous infarct, current infarct and osteoarthritis.  5. Abnormal movements involving the left hand of unclear etiology possibly due to parkinsonism but concerned about possible seizures.  6. Severe nausea vomiting due to severe gastroparesis.  7. Headaches.  8. Neck pain with some evidence of torticollis on the left side.    Family reports that she is continuing to improve in terms of responsiveness, affect and speech. They have had a long discussion and the palliative care along with hospice will be consulted. They want to keep the patient comfortable with less intervention. Potential discharge back to the [facility] tomorrow.    RECOMMENDATION: Agree with aspirin 325. Physical and occupational therapies  Speech therapy Continue Topamax as this has helped her headaches. We'll sign off reconsult as needed.    GENERAL: Pleasant female who is in no acute distress. She appears to have some mild psychomotor slowing.  HEENT: Reduced range of motion to the left with moderate hypertrophy of the left sternomastoid muscle.  ABDOMEN: soft  EXTREMITIES: There is mild nonpitting edema of the feet and ankles. Marked arthritic changes of the knees. Status post bilateral knee arthroplasties. There is also arthritic changes of the hands.   BACK: Normal  SKIN: Normal by inspection.    MENTAL STATUS: She is awake and alert. She does have some difficulties following commands  at times. There is moderate dysarthria. She knows she is in the hospital she thinks that she is at [facility]. She states her age and the month correctly. The patient did well with naming simple and 2 word objects.  CRANIAL NERVES: Pupils are equal, round and reactive to light and accomodation; extra ocular movements are full, there is no significant nystagmus; visual fields are full; upper and lower facial muscles are normal in strength and symmetric, there is no flattening of the nasolabial folds; tongue is midline; uvula is midline; shoulder elevation is normal.  MOTOR: Left hemiplegia ( except mild flicker of the fingers). Right upper extremity graded as 4 minus/5. Right leg 3. Profound drift left upper extremity and left leg. Mild drift right upper extremity. Severe just right leg. Patient appears to have some neglect of the left side even over the profound weakness.  COORDINATION: Left finger to nose is normal, right finger to nose is normal, episodic twitching of the left hand. No dysmetria.      TTE  - Left ventricle: The cavity size was normal. Systolic function was   normal. Wall motion was normal; there were no regional wall   motion abnormalities. Doppler parameters are consistent with   abnormal left ventricular relaxation (grade 1 diastolic   dysfunction). - Mitral valve: There was mild regurgitation. - Left atrium: The atrium was mildly dilated. - Pericardium, extracardiac: A trivial pericardial effusion was   identified.  CAROTID DOPPLERS normal    Objective: Vital signs in last 24 hours: Temp:  [98.2 F (36.8 C)-98.9 F (37.2 C)] 98.9 F (37.2 C) (08/01 1612) Pulse Rate:  [52-73] 54 (08/01 1612) Resp:  [17-20]  17 (08/01 1612) BP: (158-182)/(63-81) 160/63 (08/01 1612) SpO2:  [96 %-98 %] 98 % (08/01 1612)  Intake/Output from previous day: 07/31 0701 - 08/01 0700 In: 720 [P.O.:720] Out: 3500 [Urine:3500] Intake/Output this shift: Total I/O In: -    Out: 1050 [Urine:1050] Nutritional status: Diet heart healthy/carb modified Room service appropriate? Yes; Fluid consistency: Thin   Lab Results: Results for orders placed or performed during the hospital encounter of 08/22/15 (from the past 48 hour(s))  Glucose, capillary     Status: Abnormal   Collection Time: 08/24/15  8:23 PM  Result Value Ref Range   Glucose-Capillary 151 (H) 65 - 99 mg/dL  Glucose, capillary     Status: Abnormal   Collection Time: 08/25/15  7:33 AM  Result Value Ref Range   Glucose-Capillary 162 (H) 65 - 99 mg/dL  Glucose, capillary     Status: Abnormal   Collection Time: 08/25/15 11:34 AM  Result Value Ref Range   Glucose-Capillary 125 (H) 65 - 99 mg/dL  Glucose, capillary     Status: Abnormal   Collection Time: 08/25/15  4:37 PM  Result Value Ref Range   Glucose-Capillary 187 (H) 65 - 99 mg/dL  Glucose, capillary     Status: Abnormal   Collection Time: 08/25/15  8:23 PM  Result Value Ref Range   Glucose-Capillary 119 (H) 65 - 99 mg/dL   Comment 1 Notify RN    Comment 2 Document in Chart   CBC with Differential/Platelet     Status: Abnormal   Collection Time: 08/26/15  5:58 AM  Result Value Ref Range   WBC 8.9 4.0 - 10.5 K/uL   RBC 3.68 (L) 3.87 - 5.11 MIL/uL   Hemoglobin 11.0 (L) 12.0 - 15.0 g/dL   HCT 32.6 (L) 36.0 - 46.0 %   MCV 88.6 78.0 - 100.0 fL   MCH 29.9 26.0 - 34.0 pg   MCHC 33.7 30.0 - 36.0 g/dL   RDW 13.1 11.5 - 15.5 %   Platelets 185 150 - 400 K/uL   Neutrophils Relative % 55 %   Neutro Abs 5.0 1.7 - 7.7 K/uL   Lymphocytes Relative 34 %   Lymphs Abs 3.0 0.7 - 4.0 K/uL   Monocytes Relative 6 %   Monocytes Absolute 0.5 0.1 - 1.0 K/uL   Eosinophils Relative 5 %   Eosinophils Absolute 0.4 0.0 - 0.7 K/uL   Basophils Relative 0 %   Basophils Absolute 0.0 0.0 - 0.1 K/uL  Basic metabolic panel     Status: Abnormal   Collection Time: 08/26/15  5:58 AM  Result Value Ref Range   Sodium 137 135 - 145 mmol/L   Potassium 3.4 (L) 3.5 - 5.1  mmol/L   Chloride 112 (H) 101 - 111 mmol/L   CO2 23 22 - 32 mmol/L   Glucose, Bld 182 (H) 65 - 99 mg/dL   BUN 15 6 - 20 mg/dL   Creatinine, Ser 0.90 0.44 - 1.00 mg/dL   Calcium 8.3 (L) 8.9 - 10.3 mg/dL   GFR calc non Af Amer >60 >60 mL/min   GFR calc Af Amer >60 >60 mL/min    Comment: (NOTE) The eGFR has been calculated using the CKD EPI equation. This calculation has not been validated in all clinical situations. eGFR's persistently <60 mL/min signify possible Chronic Kidney Disease.   Glucose, capillary     Status: Abnormal   Collection Time: 08/26/15  7:38 AM  Result Value Ref Range   Glucose-Capillary 166 (H) 65 -  99 mg/dL   Comment 1 Notify RN    Comment 2 Document in Chart   Glucose, capillary     Status: Abnormal   Collection Time: 08/26/15 11:21 AM  Result Value Ref Range   Glucose-Capillary 167 (H) 65 - 99 mg/dL   Comment 1 Notify RN    Comment 2 Document in Chart   Glucose, capillary     Status: Abnormal   Collection Time: 08/26/15  4:05 PM  Result Value Ref Range   Glucose-Capillary 132 (H) 65 - 99 mg/dL   Comment 1 Notify RN    Comment 2 Document in Chart     Lipid Panel No results for input(s): CHOL, TRIG, HDL, CHOLHDL, VLDL, LDLCALC in the last 72 hours.  Studies/Results:   Medications:  Scheduled Meds: . aspirin  325 mg Oral Daily  . buPROPion  150 mg Oral Daily  . cefTRIAXone (ROCEPHIN)  IV  1 g Intravenous Q24H  . cholecalciferol  1,000 Units Oral Daily  . docusate sodium  100 mg Oral BID  . enoxaparin (LOVENOX) injection  40 mg Subcutaneous Q24H  . famotidine  20 mg Oral BID  . feeding supplement (ENSURE ENLIVE)  237 mL Oral BID  . feeding supplement (GLUCERNA SHAKE)  237 mL Oral TID BM  . gabapentin  1,200 mg Oral QHS  . insulin aspart  0-20 Units Subcutaneous TID WC  . insulin aspart  0-5 Units Subcutaneous QHS  . insulin detemir  25 Units Subcutaneous QHS  . loratadine  10 mg Oral Daily  . metoprolol  50 mg Oral BID  . ondansetron  8 mg  Oral Q6H  . pantoprazole  40 mg Oral BID  . polyethylene glycol  17 g Oral Daily  . potassium chloride SA  20 mEq Oral TID  . pravastatin  40 mg Oral QHS  . topiramate  25 mg Oral BID  . verapamil  240 mg Oral Q breakfast   Continuous Infusions: . sodium chloride 100 mL/hr at 08/26/15 0101   PRN Meds:.acetaminophen, oxyCODONE-acetaminophen, promethazine, senna-docusate, sodium phosphate     LOS: 4 days      EEG   This recording of the awake and drowsy state shows mild global slowing indicating a mild encephalopathy. However, there is no epileptiform activities observed.       Tranice Laduke A. Merlene Laughter, M.D.  Diplomate, Tax adviser of Psychiatry and Neurology ( Neurology).

## 2015-08-27 ENCOUNTER — Inpatient Hospital Stay
Admission: RE | Admit: 2015-08-27 | Discharge: 2015-10-13 | Disposition: A | Discharge status: Hospice | Payer: Medicare Other | Source: Ambulatory Visit | Attending: Internal Medicine | Admitting: Internal Medicine

## 2015-08-27 LAB — GLUCOSE, CAPILLARY
Glucose-Capillary: 116 mg/dL — ABNORMAL HIGH (ref 65–99)
Glucose-Capillary: 183 mg/dL — ABNORMAL HIGH (ref 65–99)

## 2015-08-27 MED ORDER — ONDANSETRON HCL 8 MG PO TABS: 8.0000 mg | ORAL_TABLET | Freq: Four times a day (QID) | ORAL | Status: AC

## 2015-08-27 MED ORDER — POTASSIUM CHLORIDE CRYS ER 20 MEQ PO TBCR: 20.0000 meq | EXTENDED_RELEASE_TABLET | Freq: Two times a day (BID) | ORAL | Status: AC

## 2015-08-27 MED ORDER — ASPIRIN 325 MG PO TABS
325.0000 mg | ORAL_TABLET | Freq: Every day | ORAL | 12 refills | Status: AC
Start: 2015-08-27 — End: ?

## 2015-08-27 MED ORDER — LORATADINE 10 MG PO TABS: 10.0000 mg | ORAL_TABLET | Freq: Every day | ORAL | 12 refills | Status: AC

## 2015-08-27 NOTE — Progress Notes (Signed)
Patient discharging to [facility].  Report called and given to [facility].  Foley left in place per MD note and family request.  IV removed - WNL.  Patient and family aware pf plan and in agreement.  Will transfer via bed after lunch.  Patient in NAD at this time.

## 2015-08-27 NOTE — Progress Notes (Signed)
Subjective: She feels okay. Her itching is better. Her family has decided on a more palliative approach to her treatment. They do not want her readmitted to the hospital. She is ready for transfer to skilled care facility  Objective: Vital signs in last 24 hours: Temp:  [98.2 F (36.8 C)-98.9 F (37.2 C)] 98.9 F (37.2 C) (08/02 0755) Pulse Rate:  [52-69] 57 (08/02 0755) Resp:  [17-20] 18 (08/02 0755) BP: (149-179)/(63-77) 178/73 (08/02 0755) SpO2:  [92 %-99 %] 99 % (08/02 0755) Weight change:  Last BM Date: 08/26/15  Intake/Output from previous day: 08/01 0701 - 08/02 0700 In: 240 [P.O.:240] Out: 3200 [Urine:3200]  PHYSICAL EXAM General appearance: alert, cooperative, mild distress and With continued left hemiparesis Resp: clear to auscultation bilaterally Cardio: regular rate and rhythm, S1, S2 normal, no murmur, click, rub or gallop GI: soft, non-tender; bowel sounds normal; no masses,  no organomegaly Extremities: extremities normal, atraumatic, no cyanosis or edema  Lab Results:  Results for orders placed or performed during the hospital encounter of 08/22/15 (from the past 48 hour(s))  Glucose, capillary     Status: Abnormal   Collection Time: 08/25/15 11:34 AM  Result Value Ref Range   Glucose-Capillary 125 (H) 65 - 99 mg/dL  Glucose, capillary     Status: Abnormal   Collection Time: 08/25/15  4:37 PM  Result Value Ref Range   Glucose-Capillary 187 (H) 65 - 99 mg/dL  Glucose, capillary     Status: Abnormal   Collection Time: 08/25/15  8:23 PM  Result Value Ref Range   Glucose-Capillary 119 (H) 65 - 99 mg/dL   Comment 1 Notify RN    Comment 2 Document in Chart   CBC with Differential/Platelet     Status: Abnormal   Collection Time: 08/26/15  5:58 AM  Result Value Ref Range   WBC 8.9 4.0 - 10.5 K/uL   RBC 3.68 (L) 3.87 - 5.11 MIL/uL   Hemoglobin 11.0 (L) 12.0 - 15.0 g/dL   HCT 32.6 (L) 36.0 - 46.0 %   MCV 88.6 78.0 - 100.0 fL   MCH 29.9 26.0 - 34.0 pg   MCHC  33.7 30.0 - 36.0 g/dL   RDW 13.1 11.5 - 15.5 %   Platelets 185 150 - 400 K/uL   Neutrophils Relative % 55 %   Neutro Abs 5.0 1.7 - 7.7 K/uL   Lymphocytes Relative 34 %   Lymphs Abs 3.0 0.7 - 4.0 K/uL   Monocytes Relative 6 %   Monocytes Absolute 0.5 0.1 - 1.0 K/uL   Eosinophils Relative 5 %   Eosinophils Absolute 0.4 0.0 - 0.7 K/uL   Basophils Relative 0 %   Basophils Absolute 0.0 0.0 - 0.1 K/uL  Basic metabolic panel     Status: Abnormal   Collection Time: 08/26/15  5:58 AM  Result Value Ref Range   Sodium 137 135 - 145 mmol/L   Potassium 3.4 (L) 3.5 - 5.1 mmol/L   Chloride 112 (H) 101 - 111 mmol/L   CO2 23 22 - 32 mmol/L   Glucose, Bld 182 (H) 65 - 99 mg/dL   BUN 15 6 - 20 mg/dL   Creatinine, Ser 0.90 0.44 - 1.00 mg/dL   Calcium 8.3 (L) 8.9 - 10.3 mg/dL   GFR calc non Af Amer >60 >60 mL/min   GFR calc Af Amer >60 >60 mL/min    Comment: (NOTE) The eGFR has been calculated using the CKD EPI equation. This calculation has not been validated in  all clinical situations. eGFR's persistently <60 mL/min signify possible Chronic Kidney Disease.   Glucose, capillary     Status: Abnormal   Collection Time: 08/26/15  7:38 AM  Result Value Ref Range   Glucose-Capillary 166 (H) 65 - 99 mg/dL   Comment 1 Notify RN    Comment 2 Document in Chart   Glucose, capillary     Status: Abnormal   Collection Time: 08/26/15 11:21 AM  Result Value Ref Range   Glucose-Capillary 167 (H) 65 - 99 mg/dL   Comment 1 Notify RN    Comment 2 Document in Chart   Glucose, capillary     Status: Abnormal   Collection Time: 08/26/15  4:05 PM  Result Value Ref Range   Glucose-Capillary 132 (H) 65 - 99 mg/dL   Comment 1 Notify RN    Comment 2 Document in Chart   Glucose, capillary     Status: Abnormal   Collection Time: 08/26/15  7:59 PM  Result Value Ref Range   Glucose-Capillary 140 (H) 65 - 99 mg/dL   Comment 1 Notify RN    Comment 2 Document in Chart   Glucose, capillary     Status: Abnormal    Collection Time: 08/27/15  7:54 AM  Result Value Ref Range   Glucose-Capillary 116 (H) 65 - 99 mg/dL   Comment 1 Notify RN    Comment 2 Document in Chart     ABGS No results for input(s): PHART, PO2ART, TCO2, HCO3 in the last 72 hours.  Invalid input(s): PCO2 CULTURES Recent Results (from the past 240 hour(s))  Culture, Urine     Status: Abnormal   Collection Time: 08/22/15  9:00 AM  Result Value Ref Range Status   Specimen Description URINE, CATHETERIZED  Final   Special Requests NONE  Final   Culture >=100,000 COLONIES/mL ESCHERICHIA COLI (A)  Final   Report Status 08/26/2015 FINAL  Final   Organism ID, Bacteria ESCHERICHIA COLI (A)  Final      Susceptibility   Escherichia coli - MIC*    AMPICILLIN >=32 RESISTANT Resistant     CEFAZOLIN <=4 SENSITIVE Sensitive     CEFTRIAXONE <=1 SENSITIVE Sensitive     CIPROFLOXACIN <=0.25 SENSITIVE Sensitive     GENTAMICIN <=1 SENSITIVE Sensitive     IMIPENEM <=0.25 SENSITIVE Sensitive     NITROFURANTOIN <=16 SENSITIVE Sensitive     TRIMETH/SULFA <=20 SENSITIVE Sensitive     AMPICILLIN/SULBACTAM >=32 RESISTANT Resistant     PIP/TAZO <=4 SENSITIVE Sensitive     Extended ESBL NEGATIVE Sensitive     * >=100,000 COLONIES/mL ESCHERICHIA COLI  Urine culture     Status: Abnormal   Collection Time: 08/22/15  9:11 AM  Result Value Ref Range Status   Specimen Description URINE, CLEAN CATCH  Final   Special Requests NONE  Final   Culture 60,000 COLONIES/mL ESCHERICHIA COLI (A)  Final   Report Status 08/24/2015 FINAL  Final   Organism ID, Bacteria ESCHERICHIA COLI (A)  Final      Susceptibility   Escherichia coli - MIC*    AMPICILLIN >=32 RESISTANT Resistant     CEFAZOLIN <=4 SENSITIVE Sensitive     CEFTRIAXONE <=1 SENSITIVE Sensitive     CIPROFLOXACIN <=0.25 SENSITIVE Sensitive     GENTAMICIN <=1 SENSITIVE Sensitive     IMIPENEM <=0.25 SENSITIVE Sensitive     NITROFURANTOIN <=16 SENSITIVE Sensitive     TRIMETH/SULFA <=20 SENSITIVE  Sensitive     AMPICILLIN/SULBACTAM 16 INTERMEDIATE Intermediate  PIP/TAZO <=4 SENSITIVE Sensitive     * 60,000 COLONIES/mL ESCHERICHIA COLI   Studies/Results: No results found.  Medications:  Prior to Admission:  Prescriptions Prior to Admission  Medication Sig Dispense Refill Last Dose  . acetaminophen (TYLENOL) 325 MG tablet Take 2 tablets (650 mg total) by mouth every 6 (six) hours as needed for mild pain, moderate pain, fever or headache. 60 tablet 5 08/18/2015 at Unknown time  . aspirin EC 81 MG tablet Take 81 mg by mouth daily.   08/21/2015 at 0900  . buPROPion (WELLBUTRIN XL) 150 MG 24 hr tablet Take 150 mg by mouth daily.   08/21/2015 at Unknown time  . cholecalciferol (VITAMIN D) 1000 units tablet Take 1,000 Units by mouth daily.   08/21/2015 at Unknown time  . docusate sodium (COLACE) 100 MG capsule Take 100 mg by mouth 2 (two) times daily.   08/21/2015 at Unknown time  . feeding supplement (ENSURE IMMUNE HEALTH) LIQD Take 237 mLs by mouth 2 (two) times daily.   08/21/2015 at Unknown time  . gabapentin (NEURONTIN) 600 MG tablet Take 1,200 mg by mouth at bedtime.   08/21/2015 at Unknown time  . guaiFENesin-dextromethorphan (ROBITUSSIN DM) 100-10 MG/5ML syrup Take 15 mLs by mouth every 6 (six) hours as needed for cough.   unknown  . insulin detemir (LEVEMIR) 100 UNIT/ML injection Inject 0.25 mLs (25 Units total) into the skin at bedtime. 10 mL 11 08/21/2015 at Unknown time  . insulin lispro (HUMALOG KWIKPEN) 100 UNIT/ML KiwkPen Inject 3-20 Units into the skin 3 (three) times daily. Sliding Scale: <60--Call MD 121-150= 3 units 151-200= 4 units 201-250= 7 units 251-300=11units 301-350=15units 351-400=20units >400= Call MD   08/22/2015 at Unknown time  . insulin lispro (HUMALOG KWIKPEN) 100 UNIT/ML KiwkPen Inject 2-5 Units into the skin at bedtime. Per sliding scale: If blood sugar is less than 60, call MD. 201-250= 2 units 251-300= 3 units 301-350= 4 units 351-400= 5 units >400=  Call MD   08/21/2015 at Unknown time  . metoprolol (LOPRESSOR) 50 MG tablet Take 1 tablet (50 mg total) by mouth 2 (two) times daily.   08/21/2015 at 0839  . Multiple Vitamins-Minerals (CENTRUM SILVER PO) Take 1 tablet by mouth daily.   08/21/2015 at Unknown time  . ondansetron (ZOFRAN) 8 MG tablet Take 1 tablet (8 mg total) by mouth 4 (four) times daily -  before meals and at bedtime. (Patient taking differently: Take 8 mg by mouth every 6 (six) hours. ) 20 tablet 0 08/22/2015 at 0546  . oxyCODONE-acetaminophen (PERCOCET/ROXICET) 5-325 MG tablet Take 1 tablet by mouth every 6 (six) hours as needed. (Patient taking differently: Take 1 tablet by mouth every 6 (six) hours as needed for moderate pain. ) 30 tablet 0 08/21/2015 at Unknown time  . pantoprazole (PROTONIX) 40 MG tablet Take 1 tablet (40 mg total) by mouth 2 (two) times daily. 60 tablet 12 08/21/2015 at Unknown time  . polyethylene glycol (MIRALAX / GLYCOLAX) packet Take 17 g by mouth daily.   08/21/2015 at Unknown time  . potassium chloride SA (K-DUR,KLOR-CON) 20 MEQ tablet Take 2 tablets (40 mEq total) by mouth daily. (Patient taking differently: Take 20 mEq by mouth 2 (two) times daily. )   08/21/2015 at Unknown time  . pravastatin (PRAVACHOL) 40 MG tablet Take 40 mg by mouth at bedtime.    08/21/2015 at Unknown time  . promethazine (PHENERGAN) 25 MG tablet Take 25 mg by mouth every 8 (eight) hours as needed for nausea or  vomiting.    08/18/2015 at Unknown time  . topiramate (TOPAMAX) 25 MG tablet Take 25 mg by mouth 2 (two) times daily.    08/21/2015 at Unknown time  . verapamil (CALAN-SR) 240 MG CR tablet Take 240 mg by mouth daily with breakfast.    08/21/2015 at Unknown time  . [DISCONTINUED] lidocaine (LIDODERM) 5 % Place 1 patch onto the skin daily. Remove & Discard patch within 12 hours or as directed by MD (Patient not taking: Reported on 11/30/2014) 30 patch 1 Not Taking at Unknown time  . [DISCONTINUED] metoCLOPramide (REGLAN) 10 MG tablet Take  1 tablet (10 mg total) by mouth 4 (four) times daily -  before meals and at bedtime. (Patient not taking: Reported on 03/18/2015)   Not Taking at Unknown time  . [DISCONTINUED] ondansetron (ZOFRAN) 4 MG tablet Take 1 tablet (4 mg total) by mouth every 6 (six) hours as needed for nausea. (Patient not taking: Reported on 08/22/2015) 20 tablet 0 Not Taking at Unknown time   Scheduled: . aspirin  325 mg Oral Daily  . buPROPion  150 mg Oral Daily  . cefTRIAXone (ROCEPHIN)  IV  1 g Intravenous Q24H  . cholecalciferol  1,000 Units Oral Daily  . docusate sodium  100 mg Oral BID  . enoxaparin (LOVENOX) injection  40 mg Subcutaneous Q24H  . famotidine  20 mg Oral BID  . feeding supplement (ENSURE ENLIVE)  237 mL Oral BID  . feeding supplement (GLUCERNA SHAKE)  237 mL Oral TID BM  . gabapentin  1,200 mg Oral QHS  . insulin aspart  0-20 Units Subcutaneous TID WC  . insulin aspart  0-5 Units Subcutaneous QHS  . insulin detemir  25 Units Subcutaneous QHS  . loratadine  10 mg Oral Daily  . metoprolol  50 mg Oral BID  . ondansetron  8 mg Oral Q6H  . pantoprazole  40 mg Oral BID  . polyethylene glycol  17 g Oral Daily  . potassium chloride SA  20 mEq Oral TID  . pravastatin  40 mg Oral QHS  . topiramate  25 mg Oral BID  . verapamil  240 mg Oral Q breakfast   Continuous: . sodium chloride 100 mL/hr at 08/26/15 2102   TRZ:NBVAPOLIDCVUD, oxyCODONE-acetaminophen, promethazine, senna-docusate, sodium phosphate  Assesment: She was admitted with a stroke. She had acute kidney injury and that's better. She has a urinary tract infection. She is ready for transfer back to the skilled care facility Active Problems:   Essential hypertension   Urinary tract infectious disease   Dyslipidemia   Gastroparesis   Dehydration   CVA (cerebral infarction)   AKI (acute kidney injury) ([place])   Left-sided weakness   Cerebrovascular accident (CVA) due to occlusion of carotid artery [facility])   Palliative care encounter    Goals of care, counseling/discussion   DNR (do not resuscitate) discussion    Plan: Discharge to skilled care facility    LOS: 5 days   Erroll Wilbourne L 08/27/2015, 9:16 AM

## 2015-08-27 NOTE — Discharge Summary (Signed)
Physician Discharge Summary  Patient ID: Lori Mahoney MRN: [id number] DOB/AGE: 30-Apr-1945 70 y.o. Primary Care Physician:Duard Spiewak L, MD Admit date: 08/22/2015 Discharge date: 08/27/2015    Discharge Diagnoses:   Active Problems:   Essential hypertension   Urinary tract infectious disease   Dyslipidemia   Gastroparesis   Dehydration   CVA (cerebral infarction)   AKI (acute kidney injury) ([place])   Left-sided weakness   Cerebrovascular accident (CVA) due to occlusion of carotid artery ([place])   Palliative care encounter   Goals of care, counseling/discussion   DNR (do not resuscitate) discussion     Medication List    STOP taking these medications   aspirin EC 81 MG tablet Replaced by:  aspirin 325 MG tablet     TAKE these medications   acetaminophen 325 MG tablet Commonly known as:  TYLENOL Take 2 tablets (650 mg total) by mouth every 6 (six) hours as needed for mild pain, moderate pain, fever or headache.   aspirin 325 MG tablet Take 1 tablet (325 mg total) by mouth daily. Replaces:  aspirin EC 81 MG tablet   buPROPion 150 MG 24 hr tablet Commonly known as:  WELLBUTRIN XL Take 150 mg by mouth daily.   CENTRUM SILVER PO Take 1 tablet by mouth daily.   cholecalciferol 1000 units tablet Commonly known as:  VITAMIN D Take 1,000 Units by mouth daily.   docusate sodium 100 MG capsule Commonly known as:  COLACE Take 100 mg by mouth 2 (two) times daily.   feeding supplement Liqd Take 237 mLs by mouth 2 (two) times daily.   gabapentin 600 MG tablet Commonly known as:  NEURONTIN Take 1,200 mg by mouth at bedtime.   guaiFENesin-dextromethorphan 100-10 MG/5ML syrup Commonly known as:  ROBITUSSIN DM Take 15 mLs by mouth every 6 (six) hours as needed for cough.   HUMALOG KWIKPEN 100 UNIT/ML KiwkPen Generic drug:  insulin lispro Inject 3-20 Units into the skin 3 (three) times daily. Sliding Scale: <60--Call MD 121-150= 3 units 151-200= 4 units 201-250= 7 units  251-300=11units 301-350=15units 351-400=20units >400= Call MD   HUMALOG KWIKPEN 100 UNIT/ML KiwkPen Generic drug:  insulin lispro Inject 2-5 Units into the skin at bedtime. Per sliding scale: If blood sugar is less than 60, call MD. 201-250= 2 units 251-300= 3 units 301-350= 4 units 351-400= 5 units >400= Call MD   insulin detemir 100 UNIT/ML injection Commonly known as:  LEVEMIR Inject 0.25 mLs (25 Units total) into the skin at bedtime.   loratadine 10 MG tablet Commonly known as:  CLARITIN Take 1 tablet (10 mg total) by mouth daily.   metoprolol 50 MG tablet Commonly known as:  LOPRESSOR Take 1 tablet (50 mg total) by mouth 2 (two) times daily.   ondansetron 8 MG tablet Commonly known as:  ZOFRAN Take 1 tablet (8 mg total) by mouth every 6 (six) hours.   oxyCODONE-acetaminophen 5-325 MG tablet Commonly known as:  PERCOCET/ROXICET Take 1 tablet by mouth every 6 (six) hours as needed. What changed:  reasons to take this   pantoprazole 40 MG tablet Commonly known as:  PROTONIX Take 1 tablet (40 mg total) by mouth 2 (two) times daily.   polyethylene glycol packet Commonly known as:  MIRALAX / GLYCOLAX Take 17 g by mouth daily.   potassium chloride SA 20 MEQ tablet Commonly known as:  K-DUR,KLOR-CON Take 1 tablet (20 mEq total) by mouth 2 (two) times daily.   pravastatin 40 MG tablet Commonly known as:  PRAVACHOL  Take 40 mg by mouth at bedtime.   promethazine 25 MG tablet Commonly known as:  PHENERGAN Take 25 mg by mouth every 8 (eight) hours as needed for nausea or vomiting.   topiramate 25 MG tablet Commonly known as:  TOPAMAX Take 25 mg by mouth 2 (two) times daily.   verapamil 240 MG CR tablet Commonly known as:  CALAN-SR Take 240 mg by mouth daily with breakfast.       Discharged Condition:Improved    Consults: Neurology/palliative care  Significant Diagnostic Studies: US Carotid Bilateral  Result Date: 08/23/2015 CLINICAL DATA:  Acute CVA with  left-sided weakness, dysphagia and altered mental status. History of hypertension, hyperlipidemia and diabetes. EXAM: BILATERAL CAROTID DUPLEX ULTRASOUND TECHNIQUE: Pearline Cables scale imaging, color Doppler and duplex ultrasound were performed of bilateral carotid and vertebral arteries in the neck. COMPARISON:  Head CT -08/22/2015 FINDINGS: Criteria: Quantification of carotid stenosis is based on velocity parameters that correlate the residual internal carotid diameter with NASCET-based stenosis levels, using the diameter of the distal internal carotid lumen as the denominator for stenosis measurement. The following velocity measurements were obtained: RIGHT ICA:  87/17 cm/sec CCA:  [id number] cm/sec SYSTOLIC ICA/CCA RATIO:  1.2 DIASTOLIC ICA/CCA RATIO:  1.3 ECA:  118 cm/sec LEFT ICA:  98/28 cm/sec CCA:  99991111 cm/sec SYSTOLIC ICA/CCA RATIO:  1.1 DIASTOLIC ICA/CCA RATIO:  1.2 ECA:  146 cm/sec RIGHT CAROTID ARTERY: There is a minimal amount of scattered eccentric mixed echogenic plaque throughout the right common carotid artery. There is a minimal amount of eccentric mixed echogenic plaque involving the origin and proximal aspects of the right internal carotid artery (image 24), not resulting in elevated peak systolic velocities within the interrogated course the right internal carotid artery to suggest a hemodynamically significant stenosis. RIGHT VERTEBRAL ARTERY:  Antegrade Flow LEFT CAROTID ARTERY: There is a minimal amount of intimal thickening throughout the left common carotid artery (representative image 45). There are no elevated peak systolic velocities within the interrogated course of the left internal carotid artery to suggest a hemodynamically significant stenosis. LEFT VERTEBRAL ARTERY:  Antegrade Flow IMPRESSION: Minimal amount of bilateral intimal thickening and atherosclerotic plaque, right greater than left, not resulting in a hemodynamically significant stenosis within either internal carotid artery.  Electronically Signed   By: Sandi Mariscal M.D.   On: 08/23/2015 11:40  Ct Head Code Stroke W/o Cm  Result Date: 08/22/2015 CLINICAL DATA:  Aphasia.  Left-sided weakness. EXAM: CT HEAD WITHOUT CONTRAST TECHNIQUE: Contiguous axial images were obtained from the base of the skull through the vertex without intravenous contrast. COMPARISON:  02/18/2015 FINDINGS: There is a wedge shaped hypoattenuated area within the right frontal lobe, likely representing an acute infarction. A smaller area of hypoattenuation is seen in the posterior left frontal lobe, which may also represent an age-indeterminate infarct. No mass effect or midline shift. No evidence of acute intracranial hemorrhage. No abnormal extra-axial fluid collections. There is a stable moderate to severe brain parenchymal volume loss and advanced periventricular white matter disease. There may be prior bilateral basal ganglia lacunar infarcts. Basal cisterns are preserved. No depressed skull fractures. Visualized paranasal sinuses and mastoid air cells are not opacified. IMPRESSION: 1. Acute right frontal lobe infarct, likely within right ACA territory. No evidence of hemorrhagic transformation. 2. Probable small age-indeterminate left frontal infarct. 3. Advanced brain parenchymal atrophy, and chronic microvascular disease. 4. Probable bilateral basal ganglia old lacunar infarcts. Critical Value/emergent results were called by telephone at the time of interpretation on 08/22/2015 at 8:35 am  to Dr. Ezequiel Essex , who verbally acknowledged these results. Electronically Signed   By: Fidela Salisbury M.D.   On: 08/22/2015 08:39   Lab Results: Basic Metabolic Panel:  Recent Labs  08/26/15 0558  NA 137  K 3.4*  CL 112*  CO2 23  GLUCOSE 182*  BUN 15  CREATININE 0.90  CALCIUM 8.3*   Liver Function Tests: No results for input(s): AST, ALT, ALKPHOS, BILITOT, PROT, ALBUMIN in the last 72 hours.   CBC:  Recent Labs  08/26/15 0558  WBC 8.9   NEUTROABS 5.0  HGB 11.0*  HCT 32.6*  MCV 88.6  PLT 185    Recent Results (from the past 240 hour(s))  Culture, Urine     Status: Abnormal   Collection Time: 08/22/15  9:00 AM  Result Value Ref Range Status   Specimen Description URINE, CATHETERIZED  Final   Special Requests NONE  Final   Culture >=100,000 COLONIES/mL ESCHERICHIA COLI (A)  Final   Report Status 08/26/2015 FINAL  Final   Organism ID, Bacteria ESCHERICHIA COLI (A)  Final      Susceptibility   Escherichia coli - MIC*    AMPICILLIN >=32 RESISTANT Resistant     CEFAZOLIN <=4 SENSITIVE Sensitive     CEFTRIAXONE <=1 SENSITIVE Sensitive     CIPROFLOXACIN <=0.25 SENSITIVE Sensitive     GENTAMICIN <=1 SENSITIVE Sensitive     IMIPENEM <=0.25 SENSITIVE Sensitive     NITROFURANTOIN <=16 SENSITIVE Sensitive     TRIMETH/SULFA <=20 SENSITIVE Sensitive     AMPICILLIN/SULBACTAM >=32 RESISTANT Resistant     PIP/TAZO <=4 SENSITIVE Sensitive     Extended ESBL NEGATIVE Sensitive     * >=100,000 COLONIES/mL ESCHERICHIA COLI  Urine culture     Status: Abnormal   Collection Time: 08/22/15  9:11 AM  Result Value Ref Range Status   Specimen Description URINE, CLEAN CATCH  Final   Special Requests NONE  Final   Culture 60,000 COLONIES/mL ESCHERICHIA COLI (A)  Final   Report Status 08/24/2015 FINAL  Final   Organism ID, Bacteria ESCHERICHIA COLI (A)  Final      Susceptibility   Escherichia coli - MIC*    AMPICILLIN >=32 RESISTANT Resistant     CEFAZOLIN <=4 SENSITIVE Sensitive     CEFTRIAXONE <=1 SENSITIVE Sensitive     CIPROFLOXACIN <=0.25 SENSITIVE Sensitive     GENTAMICIN <=1 SENSITIVE Sensitive     IMIPENEM <=0.25 SENSITIVE Sensitive     NITROFURANTOIN <=16 SENSITIVE Sensitive     TRIMETH/SULFA <=20 SENSITIVE Sensitive     AMPICILLIN/SULBACTAM 16 INTERMEDIATE Intermediate     PIP/TAZO <=4 SENSITIVE Sensitive     * 60,000 COLONIES/mL ESCHERICHIA COLI     Hospital Course: She came from the skilled care facility with what  appeared to be a stroke. She also had a urinary tract infection which should be treated now. She has received IV antibiotics for 5 days. She really made no improvement with her stroke. She appeared to have acute kidney injury and that has improved. She is overall about the same. Palliative care consultation was obtained and her family wants to take a more palliative approach to her care and does not want her to be readmitted. She had Foley catheter placed during this admission and I think since she is going to be more typically palliative that can be left in place. She can have PT OT and speech as needed because of her stroke.  Discharge Exam: Blood pressure (!) 178/73, pulse Marland Kitchen)  57, temperature 98.9 F (37.2 C), temperature source Oral, resp. rate 18, height 5\' 3"  (1.6 m), weight 82.9 kg (182 lb 12.2 oz), SpO2 99 %. She is awake and alert. Her speech is slurred. She has left hemiparesis.  Disposition: Back to skilled care facility  Discharge Instructions    Discharge to SNF when bed available    Complete by:  As directed        Signed: Arryanna Holquin L   08/27/2015, 9:23 AM

## 2015-08-27 NOTE — Clinical Social Work Note (Signed)
Pt d/c today back to [facility]. Pt's daughter, Levada Dy and facility aware and agreeable. Will transfer with staff.  Benay Pike, [place]

## 2015-09-06 NOTE — H&P (Signed)
Lori Mahoney MRN: [id number] DOB/AGE: 1945-07-27 70 y.o. Primary Care Physician:Keymora Grillot L, MD Admit date: 08/27/2015 Chief Complaint: Stroke HPI: This is a 70 year old who was in the hospital with a stroke. She had a previous stroke some years ago. She has been left with left hemiparesis. She said some speech problems. She says she has a little more use of her left hand.  Past Medical History:  Diagnosis Date  . Anemia    borderline anemia   . Arthritis    hands, hips, back   . Cancer ([place])   . Complication of anesthesia   . Depression   . Diabetes mellitus   . Difficulty walking   . Disc degeneration, lumbar   . Gastroparesis   . GERD (gastroesophageal reflux disease)   . H/O exercise stress test    good result- 2003.  Also had Echocardiogram 10 yrs. ago, told that valve was thickening, but no need for changes , no need for f/u  . H/O hiatal hernia   . Headache   . Hyperlipidemia   . Hypertension   . Neuromuscular disorder ([facility])    neuropathy- legs & feet   . Neuropathy ([place])   . PONV (postoperative nausea and vomiting)   . Shortness of breath   . Spinal stenosis   . Stroke ([place])   . UTI (lower urinary tract infection)    Past Surgical History:  Procedure Laterality Date  . ABDOMINAL HYSTERECTOMY    . BACK SURGERY  02/2009   L3-S1 fusion  . COLONOSCOPY    . ESOPHAGOGASTRODUODENOSCOPY N/A 02/05/2015   Procedure: ESOPHAGOGASTRODUODENOSCOPY (EGD);  Surgeon: [name], MD;  Location: [facility];  Service: Endoscopy;  Laterality: N/A;  . EYE SURGERY     cataracts removed- R eye    . JOINT REPLACEMENT  2005   left  . KNEE SURGERY    . STAPEDECTOMY     bilateral, then a 2nd time to R ear  . UPPER GASTROINTESTINAL ENDOSCOPY          Family History  Problem Relation Age of Onset  . Colon cancer Mother     age 35  . Breast cancer Sister   . Diabetes Brother   . Arthritis    . Cancer Brother     prostate    Social History:  reports that she has  never smoked. She has never used smokeless tobacco. She reports that she does not drink alcohol or use drugs.   Allergies:  Allergies  Allergen Reactions  . Codeine Nausea And Vomiting  . Hydrocodone-Acetaminophen Nausea And Vomiting  . Meperidine Hcl Nausea And Vomiting  . Contrast Media [Iodinated Diagnostic Agents] Nausea And Vomiting  . Oxycodone Nausea And Vomiting  . Tetracyclines & Related Rash    Medications Prior to Admission  Medication Sig Dispense Refill  . acetaminophen (TYLENOL) 325 MG tablet Take 2 tablets (650 mg total) by mouth every 6 (six) hours as needed for mild pain, moderate pain, fever or headache. 60 tablet 5  . aspirin 325 MG tablet Take 1 tablet (325 mg total) by mouth daily. 30 tablet 12  . buPROPion (WELLBUTRIN XL) 150 MG 24 hr tablet Take 150 mg by mouth daily.    . cholecalciferol (VITAMIN D) 1000 units tablet Take 1,000 Units by mouth daily.    Marland Kitchen docusate sodium (COLACE) 100 MG capsule Take 100 mg by mouth 2 (two) times daily.    . feeding supplement (ENSURE IMMUNE HEALTH) LIQD Take 237 mLs by  mouth 2 (two) times daily.    Marland Kitchen gabapentin (NEURONTIN) 600 MG tablet Take 1,200 mg by mouth at bedtime.    Marland Kitchen guaiFENesin-dextromethorphan (ROBITUSSIN DM) 100-10 MG/5ML syrup Take 15 mLs by mouth every 6 (six) hours as needed for cough.    . insulin detemir (LEVEMIR) 100 UNIT/ML injection Inject 0.25 mLs (25 Units total) into the skin at bedtime. 10 mL 11  . insulin lispro (HUMALOG KWIKPEN) 100 UNIT/ML KiwkPen Inject 3-20 Units into the skin 3 (three) times daily. Sliding Scale: <60--Call MD 121-150= 3 units 151-200= 4 units 201-250= 7 units 251-300=11units 301-350=15units 351-400=20units >400= Call MD    . insulin lispro (HUMALOG KWIKPEN) 100 UNIT/ML KiwkPen Inject 2-5 Units into the skin at bedtime. Per sliding scale: If blood sugar is less than 60, call MD. 201-250= 2 units 251-300= 3 units 301-350= 4 units 351-400= 5 units >400= Call MD    . loratadine  (CLARITIN) 10 MG tablet Take 1 tablet (10 mg total) by mouth daily. 30 tablet 12  . metoprolol (LOPRESSOR) 50 MG tablet Take 1 tablet (50 mg total) by mouth 2 (two) times daily.    . Multiple Vitamins-Minerals (CENTRUM SILVER PO) Take 1 tablet by mouth daily.    . ondansetron (ZOFRAN) 8 MG tablet Take 1 tablet (8 mg total) by mouth every 6 (six) hours.    Marland Kitchen oxyCODONE-acetaminophen (PERCOCET/ROXICET) 5-325 MG tablet Take 1 tablet by mouth every 6 (six) hours as needed. (Patient taking differently: Take 1 tablet by mouth every 6 (six) hours as needed for moderate pain. ) 30 tablet 0  . pantoprazole (PROTONIX) 40 MG tablet Take 1 tablet (40 mg total) by mouth 2 (two) times daily. 60 tablet 12  . polyethylene glycol (MIRALAX / GLYCOLAX) packet Take 17 g by mouth daily.    . potassium chloride SA (K-DUR,KLOR-CON) 20 MEQ tablet Take 1 tablet (20 mEq total) by mouth 2 (two) times daily.    . pravastatin (PRAVACHOL) 40 MG tablet Take 40 mg by mouth at bedtime.     . promethazine (PHENERGAN) 25 MG tablet Take 25 mg by mouth every 8 (eight) hours as needed for nausea or vomiting.     . topiramate (TOPAMAX) 25 MG tablet Take 25 mg by mouth 2 (two) times daily.     . verapamil (CALAN-SR) 240 MG CR tablet Take 240 mg by mouth daily with breakfast.          [id number] from the symptoms mentioned above,there are no other symptoms referable to all systems reviewed.  Physical Exam: There were no vitals taken for this visit. She is awake and alert. Speech mildly slurred. She is obese. Her pupils react. Nose and throat are clear. She has a little bit of facial asymmetry. Her neck is supple without masses. Her chest is clear. Her heart is regular without gallop. Her abdomen is soft. She has left hemiparesis but does have some grip on her left hand which is better than when she left the hospital   No results for input(s): WBC, NEUTROABS, HGB, HCT, MCV, PLT in the last 72 hours. No results for input(s): NA, K, CL,  CO2, GLUCOSE, BUN, CREATININE, CALCIUM, MG in the last 72 hours.  Invalid input(s): PHOlablast2(ast:2,ALT:2,alkphos:2,bilitot:2,prot:2,albumin:2)@    No results found for this or any previous visit (from the past 240 hour(s)).   US Carotid Bilateral  Result Date: 08/23/2015 CLINICAL DATA:  Acute CVA with left-sided weakness, dysphagia and altered mental status. History of hypertension, hyperlipidemia and diabetes. EXAM: BILATERAL CAROTID  DUPLEX ULTRASOUND TECHNIQUE: Pearline Cables scale imaging, color Doppler and duplex ultrasound were performed of bilateral carotid and vertebral arteries in the neck. COMPARISON:  Head CT -08/22/2015 FINDINGS: Criteria: Quantification of carotid stenosis is based on velocity parameters that correlate the residual internal carotid diameter with NASCET-based stenosis levels, using the diameter of the distal internal carotid lumen as the denominator for stenosis measurement. The following velocity measurements were obtained: RIGHT ICA:  87/17 cm/sec CCA:  [id number] cm/sec SYSTOLIC ICA/CCA RATIO:  1.2 DIASTOLIC ICA/CCA RATIO:  1.3 ECA:  118 cm/sec LEFT ICA:  98/28 cm/sec CCA:  99991111 cm/sec SYSTOLIC ICA/CCA RATIO:  1.1 DIASTOLIC ICA/CCA RATIO:  1.2 ECA:  146 cm/sec RIGHT CAROTID ARTERY: There is a minimal amount of scattered eccentric mixed echogenic plaque throughout the right common carotid artery. There is a minimal amount of eccentric mixed echogenic plaque involving the origin and proximal aspects of the right internal carotid artery (image 24), not resulting in elevated peak systolic velocities within the interrogated course the right internal carotid artery to suggest a hemodynamically significant stenosis. RIGHT VERTEBRAL ARTERY:  Antegrade Flow LEFT CAROTID ARTERY: There is a minimal amount of intimal thickening throughout the left common carotid artery (representative image 45). There are no elevated peak systolic velocities within the interrogated course of the left internal  carotid artery to suggest a hemodynamically significant stenosis. LEFT VERTEBRAL ARTERY:  Antegrade Flow IMPRESSION: Minimal amount of bilateral intimal thickening and atherosclerotic plaque, right greater than left, not resulting in a hemodynamically significant stenosis within either internal carotid artery. Electronically Signed   By: Sandi Mariscal M.D.   On: 08/23/2015 11:40  Ct Head Code Stroke W/o Cm  Result Date: 08/22/2015 CLINICAL DATA:  Aphasia.  Left-sided weakness. EXAM: CT HEAD WITHOUT CONTRAST TECHNIQUE: Contiguous axial images were obtained from the base of the skull through the vertex without intravenous contrast. COMPARISON:  02/18/2015 FINDINGS: There is a wedge shaped hypoattenuated area within the right frontal lobe, likely representing an acute infarction. A smaller area of hypoattenuation is seen in the posterior left frontal lobe, which may also represent an age-indeterminate infarct. No mass effect or midline shift. No evidence of acute intracranial hemorrhage. No abnormal extra-axial fluid collections. There is a stable moderate to severe brain parenchymal volume loss and advanced periventricular white matter disease. There may be prior bilateral basal ganglia lacunar infarcts. Basal cisterns are preserved. No depressed skull fractures. Visualized paranasal sinuses and mastoid air cells are not opacified. IMPRESSION: 1. Acute right frontal lobe infarct, likely within right ACA territory. No evidence of hemorrhagic transformation. 2. Probable small age-indeterminate left frontal infarct. 3. Advanced brain parenchymal atrophy, and chronic microvascular disease. 4. Probable bilateral basal ganglia old lacunar infarcts. Critical Value/emergent results were called by telephone at the time of interpretation on 08/22/2015 at 8:35 am to Dr. Ezequiel Essex , who verbally acknowledged these results. Electronically Signed   By: Fidela Salisbury M.D.   On: 08/22/2015 08:39  Impression: She has  had a stroke. At her last hospitalization we had extensive conversations with the patient and her family and she has requested a more palliative approach. She does not want to be rehospitalized Active Problems:   * No active hospital problems. *     Plan: Continue with treatment for stroke but with more palliative approach      Covey Baller L   09/06/2015, 11:00 AM

## 2015-09-08 NOTE — Discharge Summary (Signed)
NAME:  Lori Mahoney, Lori Mahoney                 ACCOUNT NO.:  [id number]  MEDICAL RECORD NO.:  [id number]  LOCATION:                                 FACILITY:  PHYSICIAN:  Kerly Rigsbee L. Luan Pulling, M.D.DATE OF BIRTH:  [birth year]  DATE OF ADMISSION:  08/27/2015 DATE OF DISCHARGE:  LH                              DISCHARGE SUMMARY   ADDENDUM:  DISCHARGE DIAGNOSES:  Metabolic encephalopathy from medical illness.     Karelly Dewalt L. Luan Pulling, M.D.   ______________________________ Jasper Loser. Luan Pulling, M.D.    EL[facility]/MEDQ  D:  09/06/2015  T:  09/06/2015  Job:  [id number]

## 2015-10-13 NOTE — Progress Notes (Signed)
This is documentation of my visit at the skilled care facility for 10/12/2015. When I came into her room her daughter was at bedside and Lori Mahoney appeared to be very uncomfortable occasionally moaning. She grabs at her right leg. She's not eating very much. She is on hospice care.  She has her eyes closed. She occasionally moans. She grabs at her right leg. She has left hemiparesis.  She has had complications of strokes. She has severe problems with gastroparesis. She has told her family that she does not want treatment other than comfort care and we have initiated hospice care. I think she needs a higher dose or more frequent dosing of her pain medication.  I change the dose of her pain meds. This is scheduled. Discontinue Accu-Cheks

## 2015-11-26 DEATH — deceased

## 2017-02-13 IMAGING — CT CT ABD-PELV W/O CM
2 of 4 series · 17 of 46 positions shown, 19 images · non-contrast
Comparison: 08/06/2013

CLINICAL DATA: Intractable nausea and vomiting. Gastroparesis.
Diabetes.

EXAM:
CT ABDOMEN AND PELVIS WITHOUT CONTRAST
TECHNIQUE: Multidetector CT imaging of the abdomen and pelvis was performed
following the standard protocol without IV contrast.

[Series 2: abdomen/pelvis w/o contrast · axial · non-contrast · 0.82mm/px · z∈[-384,+12]mm · 14 of 87 slices shown, 16 images]
[im 4/87  soft-tissue]
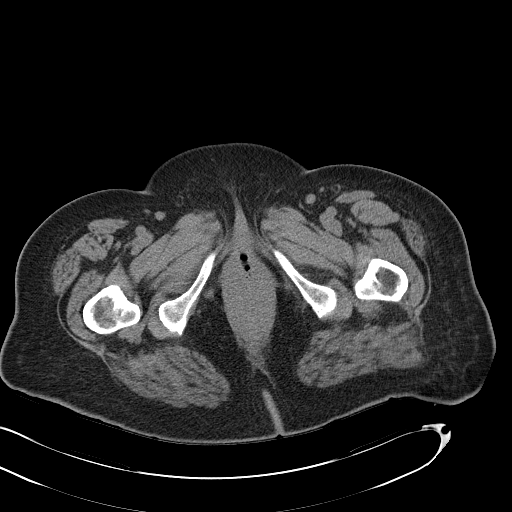
[im 4/87  bone]
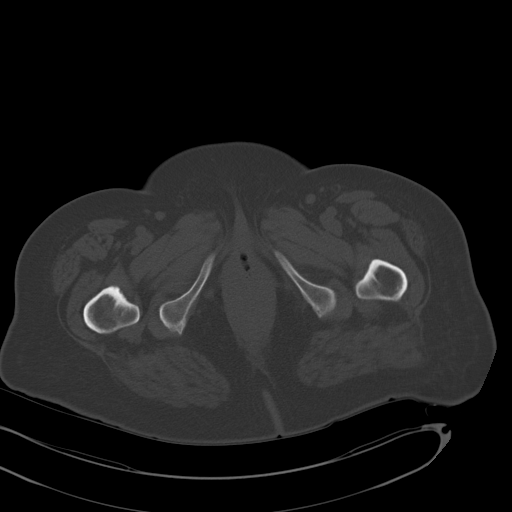
[im 10/87  soft-tissue]
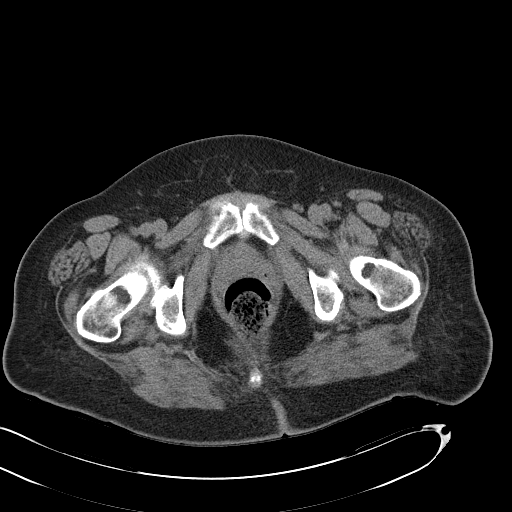
[im 17/87  soft-tissue]
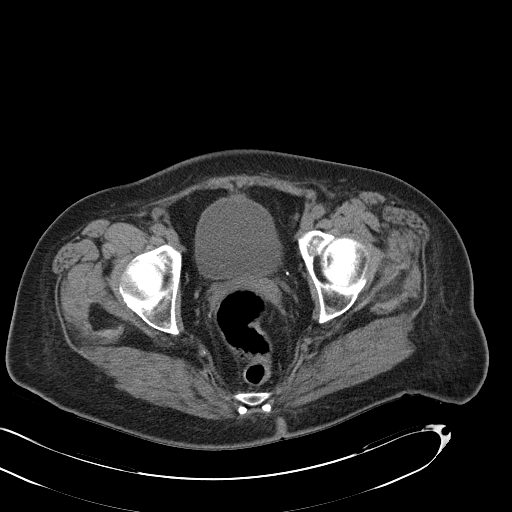
[im 24/87  soft-tissue]
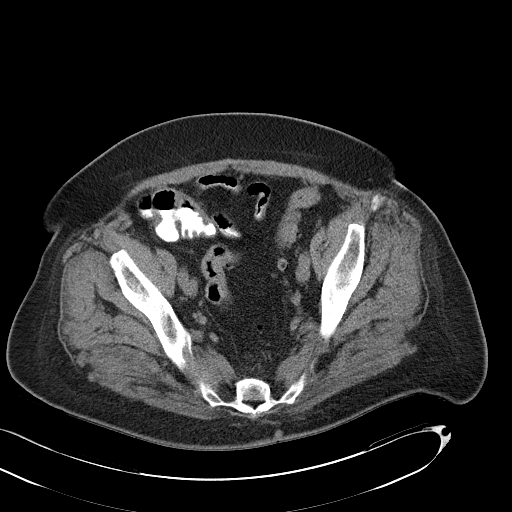
[im 30/87  soft-tissue]
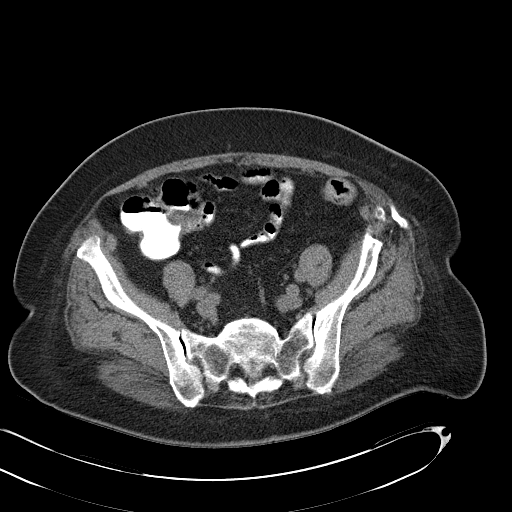
[im 34/87  soft-tissue]
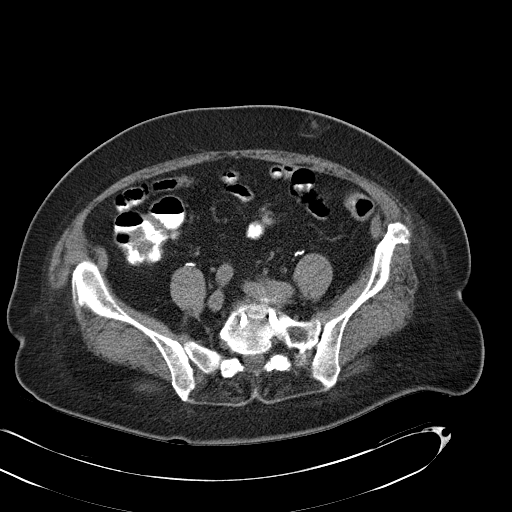
[im 40/87  soft-tissue]
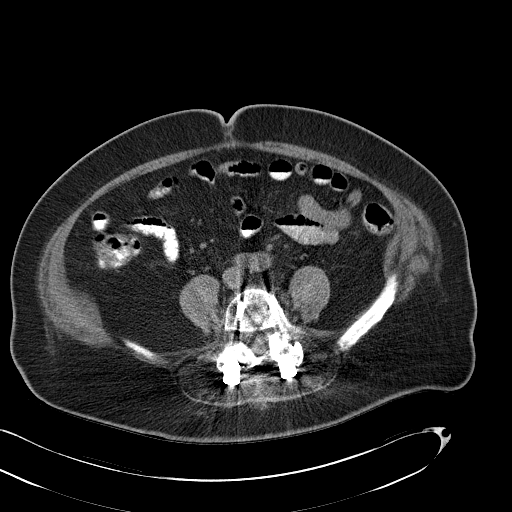
[im 47/87  soft-tissue]
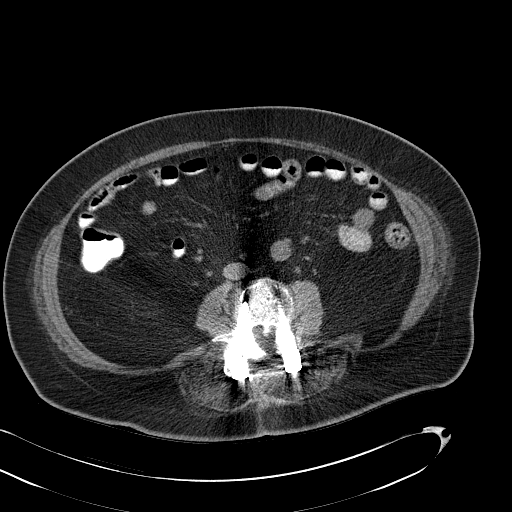
[im 53/87  soft-tissue]
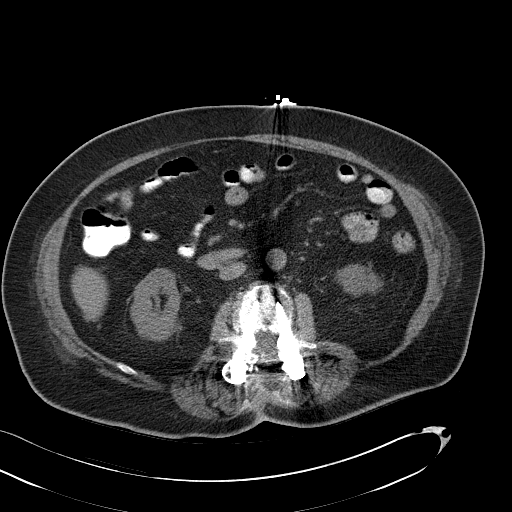
[im 53/87  bone]
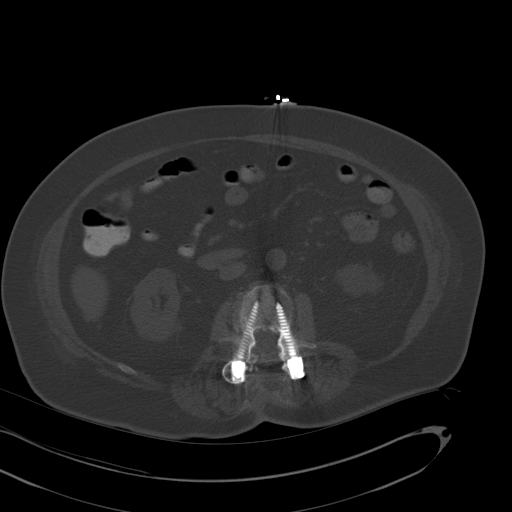
[im 57/87  soft-tissue]
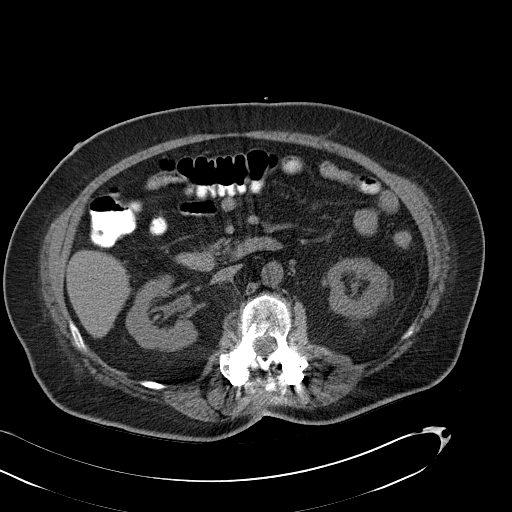
[im 63/87  soft-tissue]
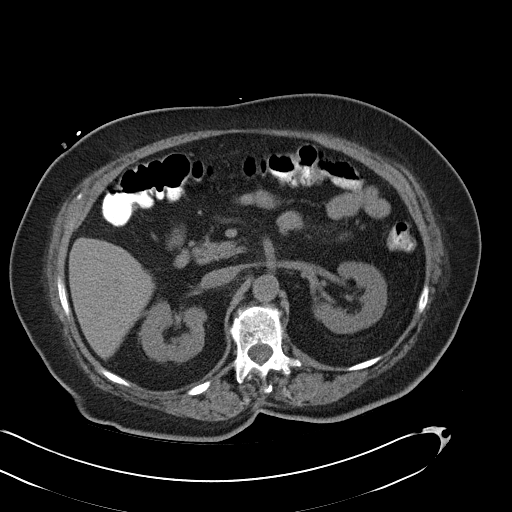
[im 70/87  soft-tissue]
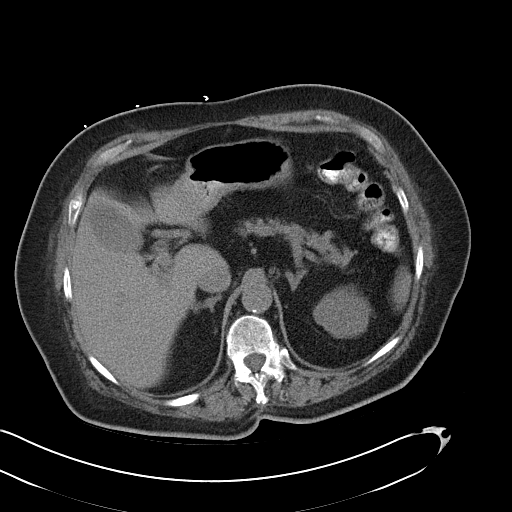
[im 77/87  soft-tissue]
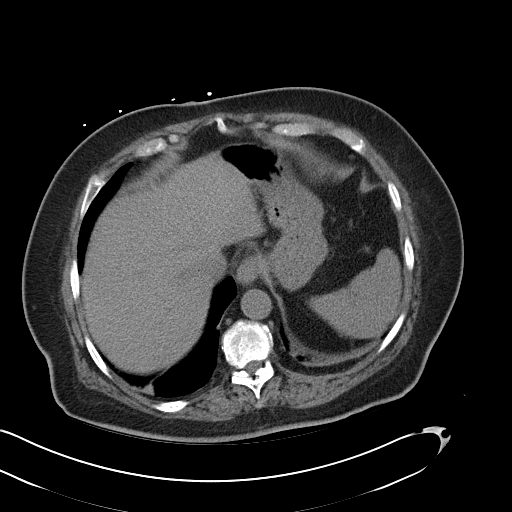
[im 83/87  soft-tissue]
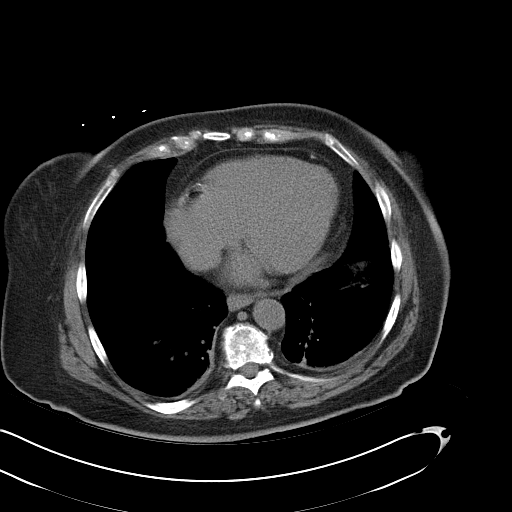

[Series 4: mpr cor 3.0mm · coronal · 0.78mm/px · 3 of 91 slices shown]
[im 31/91  soft-tissue]
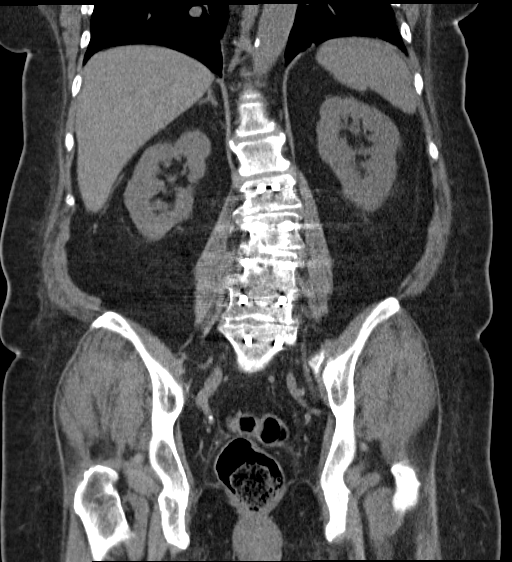
[im 41/91  soft-tissue]
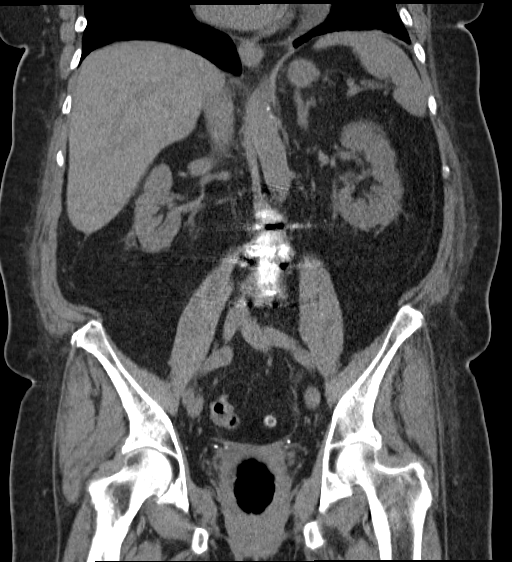
[im 51/91  soft-tissue]
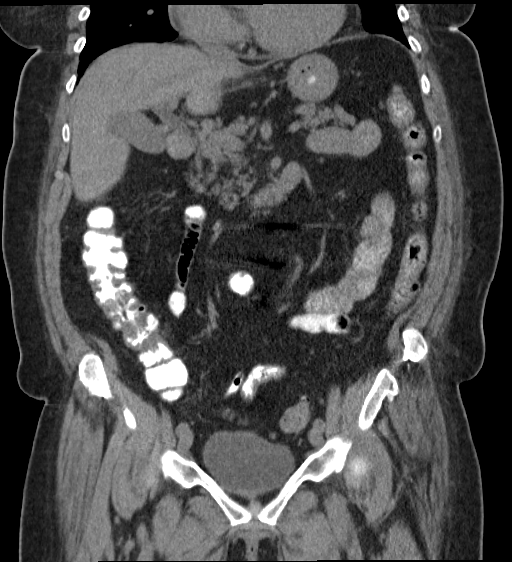

[17 of 46 positions shown; findings below may reference images not displayed]

FINDINGS: Lower chest: No acute findings. Stable cardiomegaly and tiny
pericardial effusion.

Hepatobiliary: No mass visualized on this un-enhanced exam.
Gallbladder is unremarkable.

Pancreas: No mass or inflammatory process identified on this
un-enhanced exam.

Spleen: Within normal limits in size.

Adrenals/Urinary Tract: No evidence of urolithiasis or
hydronephrosis. No definite mass visualized on this un-enhanced
exam.

Stomach/Bowel: No evidence of obstruction, inflammatory process, or
abnormal fluid collections. Sigmoid diverticulosis is again
demonstrated, without evidence of diverticulitis. Normal appendix
visualized.

Vascular/Lymphatic: No pathologically enlarged lymph nodes. No
evidence of abdominal aortic aneurysm.

Reproductive: Prior hysterectomy noted. Adnexal regions are
unremarkable in appearance.

Other: None.

Musculoskeletal: No suspicious bone lesions identified. Lumbar spine
fusion hardware again noted.
IMPRESSION: Sigmoid diverticulosis. No radiographic evidence of diverticulitis
or other acute findings.
